# Patient Record
Sex: Female | Born: 1955
Health system: Southern US, Community
[De-identification: ages and names within clinical notes are randomized; demographics above are authoritative.]

## PROBLEM LIST (undated history)

## (undated) DIAGNOSIS — N952 Postmenopausal atrophic vaginitis: Secondary | ICD-10-CM

## (undated) DIAGNOSIS — J45909 Unspecified asthma, uncomplicated: Secondary | ICD-10-CM

## (undated) DIAGNOSIS — A048 Other specified bacterial intestinal infections: Secondary | ICD-10-CM

## (undated) DIAGNOSIS — R7303 Prediabetes: Secondary | ICD-10-CM

## (undated) DIAGNOSIS — L80 Vitiligo: Secondary | ICD-10-CM

## (undated) DIAGNOSIS — E559 Vitamin D deficiency, unspecified: Secondary | ICD-10-CM

## (undated) DIAGNOSIS — Z8632 Personal history of gestational diabetes: Secondary | ICD-10-CM

## (undated) DIAGNOSIS — B4489 Other forms of aspergillosis: Secondary | ICD-10-CM

## (undated) DIAGNOSIS — IMO0001 Reserved for inherently not codable concepts without codable children: Secondary | ICD-10-CM

## (undated) DIAGNOSIS — M8580 Other specified disorders of bone density and structure, unspecified site: Secondary | ICD-10-CM

## (undated) DIAGNOSIS — K219 Gastro-esophageal reflux disease without esophagitis: Secondary | ICD-10-CM

## (undated) HISTORY — DX: Other specified disorders of bone density and structure, unspecified site: M85.80

## (undated) HISTORY — DX: Vitiligo: L80

## (undated) HISTORY — DX: Postmenopausal atrophic vaginitis: N95.2

## (undated) HISTORY — DX: Reserved for inherently not codable concepts without codable children: IMO0001

## (undated) HISTORY — DX: Prediabetes: R73.03

## (undated) HISTORY — DX: Other specified bacterial intestinal infections: A04.8

## (undated) HISTORY — DX: Other forms of aspergillosis: B44.89

## (undated) HISTORY — DX: Vitamin D deficiency, unspecified: E55.9

## (undated) HISTORY — DX: Gastro-esophageal reflux disease without esophagitis: K21.9

## (undated) HISTORY — DX: Personal history of gestational diabetes: Z86.32

---

## 1988-06-27 HISTORY — PX: BREAST CYST EXCISION: SHX579

## 1993-06-27 HISTORY — PX: TUBAL LIGATION: SHX77

## 1998-02-05 ENCOUNTER — Other Ambulatory Visit: Admission: RE | Admit: 1998-02-05 | Discharge: 1998-02-05 | Payer: Self-pay | Admitting: Obstetrics and Gynecology

## 1998-02-05 ENCOUNTER — Other Ambulatory Visit: Admission: RE | Admit: 1998-02-05 | Discharge: 1998-02-05 | Payer: Self-pay | Admitting: Gynecology

## 1998-12-18 ENCOUNTER — Encounter: Payer: Self-pay | Admitting: Orthopedic Surgery

## 1998-12-18 ENCOUNTER — Ambulatory Visit (HOSPITAL_COMMUNITY): Admission: RE | Admit: 1998-12-18 | Discharge: 1998-12-18 | Payer: Self-pay | Admitting: Orthopedic Surgery

## 1999-02-16 ENCOUNTER — Other Ambulatory Visit: Admission: RE | Admit: 1999-02-16 | Discharge: 1999-02-16 | Payer: Self-pay | Admitting: Gynecology

## 2000-04-24 ENCOUNTER — Encounter: Payer: Self-pay | Admitting: Gynecology

## 2000-04-24 ENCOUNTER — Encounter: Admission: RE | Admit: 2000-04-24 | Discharge: 2000-04-24 | Payer: Self-pay | Admitting: Gynecology

## 2000-06-05 ENCOUNTER — Other Ambulatory Visit: Admission: RE | Admit: 2000-06-05 | Discharge: 2000-06-05 | Payer: Self-pay | Admitting: Gynecology

## 2001-05-09 ENCOUNTER — Encounter: Payer: Self-pay | Admitting: Gynecology

## 2001-05-09 ENCOUNTER — Encounter: Admission: RE | Admit: 2001-05-09 | Discharge: 2001-05-09 | Payer: Self-pay | Admitting: Gynecology

## 2001-06-07 ENCOUNTER — Other Ambulatory Visit: Admission: RE | Admit: 2001-06-07 | Discharge: 2001-06-07 | Payer: Self-pay | Admitting: Gynecology

## 2002-06-12 ENCOUNTER — Encounter: Payer: Self-pay | Admitting: Gynecology

## 2002-06-12 ENCOUNTER — Encounter: Admission: RE | Admit: 2002-06-12 | Discharge: 2002-06-12 | Payer: Self-pay | Admitting: Gynecology

## 2002-06-25 ENCOUNTER — Other Ambulatory Visit: Admission: RE | Admit: 2002-06-25 | Discharge: 2002-06-25 | Payer: Self-pay | Admitting: Gynecology

## 2003-07-23 ENCOUNTER — Other Ambulatory Visit: Admission: RE | Admit: 2003-07-23 | Discharge: 2003-07-23 | Payer: Self-pay | Admitting: Gynecology

## 2003-10-08 ENCOUNTER — Encounter: Admission: RE | Admit: 2003-10-08 | Discharge: 2003-10-08 | Payer: Self-pay | Admitting: Gynecology

## 2004-08-31 ENCOUNTER — Other Ambulatory Visit: Admission: RE | Admit: 2004-08-31 | Discharge: 2004-08-31 | Payer: Self-pay | Admitting: Gynecology

## 2004-11-05 ENCOUNTER — Encounter: Admission: RE | Admit: 2004-11-05 | Discharge: 2004-11-05 | Payer: Self-pay | Admitting: Gynecology

## 2005-03-09 ENCOUNTER — Encounter: Admission: RE | Admit: 2005-03-09 | Discharge: 2005-03-09 | Payer: Self-pay | Admitting: Family Medicine

## 2005-04-28 ENCOUNTER — Encounter: Admission: RE | Admit: 2005-04-28 | Discharge: 2005-04-28 | Payer: Self-pay | Admitting: Otolaryngology

## 2005-09-12 ENCOUNTER — Other Ambulatory Visit: Admission: RE | Admit: 2005-09-12 | Discharge: 2005-09-12 | Payer: Self-pay | Admitting: Gynecology

## 2006-01-23 ENCOUNTER — Encounter: Admission: RE | Admit: 2006-01-23 | Discharge: 2006-01-23 | Payer: Self-pay | Admitting: Gynecology

## 2006-09-14 ENCOUNTER — Other Ambulatory Visit: Admission: RE | Admit: 2006-09-14 | Discharge: 2006-09-14 | Payer: Self-pay | Admitting: Gynecology

## 2007-02-01 ENCOUNTER — Encounter: Admission: RE | Admit: 2007-02-01 | Discharge: 2007-02-01 | Payer: Self-pay | Admitting: Gynecology

## 2007-06-28 HISTORY — PX: COLPOSCOPY: SHX161

## 2007-09-19 ENCOUNTER — Other Ambulatory Visit: Admission: RE | Admit: 2007-09-19 | Discharge: 2007-09-19 | Payer: Self-pay | Admitting: Gynecology

## 2007-12-19 ENCOUNTER — Other Ambulatory Visit: Admission: RE | Admit: 2007-12-19 | Discharge: 2007-12-19 | Payer: Self-pay | Admitting: Surgical Oncology

## 2008-02-04 ENCOUNTER — Encounter: Admission: RE | Admit: 2008-02-04 | Discharge: 2008-02-04 | Payer: Self-pay | Admitting: Gynecology

## 2008-07-07 ENCOUNTER — Ambulatory Visit: Payer: Self-pay | Admitting: Gynecology

## 2008-07-23 ENCOUNTER — Ambulatory Visit: Payer: Self-pay | Admitting: Gynecology

## 2008-10-08 ENCOUNTER — Encounter: Admission: RE | Admit: 2008-10-08 | Discharge: 2008-10-08 | Payer: Self-pay | Admitting: Family Medicine

## 2008-10-13 ENCOUNTER — Encounter: Payer: Self-pay | Admitting: Gynecology

## 2008-10-13 ENCOUNTER — Other Ambulatory Visit: Admission: RE | Admit: 2008-10-13 | Discharge: 2008-10-13 | Payer: Self-pay | Admitting: Gynecology

## 2008-10-13 ENCOUNTER — Ambulatory Visit: Payer: Self-pay | Admitting: Gynecology

## 2009-02-06 ENCOUNTER — Encounter: Admission: RE | Admit: 2009-02-06 | Discharge: 2009-02-06 | Payer: Self-pay | Admitting: Family Medicine

## 2009-03-26 ENCOUNTER — Encounter: Admission: RE | Admit: 2009-03-26 | Discharge: 2009-03-26 | Payer: Self-pay | Admitting: Gynecology

## 2009-11-16 ENCOUNTER — Ambulatory Visit: Payer: Self-pay | Admitting: Gynecology

## 2009-11-16 ENCOUNTER — Other Ambulatory Visit: Admission: RE | Admit: 2009-11-16 | Discharge: 2009-11-16 | Payer: Self-pay | Admitting: Gynecology

## 2010-03-29 ENCOUNTER — Encounter: Admission: RE | Admit: 2010-03-29 | Discharge: 2010-03-29 | Payer: Self-pay | Admitting: Gynecology

## 2010-05-10 ENCOUNTER — Ambulatory Visit: Payer: Self-pay | Admitting: Gynecology

## 2010-05-17 ENCOUNTER — Ambulatory Visit: Payer: Self-pay | Admitting: Gynecology

## 2010-05-21 ENCOUNTER — Inpatient Hospital Stay (HOSPITAL_COMMUNITY)
Admission: EM | Admit: 2010-05-21 | Discharge: 2010-05-25 | Payer: Self-pay | Source: Home / Self Care | Admitting: Emergency Medicine

## 2010-06-07 ENCOUNTER — Encounter
Admission: RE | Admit: 2010-06-07 | Discharge: 2010-06-07 | Payer: Self-pay | Source: Home / Self Care | Attending: Family Medicine | Admitting: Family Medicine

## 2010-09-07 LAB — CBC
HCT: 34.9 % — ABNORMAL LOW (ref 36.0–46.0)
HCT: 35.4 % — ABNORMAL LOW (ref 36.0–46.0)
HCT: 35.7 % — ABNORMAL LOW (ref 36.0–46.0)
HCT: 40.7 % (ref 36.0–46.0)
Hemoglobin: 11.4 g/dL — ABNORMAL LOW (ref 12.0–15.0)
Hemoglobin: 11.7 g/dL — ABNORMAL LOW (ref 12.0–15.0)
Hemoglobin: 11.8 g/dL — ABNORMAL LOW (ref 12.0–15.0)
Hemoglobin: 13.9 g/dL (ref 12.0–15.0)
MCH: 29.6 pg (ref 26.0–34.0)
MCH: 29.8 pg (ref 26.0–34.0)
MCH: 29.9 pg (ref 26.0–34.0)
MCH: 30.4 pg (ref 26.0–34.0)
MCHC: 32.7 g/dL (ref 30.0–36.0)
MCHC: 33.1 g/dL (ref 30.0–36.0)
MCHC: 33.1 g/dL (ref 30.0–36.0)
MCHC: 34.2 g/dL (ref 30.0–36.0)
MCV: 89.1 fL (ref 78.0–100.0)
MCV: 89.7 fL (ref 78.0–100.0)
MCV: 90.5 fL (ref 78.0–100.0)
MCV: 91.4 fL (ref 78.0–100.0)
Platelets: 265 10*3/uL (ref 150–400)
Platelets: 289 10*3/uL (ref 150–400)
Platelets: 297 10*3/uL (ref 150–400)
Platelets: 328 10*3/uL (ref 150–400)
RBC: 3.82 MIL/uL — ABNORMAL LOW (ref 3.87–5.11)
RBC: 3.91 MIL/uL (ref 3.87–5.11)
RBC: 3.98 MIL/uL (ref 3.87–5.11)
RBC: 4.57 MIL/uL (ref 3.87–5.11)
RDW: 12.3 % (ref 11.5–15.5)
RDW: 12.4 % (ref 11.5–15.5)
RDW: 12.5 % (ref 11.5–15.5)
RDW: 12.7 % (ref 11.5–15.5)
WBC: 3 10*3/uL — ABNORMAL LOW (ref 4.0–10.5)
WBC: 4.9 10*3/uL (ref 4.0–10.5)
WBC: 5.2 10*3/uL (ref 4.0–10.5)
WBC: 7.1 10*3/uL (ref 4.0–10.5)

## 2010-09-07 LAB — DIFFERENTIAL
Basophils Absolute: 0 10*3/uL (ref 0.0–0.1)
Basophils Relative: 1 % (ref 0–1)
Eosinophils Absolute: 0 10*3/uL (ref 0.0–0.7)
Eosinophils Relative: 0 % (ref 0–5)
Lymphocytes Relative: 9 % — ABNORMAL LOW (ref 12–46)
Lymphs Abs: 0.7 10*3/uL (ref 0.7–4.0)
Monocytes Absolute: 0.6 10*3/uL (ref 0.1–1.0)
Monocytes Relative: 8 % (ref 3–12)
Neutro Abs: 5.8 10*3/uL (ref 1.7–7.7)
Neutrophils Relative %: 81 % — ABNORMAL HIGH (ref 43–77)

## 2010-09-07 LAB — BASIC METABOLIC PANEL
BUN: 1 mg/dL — ABNORMAL LOW (ref 6–23)
BUN: 3 mg/dL — ABNORMAL LOW (ref 6–23)
BUN: 4 mg/dL — ABNORMAL LOW (ref 6–23)
BUN: 6 mg/dL (ref 6–23)
BUN: 7 mg/dL (ref 6–23)
CO2: 28 mEq/L (ref 19–32)
CO2: 30 mEq/L (ref 19–32)
CO2: 31 mEq/L (ref 19–32)
CO2: 32 mEq/L (ref 19–32)
CO2: 32 mEq/L (ref 19–32)
Calcium: 8.3 mg/dL — ABNORMAL LOW (ref 8.4–10.5)
Calcium: 8.4 mg/dL (ref 8.4–10.5)
Calcium: 8.5 mg/dL (ref 8.4–10.5)
Calcium: 8.7 mg/dL (ref 8.4–10.5)
Calcium: 8.8 mg/dL (ref 8.4–10.5)
Chloride: 104 mEq/L (ref 96–112)
Chloride: 105 mEq/L (ref 96–112)
Chloride: 105 mEq/L (ref 96–112)
Chloride: 108 mEq/L (ref 96–112)
Chloride: 97 mEq/L (ref 96–112)
Creatinine, Ser: 0.5 mg/dL (ref 0.4–1.2)
Creatinine, Ser: 0.56 mg/dL (ref 0.4–1.2)
Creatinine, Ser: 0.59 mg/dL (ref 0.4–1.2)
Creatinine, Ser: 0.6 mg/dL (ref 0.4–1.2)
Creatinine, Ser: 0.63 mg/dL (ref 0.4–1.2)
GFR calc Af Amer: >60 mL/min (ref >60)
GFR calc Af Amer: >60 mL/min (ref >60)
GFR calc Af Amer: >60 mL/min (ref >60)
GFR calc Af Amer: >60 mL/min (ref >60)
GFR calc Af Amer: >60 mL/min (ref >60)
GFR calc non Af Amer: >60 mL/min (ref >60)
GFR calc non Af Amer: >60 mL/min (ref >60)
GFR calc non Af Amer: >60 mL/min (ref >60)
GFR calc non Af Amer: >60 mL/min (ref >60)
GFR calc non Af Amer: >60 mL/min (ref >60)
Glucose, Bld: 101 mg/dL — ABNORMAL HIGH (ref 70–99)
Glucose, Bld: 106 mg/dL — ABNORMAL HIGH (ref 70–99)
Glucose, Bld: 112 mg/dL — ABNORMAL HIGH (ref 70–99)
Glucose, Bld: 112 mg/dL — ABNORMAL HIGH (ref 70–99)
Glucose, Bld: 116 mg/dL — ABNORMAL HIGH (ref 70–99)
Potassium: 3.4 mEq/L — ABNORMAL LOW (ref 3.5–5.1)
Potassium: 4 mEq/L (ref 3.5–5.1)
Potassium: 4 mEq/L (ref 3.5–5.1)
Potassium: 4 mEq/L (ref 3.5–5.1)
Potassium: 4.2 mEq/L (ref 3.5–5.1)
Sodium: 137 mEq/L (ref 135–145)
Sodium: 139 mEq/L (ref 135–145)
Sodium: 140 mEq/L (ref 135–145)
Sodium: 140 mEq/L (ref 135–145)
Sodium: 142 mEq/L (ref 135–145)

## 2010-09-07 LAB — URINALYSIS, ROUTINE W REFLEX MICROSCOPIC
Bilirubin Urine: NEGATIVE
Glucose, UA: NEGATIVE mg/dL
Ketones, ur: NEGATIVE mg/dL
Leukocytes, UA: NEGATIVE
Nitrite: NEGATIVE
Protein, ur: 30 mg/dL — AB
Specific Gravity, Urine: 1.019 (ref 1.005–1.030)
Urobilinogen, UA: 1 mg/dL (ref 0.0–1.0)
pH: 6 (ref 5.0–8.0)

## 2010-09-07 LAB — MAGNESIUM
Magnesium: 2.2 mg/dL (ref 1.5–2.5)
Magnesium: 2.3 mg/dL (ref 1.5–2.5)
Magnesium: 2.4 mg/dL (ref 1.5–2.5)

## 2010-09-07 LAB — HEPATIC FUNCTION PANEL
ALT: 14 U/L (ref 0–35)
AST: 12 U/L (ref 0–37)
Albumin: 2.8 g/dL — ABNORMAL LOW (ref 3.5–5.2)
Alkaline Phosphatase: 50 U/L (ref 39–117)
Bilirubin, Direct: 0.1 mg/dL (ref 0.0–0.3)
Indirect Bilirubin: 0.2 mg/dL — ABNORMAL LOW (ref 0.3–0.9)
Total Bilirubin: 0.3 mg/dL (ref 0.3–1.2)
Total Protein: 6.1 g/dL (ref 6.0–8.3)

## 2010-09-07 LAB — URINE CULTURE
Colony Count: NO GROWTH
Culture  Setup Time: 201111251751
Culture: NO GROWTH

## 2010-09-07 LAB — URINE MICROSCOPIC-ADD ON

## 2010-09-07 LAB — MRSA PCR SCREENING: MRSA by PCR: NEGATIVE

## 2010-09-07 LAB — LIPASE, BLOOD: Lipase: 32 U/L (ref 11–59)

## 2010-11-24 ENCOUNTER — Other Ambulatory Visit (HOSPITAL_COMMUNITY)
Admission: RE | Admit: 2010-11-24 | Discharge: 2010-11-24 | Disposition: A | Discharge status: Home / Self Care | Payer: BC Managed Care – PPO | Source: Ambulatory Visit | Attending: Gynecology | Admitting: Gynecology

## 2010-11-24 ENCOUNTER — Encounter (INDEPENDENT_AMBULATORY_CARE_PROVIDER_SITE_OTHER): Payer: BC Managed Care – PPO | Admitting: Women's Health

## 2010-11-24 ENCOUNTER — Other Ambulatory Visit: Payer: Self-pay | Admitting: Women's Health

## 2010-11-24 DIAGNOSIS — E079 Disorder of thyroid, unspecified: Secondary | ICD-10-CM

## 2010-11-24 DIAGNOSIS — Z124 Encounter for screening for malignant neoplasm of cervix: Secondary | ICD-10-CM | POA: Insufficient documentation

## 2010-11-24 DIAGNOSIS — R823 Hemoglobinuria: Secondary | ICD-10-CM

## 2010-11-24 DIAGNOSIS — Z01419 Encounter for gynecological examination (general) (routine) without abnormal findings: Secondary | ICD-10-CM

## 2010-11-24 DIAGNOSIS — Z13220 Encounter for screening for lipoid disorders: Secondary | ICD-10-CM

## 2010-11-24 DIAGNOSIS — Z833 Family history of diabetes mellitus: Secondary | ICD-10-CM

## 2010-12-09 ENCOUNTER — Encounter (INDEPENDENT_AMBULATORY_CARE_PROVIDER_SITE_OTHER): Payer: BC Managed Care – PPO

## 2010-12-09 DIAGNOSIS — M899 Disorder of bone, unspecified: Secondary | ICD-10-CM

## 2010-12-10 ENCOUNTER — Other Ambulatory Visit (INDEPENDENT_AMBULATORY_CARE_PROVIDER_SITE_OTHER): Payer: BC Managed Care – PPO

## 2010-12-10 DIAGNOSIS — E559 Vitamin D deficiency, unspecified: Secondary | ICD-10-CM

## 2010-12-21 ENCOUNTER — Institutional Professional Consult (permissible substitution) (INDEPENDENT_AMBULATORY_CARE_PROVIDER_SITE_OTHER): Payer: BC Managed Care – PPO | Admitting: Gynecology

## 2010-12-21 DIAGNOSIS — M899 Disorder of bone, unspecified: Secondary | ICD-10-CM

## 2010-12-21 DIAGNOSIS — M949 Disorder of cartilage, unspecified: Secondary | ICD-10-CM

## 2011-01-07 ENCOUNTER — Other Ambulatory Visit: Payer: BC Managed Care – PPO

## 2011-04-18 ENCOUNTER — Other Ambulatory Visit: Payer: Self-pay | Admitting: Gynecology

## 2011-04-18 DIAGNOSIS — Z1231 Encounter for screening mammogram for malignant neoplasm of breast: Secondary | ICD-10-CM

## 2011-05-02 ENCOUNTER — Ambulatory Visit
Admission: RE | Admit: 2011-05-02 | Discharge: 2011-05-02 | Disposition: A | Discharge status: Home / Self Care | Payer: BC Managed Care – PPO | Source: Ambulatory Visit | Attending: Gynecology | Admitting: Gynecology

## 2011-05-02 DIAGNOSIS — Z1231 Encounter for screening mammogram for malignant neoplasm of breast: Secondary | ICD-10-CM

## 2011-08-07 IMAGING — CR DG CHEST 2V
2 series · 2 of 2 positions shown · non-contrast
Comparison: Chest x-ray of 05/24/2010

CLINICAL DATA: History pneumonia, follow-up

CHEST - 2 VIEW

[view not recorded (1 of 2)]
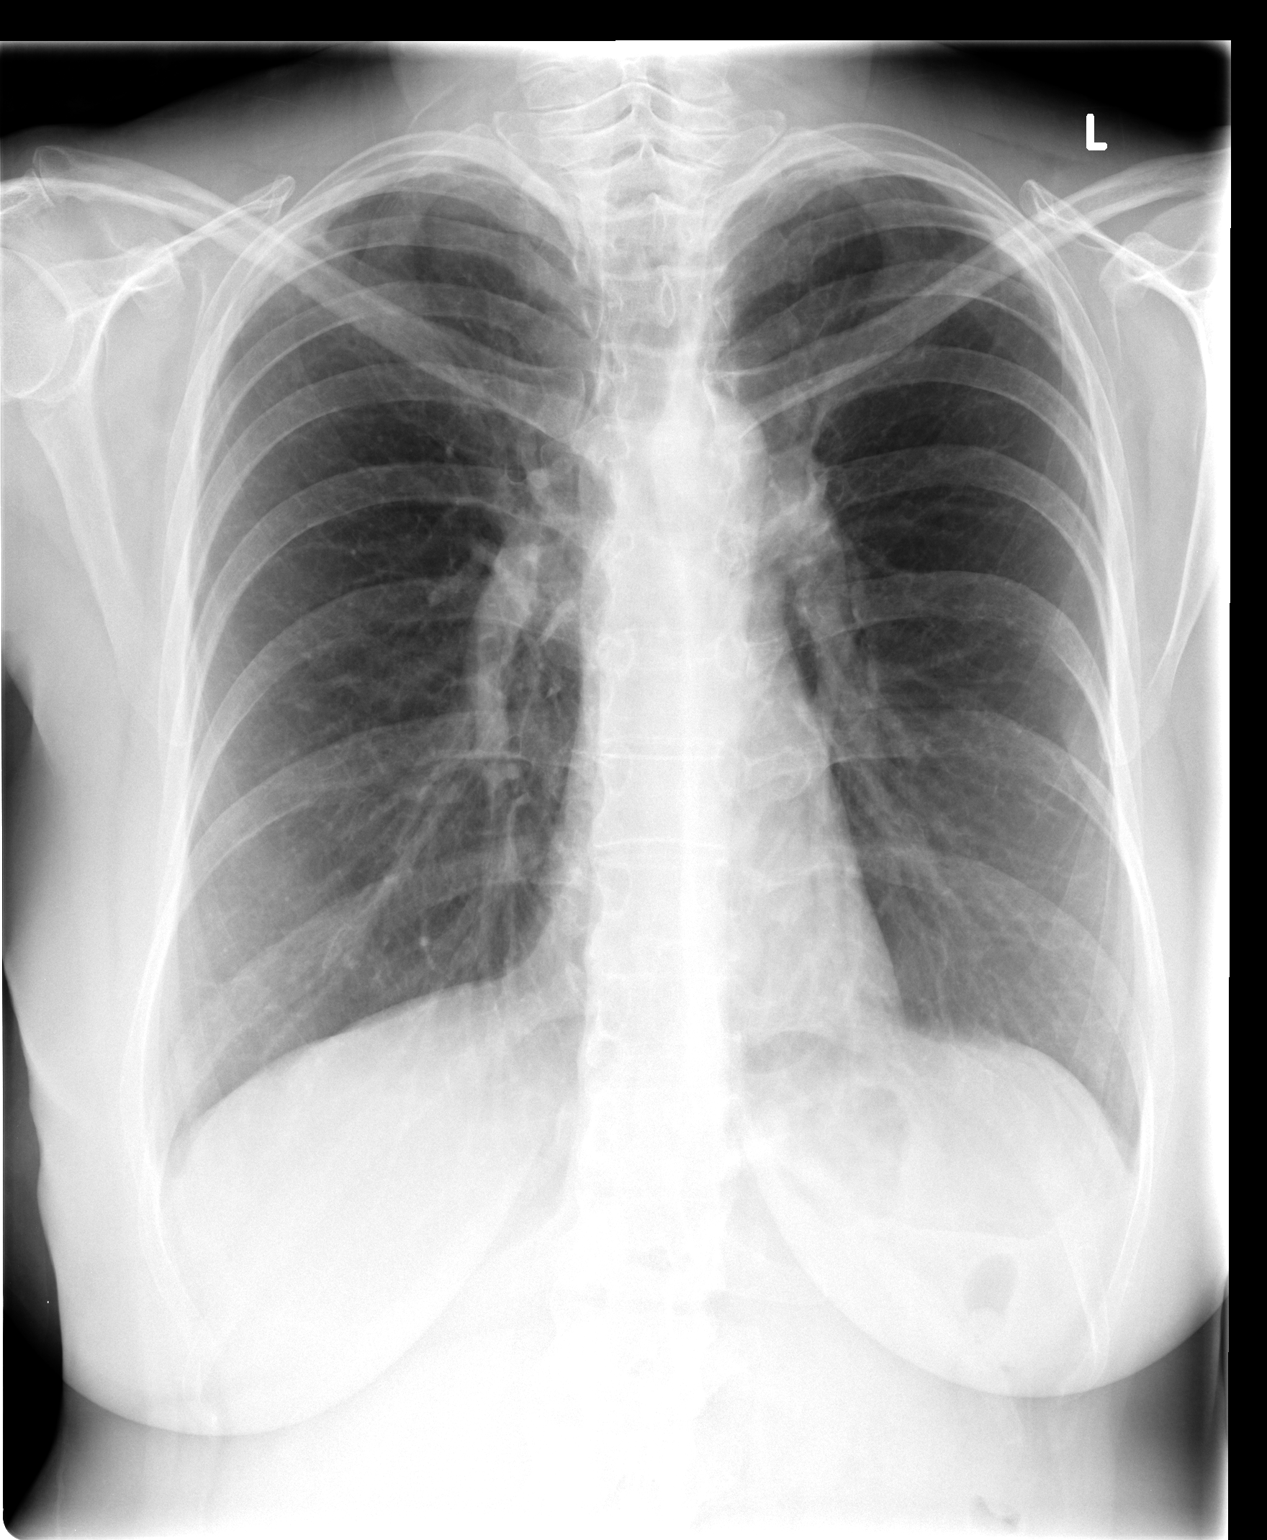

[view not recorded (2 of 2)]
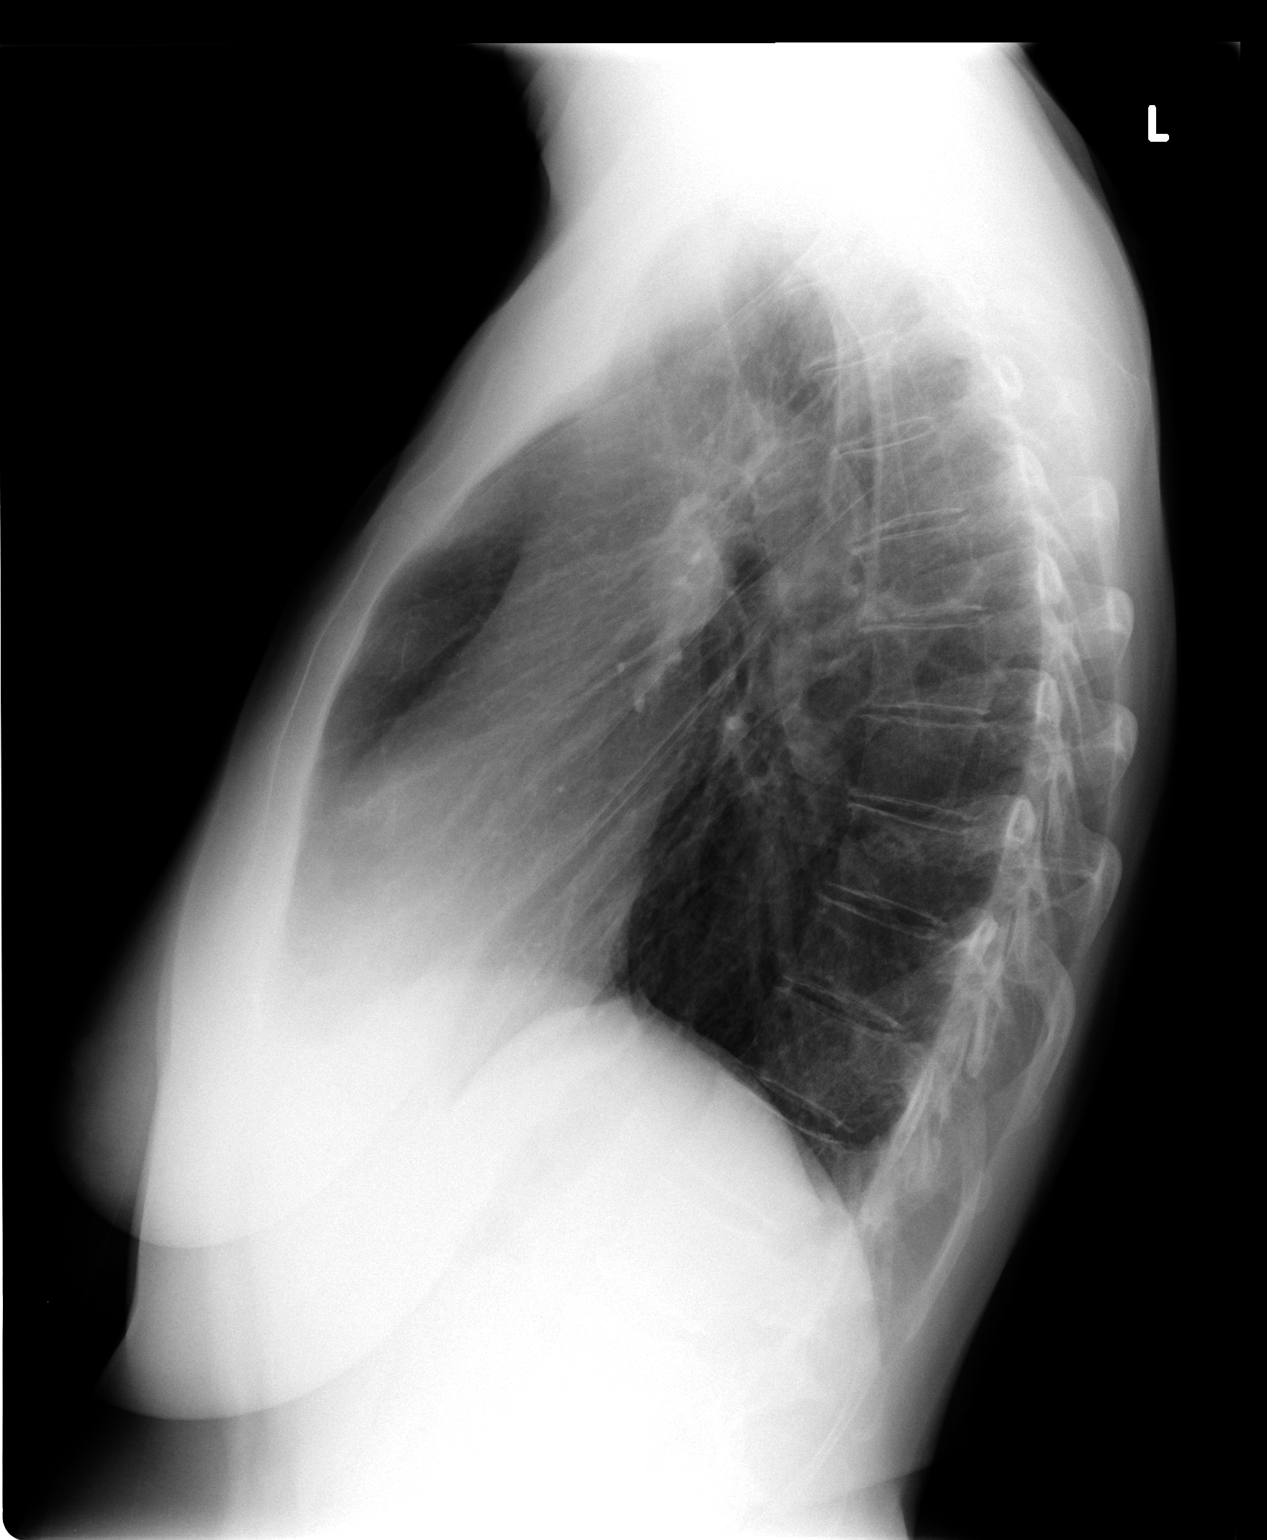

[2 of 2 positions shown; findings below may reference images not displayed]

FINDINGS: The lungs are now clear.  Vague opacity at the right lung
base has cleared as have the small pleural effusions.  Mediastinal
contours are stable.  Heart size is stable.  No bony abnormality is
seen.
IMPRESSION: Clearing of basilar opacity and small effusions.  No active lung
disease.

## 2012-02-03 ENCOUNTER — Encounter: Payer: BC Managed Care – PPO | Admitting: Women's Health

## 2012-02-08 ENCOUNTER — Ambulatory Visit (INDEPENDENT_AMBULATORY_CARE_PROVIDER_SITE_OTHER): Payer: BC Managed Care – PPO | Admitting: Women's Health

## 2012-02-08 ENCOUNTER — Encounter: Payer: Self-pay | Admitting: Gynecology

## 2012-02-08 ENCOUNTER — Encounter: Payer: Self-pay | Admitting: Women's Health

## 2012-02-08 VITALS — BP 106/70 | Ht 67.75 in | Wt 157.0 lb

## 2012-02-08 DIAGNOSIS — Z01419 Encounter for gynecological examination (general) (routine) without abnormal findings: Secondary | ICD-10-CM

## 2012-02-08 DIAGNOSIS — E079 Disorder of thyroid, unspecified: Secondary | ICD-10-CM

## 2012-02-08 DIAGNOSIS — Z13220 Encounter for screening for lipoid disorders: Secondary | ICD-10-CM

## 2012-02-08 DIAGNOSIS — Z833 Family history of diabetes mellitus: Secondary | ICD-10-CM

## 2012-02-08 LAB — CBC WITH DIFFERENTIAL/PLATELET
Basophils Absolute: 0.1 10*3/uL (ref 0.0–0.1)
Basophils Relative: 1 % (ref 0–1)
Eosinophils Absolute: 0.2 10*3/uL (ref 0.0–0.7)
Eosinophils Relative: 5 % (ref 0–5)
HCT: 40.5 % (ref 36.0–46.0)
Hemoglobin: 13.8 g/dL (ref 12.0–15.0)
Lymphocytes Relative: 31 % (ref 12–46)
Lymphs Abs: 1.2 10*3/uL (ref 0.7–4.0)
MCH: 30.1 pg (ref 26.0–34.0)
MCHC: 34.1 g/dL (ref 30.0–36.0)
MCV: 88.4 fL (ref 78.0–100.0)
Monocytes Absolute: 0.5 10*3/uL (ref 0.1–1.0)
Monocytes Relative: 12 % (ref 3–12)
Neutro Abs: 2 10*3/uL (ref 1.7–7.7)
Neutrophils Relative %: 51 % (ref 43–77)
Platelets: 351 10*3/uL (ref 150–400)
RBC: 4.58 MIL/uL (ref 3.87–5.11)
RDW: 13.2 % (ref 11.5–15.5)
WBC: 4 10*3/uL (ref 4.0–10.5)

## 2012-02-08 LAB — LIPID PANEL
Cholesterol: 218 mg/dL — ABNORMAL HIGH (ref 0–200)
HDL: 71 mg/dL (ref >39)
LDL Cholesterol: 107 mg/dL — ABNORMAL HIGH (ref 0–99)
Total CHOL/HDL Ratio: 3.1 Ratio
Triglycerides: 200 mg/dL — ABNORMAL HIGH (ref <150)
VLDL: 40 mg/dL (ref 0–40)

## 2012-02-08 LAB — GLUCOSE, RANDOM: Glucose, Bld: 69 mg/dL — ABNORMAL LOW (ref 70–99)

## 2012-02-08 NOTE — Progress Notes (Signed)
Kathryn Griffin May 12, 1956 [id number]    History:    The patient presents for annual exam.  Postmenopausal on no HRT with no bleeding. History of normal mammograms and Paps. Normal colonoscopy in 2010. DEXA 2012 osteopenia femoral neck neck T score -1.5. Problem of thinning eyebrows for several years, brother also has problem. No hair loss on head.  Had a normal TSH 2012.   Past medical history, past surgical history, family history and social history were all reviewed and documented in the EPIC chart. 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 Son Beaver graduated from [employer], daughter Social research officer, government at [employer].   ROS:  A  ROS was performed and pertinent positives and negatives are included in the history.  Exam:  Filed Vitals:   02/08/12 0928  BP: 106/70    General appearance:  Normal Head/Neck:  Normal, without cervical or supraclavicular adenopathy. Thyroid:  Symmetrical, normal in size, without palpable masses or nodularity. Respiratory  Effort:  Normal  Auscultation:  Clear without wheezing or rhonchi Cardiovascular  Auscultation:  Regular rate, without rubs, murmurs or gallops  Edema/varicosities:  Not grossly evident Abdominal  Soft,nontender, without masses, guarding or rebound.  Liver/spleen:  No organomegaly noted  Hernia:  None appreciated  Skin  Inspection:  Grossly normal  Palpation:  Grossly normal Neurologic/psychiatric  Orientation:  Normal with appropriate conversation.  Mood/affect:  Normal  Genitourinary    Breasts: Examined lying and sitting. Right breast larger than left always    Right: Without masses, retractions, discharge or axillary adenopathy.     Left: Without masses, retractions, discharge or axillary adenopathy.   Inguinal/mons:  Normal without inguinal adenopathy  External genitalia:  Normal  BUS/Urethra/Skene's glands:  Normal  Bladder:  Normal  Vagina:  Normal  Cervix:  Normal  Uterus:   normal in size, shape and  contour.  Midline and mobile  Adnexa/parametria:     Rt: Without masses or tenderness.   Lt: Without masses or tenderness.  Anus and perineum: Normal  Digital rectal exam: Normal sphincter tone without palpated masses or tenderness  Assessment/Plan:  56 y.o. M. WF G2 P2 for annual exam with no complaints.  Normal postmenopausal exam Osteopenia T score -1.5 femoral neck 2012  Plan: CBC, TSH, glucose, lipid panel, UA. Home Hemoccult card given. No Pap, history of normal Paps, new screening guidelines reviewed. SBE's, continue annual mammogram, calcium rich diet, vitamin D 2000 daily encouraged. Home safety and fall prevention reviewed, reviewed importance of regular exercise.    Harrington Challenger WHNP, 10:14 AM 02/08/2012

## 2012-02-08 NOTE — Patient Instructions (Signed)

## 2012-02-09 ENCOUNTER — Other Ambulatory Visit: Payer: Self-pay | Admitting: *Deleted

## 2012-02-09 DIAGNOSIS — E7800 Pure hypercholesterolemia, unspecified: Secondary | ICD-10-CM

## 2012-02-09 LAB — URINALYSIS W MICROSCOPIC + REFLEX CULTURE
Bacteria, UA: NONE SEEN
Bilirubin Urine: NEGATIVE
Casts: NONE SEEN
Crystals: NONE SEEN
Glucose, UA: NEGATIVE mg/dL
Hgb urine dipstick: NEGATIVE
Ketones, ur: NEGATIVE mg/dL
Leukocytes, UA: NEGATIVE
Nitrite: NEGATIVE
Protein, ur: NEGATIVE mg/dL
Specific Gravity, Urine: 1.014 (ref 1.005–1.030)
Squamous Epithelial / LPF: NONE SEEN
Urobilinogen, UA: 0.2 mg/dL (ref 0.0–1.0)
pH: 6 (ref 5.0–8.0)

## 2012-02-09 LAB — TSH: TSH: 3.314 u[IU]/mL (ref 0.350–4.500)

## 2012-02-14 ENCOUNTER — Encounter: Payer: Self-pay | Admitting: Gynecology

## 2012-02-14 ENCOUNTER — Other Ambulatory Visit: Payer: Self-pay | Admitting: *Deleted

## 2012-02-14 DIAGNOSIS — Z1211 Encounter for screening for malignant neoplasm of colon: Secondary | ICD-10-CM

## 2012-02-15 ENCOUNTER — Other Ambulatory Visit: Payer: Self-pay | Admitting: Gynecology

## 2012-02-15 DIAGNOSIS — Z1211 Encounter for screening for malignant neoplasm of colon: Secondary | ICD-10-CM

## 2012-05-07 ENCOUNTER — Encounter: Payer: Self-pay | Admitting: Women's Health

## 2012-06-06 ENCOUNTER — Other Ambulatory Visit: Payer: Self-pay | Admitting: Gynecology

## 2012-06-06 DIAGNOSIS — Z1231 Encounter for screening mammogram for malignant neoplasm of breast: Secondary | ICD-10-CM

## 2012-07-09 ENCOUNTER — Ambulatory Visit
Admission: RE | Admit: 2012-07-09 | Discharge: 2012-07-09 | Disposition: A | Discharge status: Home / Self Care | Payer: BC Managed Care – PPO | Source: Ambulatory Visit | Attending: Gynecology | Admitting: Gynecology

## 2012-07-09 DIAGNOSIS — Z1231 Encounter for screening mammogram for malignant neoplasm of breast: Secondary | ICD-10-CM

## 2012-08-21 ENCOUNTER — Other Ambulatory Visit: Payer: Self-pay | Admitting: Family Medicine

## 2012-08-21 ENCOUNTER — Ambulatory Visit
Admission: RE | Admit: 2012-08-21 | Discharge: 2012-08-21 | Disposition: A | Discharge status: Home / Self Care | Payer: BC Managed Care – PPO | Source: Ambulatory Visit | Attending: Family Medicine | Admitting: Family Medicine

## 2012-08-21 DIAGNOSIS — R05 Cough: Secondary | ICD-10-CM

## 2012-08-21 DIAGNOSIS — R059 Cough, unspecified: Secondary | ICD-10-CM

## 2012-08-21 DIAGNOSIS — R0602 Shortness of breath: Secondary | ICD-10-CM

## 2012-08-21 DIAGNOSIS — R062 Wheezing: Secondary | ICD-10-CM

## 2012-10-01 ENCOUNTER — Ambulatory Visit
Admission: RE | Admit: 2012-10-01 | Discharge: 2012-10-01 | Disposition: A | Discharge status: Home / Self Care | Payer: BC Managed Care – PPO | Source: Ambulatory Visit | Attending: Family Medicine | Admitting: Family Medicine

## 2012-10-01 ENCOUNTER — Other Ambulatory Visit: Payer: Self-pay | Admitting: Family Medicine

## 2012-10-01 DIAGNOSIS — R9389 Abnormal findings on diagnostic imaging of other specified body structures: Secondary | ICD-10-CM

## 2012-10-11 ENCOUNTER — Institutional Professional Consult (permissible substitution): Payer: BC Managed Care – PPO | Admitting: Internal Medicine

## 2012-10-15 ENCOUNTER — Encounter: Payer: Self-pay | Admitting: *Deleted

## 2012-10-15 ENCOUNTER — Ambulatory Visit (INDEPENDENT_AMBULATORY_CARE_PROVIDER_SITE_OTHER): Payer: BC Managed Care – PPO | Admitting: Internal Medicine

## 2012-10-15 ENCOUNTER — Encounter: Payer: Self-pay | Admitting: Internal Medicine

## 2012-10-15 ENCOUNTER — Other Ambulatory Visit (INDEPENDENT_AMBULATORY_CARE_PROVIDER_SITE_OTHER): Payer: BC Managed Care – PPO

## 2012-10-15 VITALS — BP 110/78 | HR 110 | Temp 98.0°F | Ht 67.0 in | Wt 161.0 lb

## 2012-10-15 DIAGNOSIS — R05 Cough: Secondary | ICD-10-CM

## 2012-10-15 DIAGNOSIS — R059 Cough, unspecified: Secondary | ICD-10-CM

## 2012-10-15 DIAGNOSIS — Z23 Encounter for immunization: Secondary | ICD-10-CM

## 2012-10-15 LAB — CBC WITH DIFFERENTIAL/PLATELET
Basophils Absolute: 0.1 10*3/uL (ref 0.0–0.1)
Basophils Relative: 1.2 % (ref 0.0–3.0)
Eosinophils Absolute: 0.6 10*3/uL (ref 0.0–0.7)
Eosinophils Relative: 10.8 % — ABNORMAL HIGH (ref 0.0–5.0)
HCT: 40.4 % (ref 36.0–46.0)
Hemoglobin: 13.8 g/dL (ref 12.0–15.0)
Lymphocytes Relative: 21.5 % (ref 12.0–46.0)
Lymphs Abs: 1.2 10*3/uL (ref 0.7–4.0)
MCHC: 34.1 g/dL (ref 30.0–36.0)
MCV: 89.5 fl (ref 78.0–100.0)
Monocytes Absolute: 0.5 10*3/uL (ref 0.1–1.0)
Monocytes Relative: 9.4 % (ref 3.0–12.0)
Neutro Abs: 3.1 10*3/uL (ref 1.4–7.7)
Neutrophils Relative %: 57.1 % (ref 43.0–77.0)
Platelets: 300 10*3/uL (ref 150.0–400.0)
RBC: 4.52 Mil/uL (ref 3.87–5.11)
RDW: 13.4 % (ref 11.5–14.6)
WBC: 5.4 10*3/uL (ref 4.5–10.5)

## 2012-10-15 LAB — BASIC METABOLIC PANEL
BUN: 16 mg/dL (ref 6–23)
CO2: 33 mEq/L — ABNORMAL HIGH (ref 19–32)
Calcium: 9.4 mg/dL (ref 8.4–10.5)
Chloride: 102 mEq/L (ref 96–112)
Creatinine, Ser: 0.7 mg/dL (ref 0.4–1.2)
GFR: 93.87 mL/min (ref >60.00)
Glucose, Bld: 94 mg/dL (ref 70–99)
Potassium: 4.4 mEq/L (ref 3.5–5.1)
Sodium: 140 mEq/L (ref 135–145)

## 2012-10-15 NOTE — Progress Notes (Signed)
  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 Better after rx with advair / prednsone  - coughs so hard chokes, even if not coughing still sob x inclines or steps better also p rx for asthma.  Also sense of pnds 24/7 not better p started rx including steroids.  No obvious daytime variabilty or assoc  cp or chest tightness, subjective wheeze overt sinus or hb symptoms (sees Magod though previously inconsistent with ppi, now on nexium daily) . No unusual exp hx or h/o childhood pna/ asthma or premature birth to her knowledge.    Also denies any obvious fluctuation of symptoms with weather or environmental changes or other aggravating or alleviating factors except as outlined above      Review of Systems  Constitutional: Negative for fever, chills and unexpected weight change.  HENT: Positive for congestion, trouble swallowing and postnasal drip. Negative for ear pain, nosebleeds, sore throat, rhinorrhea, sneezing, dental problem, voice change and sinus pressure.   Eyes: Negative for visual disturbance.  Respiratory: Positive for cough and shortness of breath. Negative for choking.   Cardiovascular: Negative for chest pain and leg swelling.  Gastrointestinal: Negative for vomiting, abdominal pain and diarrhea.  Genitourinary: Negative for difficulty urinating.  Musculoskeletal: Negative for arthralgias.  Skin: Negative for rash.  Neurological: Negative for  tremors, syncope and headaches.  Hematological: Does not bruise/bleed easily.       Objective:   Physical Exam Wt Readings from Last 3 Encounters:  10/15/12 161 lb (73.029 kg)  02/08/12 157 lb (71.215 kg)    HEENT: nl dentition, turbinates, and orophanx. Nl external ear canals without cough reflex   NECK :  without JVD/Nodes/TM/ nl carotid upstrokes bilaterally   LUNGS: no acc muscle use, clear to A and P bilaterally without cough on insp or exp maneuvers   CV:  RRR  no s3 or murmur or increase in P2, no edema   ABD:  soft and nontender with nl excursion in the supine position. No bruits or organomegaly, bowel sounds nl  MS:  warm without deformities, calf tenderness, cyanosis or clubbing  SKIN: warm and dry without lesions    NEURO:  alert, approp, no deficits    cxr 10/01/12   IMPRESSION:  1. No acute cardiopulmonary disease.  2. Lack of visualization of the previously questioned right  perihilar density, likely vascular summation shadows on that exam.  3. Mild chronic interstitial thickening which is nonspecific.  Correlate with chronic bronchitis symptoms      Assessment & Plan:

## 2012-10-15 NOTE — Assessment & Plan Note (Addendum)
The most common causes of chronic cough in immunocompetent adults include the following: upper airway cough syndrome (UACS), previously referred to as postnasal drip syndrome (PNDS), which is caused by variety of rhinosinus conditions; (2) asthma; (3) GERD; (4) chronic bronchitis from cigarette smoking or other inhaled environmental irritants; (5) nonasthmatic eosinophilic bronchitis; and (6) bronchiectasis.   These conditions, singly or in combination, have accounted for up to 94% of the causes of chronic cough in prospective studies.   Other conditions have constituted no >6% of the causes in prospective studies These have included bronchogenic carcinoma, chronic interstitial pneumonia, sarcoidosis, left ventricular failure, ACEI-induced cough, and aspiration from a condition associated with pharyngeal dysfunction.    Chronic cough is often simultaneously caused by more than one condition. A single cause has been found from 38 to 82% of the time, multiple causes from 18 to 62%. Multiply caused cough has been the result of three diseases up to 42% of the time.      Most likely this is  A form of Upper airway cough syndrome, so named because it's frequently impossible to sort out how much is  CR/sinusitis with freq throat clearing (which can be related to primary GERD)   vs  causing  secondary (" extra esophageal")  GERD from wide swings in gastric pressure that occur with throat clearing, often  promoting self use of mint and menthol lozenges that reduce the lower esophageal sphincter tone and exacerbate the problem further in a cyclical fashion.   These are the same pts (now being labeled as having "irritable larynx syndrome" by some cough centers) who not infrequently have a history of having failed to tolerate ace inhibitors   dry powder inhalers  ( which tend to make pt feel like they have excess pnds as may be the case here) or biphosphonates or report having atypical reflux symptoms (for which she  already sees Magod) that don't respond to standard doses of PPI , and are easily confused as having aecopd or asthma flares by even experienced allergists/ pulmonologists.   However, coughing up casts also suggestive of sinusitis/ bronchiectasis syndrome.  For now try off advair and add back if symptoms worsen.

## 2012-10-15 NOTE — Patient Instructions (Addendum)
Stop the advair in one week and if symptoms worse start it back up  You may have bronchiectasis from your previous pneumona  and what you coughed up is cast and we need to rule out ABPA  mucinex dm is best choice or delsym but not both  Continue nexium 40 mg Take 30-60 min before first meal of the day and add pepcid 20 mg one at bedtime  GERD (REFLUX)  is an extremely common cause of respiratory symptoms, many times with no significant heartburn at all.    It can be treated with medication, but also with lifestyle changes including avoidance of late meals, excessive alcohol, smoking cessation, and avoid fatty foods, chocolate, peppermint, colas, red wine, and acidic juices such as orange juice.  NO MINT OR MENTHOL PRODUCTS SO NO COUGH DROPS  USE SUGARLESS CANDY INSTEAD (jolley ranchers or Stover's)  NO OIL BASED VITAMINS - use powdered substitutes.    Please remember to go to the lab  department downstairs for your tests - we will call you with the results when they are available.  Follow up in 2 weeks either way.

## 2012-10-17 ENCOUNTER — Encounter: Payer: Self-pay | Admitting: Internal Medicine

## 2012-10-17 LAB — ALLERGY PROFILE REGION II-DC, DE, MD, ~~LOC~~, VA
Allergen, D pternoyssinus,d7: <0.1 kU/L
Alternaria Alternata: <0.1 kU/L
Aspergillus fumigatus, m3: 0.61 kU/L — ABNORMAL HIGH
Bermuda Grass: <0.1 kU/L
Box Elder IgE: <0.1 kU/L
Cat Dander: <0.1 kU/L
Cladosporium Herbarum: <0.1 kU/L
Cockroach: <0.1 kU/L
Common Ragweed: <0.1 kU/L
D. farinae: <0.1 kU/L
Dog Dander: <0.1 kU/L
Elm IgE: <0.1 kU/L
IgE (Immunoglobulin E), Serum: 44.2 IU/mL (ref 0.0–180.0)
Johnson Grass: <0.1 kU/L
Lamb's Quarters: <0.1 kU/L
Meadow Grass: <0.1 kU/L
Oak: <0.1 kU/L
Pecan/Hickory Tree IgE: <0.1 kU/L

## 2012-10-18 ENCOUNTER — Telehealth: Payer: Self-pay | Admitting: Internal Medicine

## 2012-10-18 ENCOUNTER — Telehealth: Payer: Self-pay | Admitting: *Deleted

## 2012-10-18 NOTE — Progress Notes (Signed)
Quick Note:  lmtcb on pt's home and cell #s ______ 

## 2012-10-18 NOTE — Telephone Encounter (Signed)
Pt would like to know calcium should she take? She has had problems with reflex and currently taking nexium and Pepcid. Please advise

## 2012-10-18 NOTE — Telephone Encounter (Signed)
According to result note pt was advised of results at 10:22. Nothing further needed.Carron Curie, CMA

## 2012-10-18 NOTE — Telephone Encounter (Signed)
Best calcium is in foods sources of dairy and dark green leafy vegetables foods. Some other research has shown calcium supplements are not as well absorbed as we once thought they would be. Can take an over-the-counter calcium supplement such as Os-Cal or Caltrate if so chooses opposite time of the Nexium and Pepcid. Also good to take an over-the-counter vitamin D 3 supplement 2000 mg daily. Vitamin D is difficult to get in foods sources other than wild caught salmon and sunlight. Vitamin D helps with absorption of calcium.

## 2012-10-22 ENCOUNTER — Telehealth: Payer: Self-pay | Admitting: Internal Medicine

## 2012-10-22 NOTE — Telephone Encounter (Signed)
Left message for pt to call.

## 2012-10-22 NOTE — Telephone Encounter (Signed)
LMOM for pt that there is nothing needed Call must have been made in error

## 2012-10-23 NOTE — Telephone Encounter (Signed)
Spoke with patient regarding the below and she is currently taking one 500 mgCaltrate daily along with one 600 mg multivitamin and calcium tablet, she asked if this is too much or just enough? She just would like to ensure enough for her bones. Please advise

## 2012-10-24 NOTE — Telephone Encounter (Signed)
Okay to take to calcium tablets, make sure she is taking 2000 total of vitamin D, most multivitamins do not have that much in them, and Os-Cal has 800mg  in each tablet

## 2012-10-25 NOTE — Telephone Encounter (Signed)
Left the below on pt voicemail. 

## 2012-10-29 ENCOUNTER — Encounter: Payer: Self-pay | Admitting: Internal Medicine

## 2012-10-29 ENCOUNTER — Telehealth: Payer: Self-pay | Admitting: Internal Medicine

## 2012-10-29 ENCOUNTER — Ambulatory Visit (INDEPENDENT_AMBULATORY_CARE_PROVIDER_SITE_OTHER): Payer: BC Managed Care – PPO | Admitting: Internal Medicine

## 2012-10-29 VITALS — BP 108/70 | HR 67 | Temp 97.8°F | Ht 67.5 in | Wt 159.0 lb

## 2012-10-29 DIAGNOSIS — R05 Cough: Secondary | ICD-10-CM

## 2012-10-29 DIAGNOSIS — R059 Cough, unspecified: Secondary | ICD-10-CM

## 2012-10-29 NOTE — Telephone Encounter (Signed)
I spoke with pt. She stated she had spiro done earlier. She stated she began to have problems with her "asthma". She stated she used her proair about 30 minutes ago and is beginning to feel better now. She stated earlier she coughed up a chunk of something but bc she had kids in the room it went right back down. Since that happened she has not had much wheezing and that is beginning to go away. I advised pt since she is feeling better to see how she is feeling the rest of the evening and if no better to give [place] a call tomorrow. She was fine with this. Nothing further was needed

## 2012-10-29 NOTE — Patient Instructions (Addendum)
Try zyrtec 10 mg at bedtime   to see if helps sensation of too much throat mucus and if not will need to stop it and see Dr Annalee Genta but stay on the reflux medication for now (if you want to try off wait six more weeks to stop acid suppression).

## 2012-10-29 NOTE — Progress Notes (Signed)
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 Better after rx with advair / prednsone  - coughs so hard chokes, even if not coughing still sob x inclines or steps better also p rx for asthma.  Also sense of pnds 24/7 not better p started rx including steroids. rec Stop the advair in one week and if symptoms worse start it back up You may have bronchiectasis from your previous pneumona  and what you coughed up is cast and we need to rule out ABPA> 10% eos but IgE only 44 mucinex dm is best choice or delsym but not both Continue nexium 40 mg Take 30-60 min before first meal of the day and add pepcid 20 mg one at bedtime GERD rx  10/29/2012 f/u ov/Kathryn Griffin re chronic cough maintaining off advair  Chief Complaint  Patient presents with  . Follow-up    Cough is much improved since last visit. Still has some occ prod cough with minimal clear sputum.    still sob trying to mow grass no change on advair vs off Has not tried saba yet.  Still has sense of too much mucus in throat while awake but not while sleeping   No obvious daytime variabilty or assoc  cp or chest tightness, subjective wheeze overt sinus or hb symptoms (sees Magod though previously inconsistent with ppi, now on nexium daily) . No unusual exp hx or h/o childhood pna/ asthma or premature birth to her knowledge.    Also denies any obvious fluctuation of symptoms with weather or environmental  changes or other aggravating or alleviating factors except as outlined above.  Current Medications, Allergies, Past Medical History, Past Surgical History, Family History, and Social History were reviewed in [employer] record.  ROS  The following are not active complaints unless bolded sore throat, dysphagia, dental problems, itching, sneezing,  nasal congestion or excess/ purulent secretions, ear ache,   fever, chills, sweats, unintended wt loss, pleuritic or exertional cp, hemoptysis,  orthopnea pnd or leg swelling, presyncope, palpitations, heartburn, abdominal pain, anorexia, nausea, vomiting, diarrhea  or change in bowel or urinary habits, change in stools or urine, dysuria,hematuria,  rash, arthralgias, visual complaints, headache, numbness weakness or ataxia or problems with walking or coordination,  change in mood/affect or memory.                Objective:   Physical Exam  10/29/2012        159 Wt Readings from Last 3 Encounters:  10/15/12 161 lb (73.029 kg)  02/08/12 157 lb (71.215 kg)    HEENT: nl dentition, turbinates,  Nl external ear canals without cough reflex - no evidence of excessive pnds but there is very mild cobblestoning of oropharynx    NECK :  without JVD/Nodes/TM/ nl carotid upstrokes bilaterally   LUNGS: no acc muscle use, clear to A and P bilaterally without cough on insp or exp  maneuvers   CV:  RRR  no s3 or murmur or increase in P2, no edema   ABD:  soft and nontender with nl excursion in the supine position. No bruits or organomegaly, bowel sounds nl  MS:  warm without deformities, calf tenderness, cyanosis or clubbing       cxr 10/01/12   IMPRESSION:  1. No acute cardiopulmonary disease.  2. Lack of visualization of the previously questioned right  perihilar density, likely vascular summation shadows on that exam.  3. Mild chronic interstitial thickening which is nonspecific.  Correlate with chronic bronchitis  symptoms      Assessment & Plan:

## 2012-10-30 NOTE — Assessment & Plan Note (Addendum)
-   Allergy profile 10/15/2012 > Eos 10% but IgE on 44 >  Pos aspergillis but minimal - Spirometry nl 10/29/12 off all inhalers/advair  Most likely this is  Classic Upper airway cough syndrome, so named because it's frequently impossible to sort out how much is  CR/sinusitis with freq throat clearing (which can be related to primary GERD)   vs  causing  secondary (" extra esophageal")  GERD from wide swings in gastric pressure that occur with throat clearing, often  promoting self use of mint and menthol lozenges that reduce the lower esophageal sphincter tone and exacerbate the problem further in a cyclical fashion.   These are the same pts (now being labeled as having "irritable larynx syndrome" by some cough centers) who not infrequently have a history of having failed to tolerate ace inhibitors,  dry powder inhalers like advair as is her case, or biphosphonates or report having atypical reflux symptoms that don't respond to standard doses of PPI , and are easily confused as having aecopd or asthma flares by even experienced allergists/ pulmonologists.   rec stay off advair, max gerd rx, try zyrtec for sensation of too much throat mucus but reviewed LPPR literature that describes her symptoms perfectly with f/u by shoemaker if sensation of too much throat mucus continues

## 2012-11-12 ENCOUNTER — Telehealth: Payer: Self-pay | Admitting: Internal Medicine

## 2012-11-12 DIAGNOSIS — K219 Gastro-esophageal reflux disease without esophagitis: Secondary | ICD-10-CM

## 2012-11-12 DIAGNOSIS — R059 Cough, unspecified: Secondary | ICD-10-CM

## 2012-11-12 DIAGNOSIS — R05 Cough: Secondary | ICD-10-CM

## 2012-11-12 NOTE — Telephone Encounter (Signed)
Pt is asking for a referral to Dr. Annalee Genta for reflux. Per last OV note pt instructions: Try zyrtec 10 mg at bedtime to see if helps sensation of too much throat mucus and if not will need to stop it and see Dr Annalee Genta but stay on the reflux medication for now (if you want to try off wait six more weeks to stop acid suppression).  Referral placed. Carron Curie, CMA

## 2012-11-12 NOTE — Telephone Encounter (Signed)
lmomtcb x1 

## 2012-11-12 NOTE — Telephone Encounter (Signed)
Pt returned call. Says she may be reached at school (have her paged) [phone]. Kathryn Griffin

## 2012-11-26 ENCOUNTER — Encounter: Payer: Self-pay | Admitting: Internal Medicine

## 2012-11-27 ENCOUNTER — Telehealth: Payer: Self-pay | Admitting: Internal Medicine

## 2012-11-27 NOTE — Telephone Encounter (Signed)
Ok to use albuterol every 3 hours if needed over next 24 h but must be seen in office 6/4 with all meds in hand and go to er in meantime if condition worsens

## 2012-11-27 NOTE — Telephone Encounter (Signed)
Left detailed msg per the pt's request I asked that she call back to let us know she got the msg and check to see how she is doing

## 2012-11-27 NOTE — Telephone Encounter (Signed)
Last OV 10-29-12. Pt states over the last 2 days she has been having some wheezing. She states it is minimal wheezing but is present. She states she has been using albuterol 4 hours over the last 2 days without relief of the wheezing. Pt states on Friday night she had a bad episode of reflux and had a bad cough but this subsided the next day, but that is when the wheezing started. Pt wanted to know what else she can do besides the albuterol. She is taking reflux med as directed. Also the pt saw Dr. Annalee Genta on 11-14-12. She denies any increase in cough, mucus, SOB at this time. Carron Curie, CMA No Known Allergies

## 2012-11-28 ENCOUNTER — Ambulatory Visit (INDEPENDENT_AMBULATORY_CARE_PROVIDER_SITE_OTHER): Payer: BC Managed Care – PPO | Admitting: Internal Medicine

## 2012-11-28 ENCOUNTER — Telehealth: Payer: Self-pay | Admitting: Internal Medicine

## 2012-11-28 ENCOUNTER — Encounter: Payer: Self-pay | Admitting: Internal Medicine

## 2012-11-28 VITALS — BP 100/60 | HR 66 | Temp 97.2°F | Ht 66.5 in | Wt 157.0 lb

## 2012-11-28 DIAGNOSIS — R059 Cough, unspecified: Secondary | ICD-10-CM

## 2012-11-28 DIAGNOSIS — R05 Cough: Secondary | ICD-10-CM

## 2012-11-28 MED ORDER — TRAMADOL HCL 50 MG PO TABS: ORAL_TABLET | ORAL | Status: DC

## 2012-11-28 MED ORDER — PREDNISONE (PAK) 10 MG PO TABS: ORAL_TABLET | ORAL | Status: DC

## 2012-11-28 NOTE — Progress Notes (Signed)
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 Better after rx with advair / prednsone  - coughs so hard chokes, even if not coughing still sob x inclines or steps better also p rx for asthma.  Also sense of pnds 24/7 not better p started rx including steroids. rec Stop the advair in one week and if symptoms worse start it back up You may have bronchiectasis from your previous pneumona  and what you coughed up is cast and we need to rule out ABPA> 10% eos but IgE only 44 mucinex dm is best choice or delsym but not both Continue nexium 40 mg Take 30-60 min before first meal of the day and add pepcid 20 mg one at bedtime GERD rx  10/29/2012 f/u ov/Loomis Anacker re chronic cough maintaining off advair  Chief Complaint  Patient presents with  . Follow-up    Cough is much improved since last visit. Still has some occ prod cough with minimal clear sputum.    still sob trying to mow grass no change on advair vs off Has not tried saba yet.  Still has sense of too much mucus in throat while awake but not while sleeping. Try zyrtec 10 mg at bedtime   to see if helps sensation of too much throat mucus and if not will need to stop it and see Dr Annalee Genta but stay on the reflux medication for now (if you want to try off wait six more weeks to stop acid suppression).  11/28/2012 f/u ov/Roniesha Hollingshead > no better sense of pnds from zyrtec p 5 days so stopped it Chief Complaint   Patient presents with  . Acute Visit    Pt c/o chest tightness, increased SOB, and non prod cough x 5 days.   urge to clear throat never better then worse x 5 days p ginger on sushi and then short of breath and wheezy and no better p albuterol .  Cough worse with talking   No obvious daytime variabilty or assoc  cp or chest tightness, subjective wheeze overt sinus or hb symptoms (sees Magod though previously inconsistent with ppi, now on nexium daily) . No unusual exp hx or h/o childhood pna/ asthma or premature birth to her knowledge.    Also denies any obvious fluctuation of symptoms with weather or environmental changes or other aggravating or alleviating factors except as outlined above.  Current Medications, Allergies, Past Medical History, Past Surgical History, Family History, and Social History were reviewed in [employer] record.  ROS  The following are not active complaints unless bolded sore throat, dysphagia, dental problems, itching, sneezing,  nasal congestion or excess/ purulent secretions, ear ache,   fever, chills, sweats, unintended wt loss, pleuritic or exertional cp, hemoptysis,  orthopnea pnd or leg swelling, presyncope, palpitations, heartburn, abdominal pain, anorexia, nausea, vomiting, diarrhea  or change in bowel or urinary habits, change in stools or urine,  dysuria,hematuria,  rash, arthralgias, visual complaints, headache, numbness weakness or ataxia or problems with walking or coordination,  change in mood/affect or memory.                Objective:   Physical Exam  10/29/2012        159 > 11/28/2012  157  Wt Readings from Last 3 Encounters:  10/15/12 161 lb (73.029 kg)  02/08/12 157 lb (71.215 kg)    HEENT: nl dentition, turbinates,  Nl external ear canals without cough reflex - no evidence of excessive pnds and no cobblestoning at all  NECK :  without JVD/Nodes/TM/ nl carotid upstrokes bilaterally   LUNGS: no acc muscle use, clear  to A and P bilaterally without cough on insp or exp maneuvers   CV:  RRR  no s3 or murmur or increase in P2, no edema   ABD:  soft and nontender with nl excursion in the supine position. No bruits or organomegaly, bowel sounds nl  MS:  warm without deformities, calf tenderness, cyanosis or clubbing       cxr 10/01/12   IMPRESSION:  1. No acute cardiopulmonary disease.  2. Lack of visualization of the previously questioned right  perihilar density, likely vascular summation shadows on that exam.  3. Mild chronic interstitial thickening which is nonspecific.  Correlate with chronic bronchitis symptoms      Assessment & Plan:

## 2012-11-28 NOTE — Assessment & Plan Note (Addendum)
-   Allergy profile 10/15/2012 > Eos 10% but IgE on 44 >  Pos aspergillis but minimal - Spirometry nl 10/29/12 off all inhalers/advair - ENT eval 11/20/12 c/w GERD/ [place]  Even with flare there is no evidence at all of wheezing and no better on albuterol so again strongly suspect  Classic Upper airway cough syndrome, so named because it's frequently impossible to sort out how much is  CR/sinusitis with freq throat clearing (which can be related to primary GERD)   vs  causing  secondary (" extra esophageal")  GERD from wide swings in gastric pressure that occur with throat clearing, often  promoting self use of mint and menthol lozenges that reduce the lower esophageal sphincter tone and exacerbate the problem further in a cyclical fashion.   These are the same pts (now being labeled as having "irritable larynx syndrome" by some cough centers) who not infrequently have a history of having failed to tolerate ace inhibitors,  dry powder inhalers or biphosphonates or report having atypical reflux symptoms that don't respond to standard doses of PPI , and are easily confused as having aecopd or asthma flares by even experienced allergists/ pulmonologists.   For now continue max gerd rx, add h1 per guidelines, then consider adding neurontin next.

## 2012-11-28 NOTE — Telephone Encounter (Signed)
Left detailed msg on both #s provided above stating to keep today's pending OV with MW at 3 pm, to seek emergency care if needed in the meantime, and to call back if anything further is needed.  Will sign off on msg as pt has pending OV for today.

## 2012-11-28 NOTE — Patient Instructions (Addendum)
For sensation of throat drainage use chlortrimeton 4 mg every 6 hours (but may cause drowsiness)  Take delsym two tsp every 12 hours and supplement if needed with  tramadol 50 mg up to 2 every 4 hours to suppress the urge to cough. Swallowing water or using ice chips/non mint and menthol containing candies (such as lifesavers or sugarless jolly ranchers) are also effective.  You should rest your voice and avoid activities that you know make you cough.  Once you have eliminated the cough for 3 straight days try reducing the tramadol first,  then the delsym as tolerated.   Prednisone 10 mg take  4 each am x 2 days,   2 each am x 2 days,  1 each am x2days and stop   Stop mucinex and mucinex dm.   If not 100% better with no urge to clear throat next step is to add neurontin (call for prescription)

## 2012-11-28 NOTE — Telephone Encounter (Signed)
Per pt she did get message from yesterday not any better making an appt to be seen.Kathryn Griffin

## 2012-11-28 NOTE — Telephone Encounter (Signed)
Error.Kathryn Griffin ° °

## 2013-01-16 ENCOUNTER — Other Ambulatory Visit: Payer: Self-pay | Admitting: Gastroenterology

## 2013-02-08 ENCOUNTER — Telehealth: Payer: Self-pay | Admitting: Internal Medicine

## 2013-02-08 ENCOUNTER — Encounter: Payer: BC Managed Care – PPO | Admitting: Women's Health

## 2013-02-08 DIAGNOSIS — R059 Cough, unspecified: Secondary | ICD-10-CM

## 2013-02-08 DIAGNOSIS — R05 Cough: Secondary | ICD-10-CM

## 2013-02-08 NOTE — Telephone Encounter (Signed)
Pt last OV 11/28/12 w/ MW for cough. Per pt when she coughed up this brown phlem and it was a lot of phlem. She stated she could feel this in her chest. She stated it was amount of tube of chap stick. It was brown, texture of thick "oatmeal". The amount she coughed up yesterday was more in amount than prior. She stated this has been going on since November and MW is aware of this. Pt is still taking nexium and pepcid for reflux. Pt started mucinex 600 mg 2 po bid x 4 days now. Pt stated usually it is very small amount of mucus she coughs up. Pt states her breathing is only slight better and does not feel as much pressure in her chest since she coughed up that much phlem yesterday. Pt of course still has SOB w/ activity. Pt did not feel like she needed OV for this since this has been going and saw MW in June. 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 Pt is requesting recs. Please advise MW thanks

## 2013-02-08 NOTE — Telephone Encounter (Signed)
Best next step is methacholine challenge while still on max gerd rx/ diet then ov asap p the test to go over next step and review it with her

## 2013-02-08 NOTE — Telephone Encounter (Signed)
I spoke with pt and is aware. Order placed. Will call Monday morn to get this scheduled and then called pt for appt.

## 2013-02-11 NOTE — Telephone Encounter (Signed)
The amt of mucus does indicate the cause and can be seen in asthma as well as many other of the condtions we're trying to work our way through starting with the most common in a step wise approach so no need to the take the next step until we do this one first

## 2013-02-11 NOTE — Telephone Encounter (Signed)
Pt advised and order placed. Jennifer Castillo, CMA  

## 2013-02-11 NOTE — Telephone Encounter (Signed)
Spoke with-- Patient wouls like to know why Dr Sherene Sires thought a methacholine challeng was next best step Wondering why the focus is on the shortness of breath and not the large amounts of mucus she is producing Dr. Sherene Sires please advise

## 2013-02-12 ENCOUNTER — Encounter: Payer: Self-pay | Admitting: Gynecology

## 2013-02-12 ENCOUNTER — Ambulatory Visit (INDEPENDENT_AMBULATORY_CARE_PROVIDER_SITE_OTHER): Payer: BC Managed Care – PPO | Admitting: Gynecology

## 2013-02-12 VITALS — BP 124/78 | Ht 66.5 in | Wt 154.0 lb

## 2013-02-12 DIAGNOSIS — M899 Disorder of bone, unspecified: Secondary | ICD-10-CM

## 2013-02-12 DIAGNOSIS — Z833 Family history of diabetes mellitus: Secondary | ICD-10-CM

## 2013-02-12 DIAGNOSIS — M8580 Other specified disorders of bone density and structure, unspecified site: Secondary | ICD-10-CM | POA: Insufficient documentation

## 2013-02-12 DIAGNOSIS — N951 Menopausal and female climacteric states: Secondary | ICD-10-CM

## 2013-02-12 DIAGNOSIS — Z780 Asymptomatic menopausal state: Secondary | ICD-10-CM

## 2013-02-12 DIAGNOSIS — Z01419 Encounter for gynecological examination (general) (routine) without abnormal findings: Secondary | ICD-10-CM

## 2013-02-12 LAB — HEMOGLOBIN A1C
Hgb A1c MFr Bld: 5.7 % — ABNORMAL HIGH (ref <5.7)
Mean Plasma Glucose: 117 mg/dL — ABNORMAL HIGH (ref <117)

## 2013-02-12 MED ORDER — NONFORMULARY OR COMPOUNDED ITEM: Status: DC

## 2013-02-12 NOTE — Progress Notes (Signed)
Kathryn Griffin 1956/04/11 [id number]   History:    57 y.o.  for annual gyn exam who is currently being followed by pulmonologist Dr. Sandrea Hughs for chronic cough see separate entry in Epic for additional details. The patient had a colonoscopy in 2014 were by benign polyps were removed. Patient with prior tubal sterilization procedure. She has complained of dyspareunia vaginal dryness and irritation. She has never been on any hormone replacement therapy. Her mammograms are up-to-date last was January this year which was normal. Patient frequently does her breast exam. Patient had a bone density study in February of 2014 with an Allis T score of -1.5 at the right femoral neck. (Normal FRAX) analysis. Patient with very strong family history of diabetes where by she had gestational diabetes and her grandmother brother and father also had diabetes.  Past medical history,surgical history, family history and social history were all reviewed and documented in the EPIC chart.  Gynecologic History No LMP recorded. Patient is postmenopausal. Contraception: tubal ligation Last Pap: 2012. Results were: normal Last mammogram: 2014. Results were: normal  Obstetric History OB History  Gravida Para Term Preterm AB SAB TAB Ectopic Multiple Living  4 2   2 2    2     # Outcome Date GA Lbr Len/2nd Weight Sex Delivery Anes PTL Lv  4 SAB           3 SAB           2 PAR           1 PAR                ROS: A ROS was performed and pertinent positives and negatives are included in the history.  GENERAL: No fevers or chills. HEENT: No change in vision, no earache, sore throat or sinus congestion. NECK: No pain or stiffness. CARDIOVASCULAR: No chest pain or pressure. No palpitations. PULMONARY: No shortness of breath, cough or wheeze. GASTROINTESTINAL: No abdominal pain, nausea, vomiting or diarrhea, melena or bright red blood per rectum. GENITOURINARY: No urinary frequency, urgency, hesitancy or dysuria.  MUSCULOSKELETAL: No joint or muscle pain, no back pain, no recent trauma. DERMATOLOGIC: No rash, no itching, no lesions. ENDOCRINE: No polyuria, polydipsia, no heat or cold intolerance. No recent change in weight. HEMATOLOGICAL: No anemia or easy bruising or bleeding. NEUROLOGIC: No headache, seizures, numbness, tingling or weakness. PSYCHIATRIC: No depression, no loss of interest in normal activity or change in sleep pattern.     Exam: chaperone present  BP 124/78  Ht 5' 6.5" (1.689 m)  Wt 154 lb (69.854 kg)  BMI 24.49 kg/m2  Body mass index is 24.49 kg/(m^2).  General appearance : Well developed well nourished female. No acute distress HEENT: Neck supple, trachea midline, no carotid bruits, no thyroidmegaly Lungs: Clear to auscultation, no rhonchi or wheezes, or rib retractions  Heart: Regular rate and rhythm, no murmurs or gallops Breast:Examined in sitting and supine position were symmetrical in appearance, no palpable masses or tenderness,  no skin retraction, no nipple inversion, no nipple discharge, no skin discoloration, no axillary or supraclavicular lymphadenopathy Abdomen: no palpable masses or tenderness, no rebound or guarding Extremities: no edema or skin discoloration or tenderness  Pelvic:  Bartholin, Urethra, Skene Glands: Within normal limits             Vagina: No gross lesions or discharge, atrophic changes  Cervix: No gross lesions or discharge  Uterus  anteverted, normal size, shape and consistency, non-tender and mobile  Adnexa  Without masses or tenderness  Anus and perineum  normal   Rectovaginal  normal sphincter tone without palpated masses or tenderness             Hemoccult colonoscopy this year     Assessment/Plan:  57 y.o. female for annual exam with severe vaginal atrophy. We discussed the risks benefits and pros and cons of hormone replacement therapy. She'll be started on estradiol vaginal cream 0.02% to apply twice a week. She recently had some  blood work drawn but we will check today her TSH as well as a screen cholesterol hemoglobin A1c.  New CDC guidelines is recommending patients be tested once in her lifetime for hepatitis C antibody who were born between [birth year] through 1963. This was discussed with the patient today and has agreed to be tested today.  No Pap smear done today the new guidelines were adhered. She was reminded her monthly breast exams. We discussed importance of calcium vitamin D for osteoporosis prevention. Patient was given her bone density study next upper weeks.  Ok Edwards MD, 3:03 PM 02/12/2013

## 2013-02-12 NOTE — Patient Instructions (Addendum)
Tetanus, Diphtheria, Pertussis (Tdap) Vaccine What You Need to Know WHY GET VACCINATED? Tetanus, diphtheria and pertussis can be very serious diseases, even for adolescents and adults. Tdap vaccine can protect us from these diseases. TETANUS (Lockjaw) causes painful muscle tightening and stiffness, usually all over the body.  It can lead to tightening of muscles in the head and neck so you can't open your mouth, swallow, or sometimes even breathe. Tetanus kills about 1 out of 5 people who are infected. DIPHTHERIA can cause a thick coating to form in the back of the throat.  It can lead to breathing problems, paralysis, heart failure, and death. PERTUSSIS (Whooping Cough) causes severe coughing spells, which can cause difficulty breathing, vomiting and disturbed sleep.  It can also lead to weight loss, incontinence, and rib fractures. Up to 2 in 100 adolescents and 5 in 100 adults with pertussis are hospitalized or have complications, which could include pneumonia and death. These diseases are caused by bacteria. Diphtheria and pertussis are spread from person to person through coughing or sneezing. Tetanus enters the body through cuts, scratches, or wounds. Before vaccines, the [place] saw as many as 200,000 cases a year of diphtheria and pertussis, and hundreds of cases of tetanus. Since vaccination began, tetanus and diphtheria have dropped by about 99% and pertussis by about 80%. TDAP VACCINE Tdap vaccine can protect adolescents and adults from tetanus, diphtheria, and pertussis. One dose of Tdap is routinely given at age 11 or 12. People who did not get Tdap at that age should get it as soon as possible. Tdap is especially important for health care professionals and anyone having close contact with a baby younger than 12 months. Pregnant women should get a dose of Tdap during every pregnancy, to protect the newborn from pertussis. Infants are most at risk for severe, life-threatening  complications from pertussis. A similar vaccine, called Td, protects from tetanus and diphtheria, but not pertussis. A Td booster should be given every 10 years. Tdap may be given as one of these boosters if you have not already gotten a dose. Tdap may also be given after a severe cut or burn to prevent tetanus infection. Your doctor can give you more information. Tdap may safely be given at the same time as other vaccines. SOME PEOPLE SHOULD NOT GET THIS VACCINE  If you ever had a life-threatening allergic reaction after a dose of any tetanus, diphtheria, or pertussis containing vaccine, OR if you have a severe allergy to any part of this vaccine, you should not get Tdap. Tell your doctor if you have any severe allergies.  If you had a coma, or long or multiple seizures within 7 days after a childhood dose of DTP or DTaP, you should not get Tdap, unless a cause other than the vaccine was found. You can still get Td.  Talk to your doctor if you:  have epilepsy or another nervous system problem,  had severe pain or swelling after any vaccine containing diphtheria, tetanus or pertussis,  ever had Guillain-Barr Syndrome (GBS),  aren't feeling well on the day the shot is scheduled. RISKS OF A VACCINE REACTION With any medicine, including vaccines, there is a chance of side effects. These are usually mild and go away on their own, but serious reactions are also possible. Brief fainting spells can follow a vaccination, leading to injuries from falling. Sitting or lying down for about 15 minutes can help prevent these. Tell your doctor if you feel dizzy or light-headed, or   have vision changes or ringing in the ears. Mild problems following Tdap (Did not interfere with activities)  Pain where the shot was given (about 3 in 4 adolescents or 2 in 3 adults)  Redness or swelling where the shot was given (about 1 person in 5)  Mild fever of at least 100.11F (up to about 1 in 25 adolescents or 1 in  100 adults)  Headache (about 3 or 4 people in 10)  Tiredness (about 1 person in 3 or 4)  Nausea, vomiting, diarrhea, stomach ache (up to 1 in 4 adolescents or 1 in 10 adults)  Chills, body aches, sore joints, rash, swollen glands (uncommon) Moderate problems following Tdap (Interfered with activities, but did not require medical attention)  Pain where the shot was given (about 1 in 5 adolescents or 1 in 100 adults)  Redness or swelling where the shot was given (up to about 1 in 16 adolescents or 1 in 25 adults)  Fever over 102F (about 1 in 100 adolescents or 1 in 250 adults)  Headache (about 3 in 20 adolescents or 1 in 10 adults)  Nausea, vomiting, diarrhea, stomach ache (up to 1 or 3 people in 100)  Swelling of the entire arm where the shot was given (up to about 3 in 100). Severe problems following Tdap (Unable to perform usual activities, required medical attention)  Swelling, severe pain, bleeding and redness in the arm where the shot was given (rare). A severe allergic reaction could occur after any vaccine (estimated less than 1 in a million doses). WHAT IF THERE IS A SERIOUS REACTION? What should I look for?  Look for anything that concerns you, such as signs of a severe allergic reaction, very high fever, or behavior changes. Signs of a severe allergic reaction can include hives, swelling of the face and throat, difficulty breathing, a fast heartbeat, dizziness, and weakness. These would start a few minutes to a few hours after the vaccination. What should I do?  If you think it is a severe allergic reaction or other emergency that can't wait, call 9-1-1 or get the person to the nearest hospital. Otherwise, call your doctor.  Afterward, the reaction should be reported to the "Vaccine Adverse Event Reporting System" (VAERS). Your doctor might file this report, or you can do it yourself through the VAERS web site at www.vaers.LAgents.no, or by calling 1-(920)353-7345. VAERS is  only for reporting reactions. They do not give medical advice.  THE NATIONAL VACCINE INJURY COMPENSATION PROGRAM The National Vaccine Injury Compensation Program (VICP) is a federal program that was created to compensate people who may have been injured by certain vaccines. Persons who believe they may have been injured by a vaccine can learn about the program and about filing a claim by calling 1-626-394-9025 or visiting the VICP website at SpiritualWord.at. HOW CAN I LEARN MORE?  Ask your doctor.  Call your local or state health department.  Contact the Centers for Disease Control and Prevention (CDC):  Call 281-246-5877 or visit CDC's website at PicCapture.uy. CDC Tdap Vaccine VIS (11/03/11) Document Released: 12/13/2011 Document Revised: 03/07/2012 Document Reviewed: 12/13/2011 [employer] Patient Information 2014 [place], [place]. Hormone Therapy At menopause, your body begins making less estrogen and progesterone hormones. This causes the body to stop having menstrual periods. This is because estrogen and progesterone hormones control your periods and menstrual cycle. A lack of estrogen may cause symptoms such as:  Hot flushes (or hot flashes).  Vaginal dryness.  Dry skin.  Loss of sex drive.  Risk of bone loss (osteoporosis). When this happens, you may choose to take hormone therapy to get back the estrogen lost during menopause. When the hormone estrogen is given alone, it is usually referred to as ET (Estrogen Therapy). When the hormone progestin is combined with estrogen, it is generally called HT (Hormone Therapy). This was formerly known as hormone replacement therapy (HRT). Your caregiver can help you make a decision on what will be best for you. The decision to use HT seems to change often as new studies are done. Many studies do not agree on the benefits of hormone replacement therapy. LIKELY BENEFITS OF HT INCLUDE PROTECTION FROM:  Hot Flushes (also  called hot flashes) - A hot flush is a sudden feeling of heat that spreads over the face and body. The skin may redden like a blush. It is connected with sweats and sleep disturbance. Women going through menopause may have hot flushes a few times a month or several times per day depending on the woman.  Osteoporosis (bone loss)- Estrogen helps guard against bone loss. After menopause, a woman's bones slowly lose calcium and become weak and brittle. As a result, bones are more likely to break. The hip, wrist, and spine are affected most often. Hormone therapy can help slow bone loss after menopause. Weight bearing exercise and taking calcium with vitamin D also can help prevent bone loss. There are also medications that your caregiver can prescribe that can help prevent osteoporosis.  Vaginal Dryness - Loss of estrogen causes changes in the vagina. Its lining may become thin and dry. These changes can cause pain and bleeding during sexual intercourse. Dryness can also lead to infections. This can cause burning and itching. (Vaginal estrogen treatment can help relieve pain, itching, and dryness.)  Urinary Tract Infections are more common after menopause because of lack of estrogen. Some women also develop urinary incontinence because of low estrogen levels in the vagina and bladder.  Possible other benefits of estrogen include a positive effect on mood and short-term memory in women. RISKS AND COMPLICATIONS  Using estrogen alone without progesterone causes the lining of the uterus to grow. This increases the risk of lining of the uterus (endometrial) cancer. Your caregiver should give another hormone called progestin if you have a uterus.  Women who take combined (estrogen and progestin) HT appear to have an increased risk of breast cancer. The risk appears to be small, but increases throughout the time that HT is taken.  Combined therapy also makes the breast tissue slightly denser which makes it  harder to read mammograms (breast X-rays).  Combined, estrogen and progesterone therapy can be taken together every day, in which case there may be spotting of blood. HT therapy can be taken cyclically in which case you will have menstrual periods. Cyclically means HT is taken for a set amount of days, then not taken, then this process is repeated.  HT may increase the risk of stroke, heart attack, breast cancer and forming blood clots in your leg.  Transdermal estrogen (estrogen that is absorbed through the skin with a patch or a cream) may have more positive results with:  Cholesterol.  Blood pressure.  Blood clots. Having the following conditions may indicate you should not have HT:  Endometrial cancer.  Liver disease.  Breast cancer.  Heart disease.  History of blood clots.  Stroke. TREATMENT   If you choose to take HT and have a uterus, usually estrogen and progestin are prescribed.  Your caregiver will  help you decide the best way to take the medications.  Possible ways to take estrogen include:  Pills.  Patches.  Gels.  Sprays.  Vaginal estrogen cream, rings and tablets.  It is best to take the lowest dose possible that will help your symptoms and take them for the shortest period of time that you can.  Hormone therapy can help relieve some of the problems (symptoms) that affect women at menopause. Before making a decision about HT, talk to your caregiver about what is best for you. Be well informed and comfortable with your decisions. HOME CARE INSTRUCTIONS   Follow your caregivers advice when taking the medications.  A Pap test is done to screen for cervical cancer.  The first Pap test should be done at age 8.  Between ages 21 and 50, Pap tests are repeated every 2 years.  Beginning at age 71, you are advised to have a Pap test every 3 years as long as your past 3 Pap tests have been normal.  Some women have medical problems that increase the  chance of getting cervical cancer. Talk to your caregiver about these problems. It is especially important to talk to your caregiver if a new problem develops soon after your last Pap test. In these cases, your caregiver may recommend more frequent screening and Pap tests.  The above recommendations are the same for women who have or have not gotten the vaccine for HPV (Human Papillomavirus).  If you had a hysterectomy for a problem that was not a cancer or a condition that could lead to cancer, then you no longer need Pap tests. However, even if you no longer need a Pap test, a regular exam is a good idea to make sure no other problems are starting.   If you are between ages 38 and 53, and you have had normal Pap tests going back 10 years, you no longer need Pap tests. However, even if you no longer need a Pap test, a regular exam is a good idea to make sure no other problems are starting.   If you have had past treatment for cervical cancer or a condition that could lead to cancer, you need Pap tests and screening for cancer for at least 20 years after your treatment.  If Pap tests have been discontinued, risk factors (such as a new sexual partner) need to be re-assessed to determine if screening should be resumed.  Some women may need screenings more often if they are at high risk for cervical cancer.  Get mammograms done as per the advice of your caregiver. SEEK IMMEDIATE MEDICAL CARE IF:  You develop abnormal vaginal bleeding.  You have pain or swelling in your legs, shortness of breath, or chest pain.  You develop dizziness or headaches.  You have lumps or changes in your breasts or armpits.  You have slurred speech.  You develop weakness or numbness of your arms or legs.  You have pain, burning, or bleeding when urinating.  You develop abdominal pain. Document Released: 03/12/2003 Document Revised: 09/05/2011 Document Reviewed: 06/30/2010 [facility] Patient Information  2014 [place], [place].

## 2013-02-13 LAB — URINALYSIS W MICROSCOPIC + REFLEX CULTURE
Bilirubin Urine: NEGATIVE
Casts: NONE SEEN
Crystals: NONE SEEN
Glucose, UA: NEGATIVE mg/dL
Hgb urine dipstick: NEGATIVE
Leukocytes, UA: NEGATIVE
Nitrite: NEGATIVE
Protein, ur: NEGATIVE mg/dL
Specific Gravity, Urine: 1.021 (ref 1.005–1.030)
Squamous Epithelial / LPF: NONE SEEN
Urobilinogen, UA: 0.2 mg/dL (ref 0.0–1.0)
pH: 6 (ref 5.0–8.0)

## 2013-02-13 LAB — TSH: TSH: 1.982 u[IU]/mL (ref 0.350–4.500)

## 2013-02-13 LAB — CHOLESTEROL, TOTAL: Cholesterol: 170 mg/dL (ref 0–200)

## 2013-02-14 ENCOUNTER — Ambulatory Visit (HOSPITAL_COMMUNITY)
Admission: RE | Admit: 2013-02-14 | Discharge: 2013-02-14 | Disposition: A | Discharge status: Home / Self Care | Payer: BC Managed Care – PPO | Source: Ambulatory Visit | Attending: Internal Medicine | Admitting: Internal Medicine

## 2013-02-14 DIAGNOSIS — R059 Cough, unspecified: Secondary | ICD-10-CM | POA: Insufficient documentation

## 2013-02-14 DIAGNOSIS — R05 Cough: Secondary | ICD-10-CM | POA: Insufficient documentation

## 2013-02-14 LAB — PULMONARY FUNCTION TEST

## 2013-02-14 MED ORDER — METHACHOLINE 16 MG/ML NEB SOLN
2.0000 mL | Freq: Once | RESPIRATORY_TRACT | Status: AC
  Administered 2013-02-14: 32 mg via RESPIRATORY_TRACT

## 2013-02-14 MED ORDER — METHACHOLINE 0.25 MG/ML NEB SOLN
2.0000 mL | Freq: Once | RESPIRATORY_TRACT | Status: AC
  Administered 2013-02-14: 0.5 mg via RESPIRATORY_TRACT

## 2013-02-14 MED ORDER — METHACHOLINE 1 MG/ML NEB SOLN
2.0000 mL | Freq: Once | RESPIRATORY_TRACT | Status: AC
  Administered 2013-02-14: 2 mg via RESPIRATORY_TRACT

## 2013-02-14 MED ORDER — METHACHOLINE 0.0625 MG/ML NEB SOLN
2.0000 mL | Freq: Once | RESPIRATORY_TRACT | Status: AC
  Administered 2013-02-14: 0.125 mg via RESPIRATORY_TRACT

## 2013-02-14 MED ORDER — SODIUM CHLORIDE 0.9 % IN NEBU
3.0000 mL | INHALATION_SOLUTION | Freq: Once | RESPIRATORY_TRACT | Status: AC
  Administered 2013-02-14: 3 mL via RESPIRATORY_TRACT

## 2013-02-14 MED ORDER — METHACHOLINE 4 MG/ML NEB SOLN
2.0000 mL | Freq: Once | RESPIRATORY_TRACT | Status: AC
  Administered 2013-02-14: 8 mg via RESPIRATORY_TRACT

## 2013-02-14 MED ORDER — ALBUTEROL SULFATE (5 MG/ML) 0.5% IN NEBU
2.5000 mg | INHALATION_SOLUTION | Freq: Once | RESPIRATORY_TRACT | Status: AC
  Administered 2013-02-14: 2.5 mg via RESPIRATORY_TRACT

## 2013-02-20 ENCOUNTER — Encounter (HOSPITAL_COMMUNITY): Payer: BC Managed Care – PPO

## 2013-02-21 ENCOUNTER — Telehealth: Payer: Self-pay

## 2013-02-21 ENCOUNTER — Telehealth: Payer: Self-pay | Admitting: Internal Medicine

## 2013-02-21 ENCOUNTER — Encounter: Payer: Self-pay | Admitting: Gynecology

## 2013-02-21 NOTE — Telephone Encounter (Signed)
[facility] test done 02/14/13. Please advise MW if this has been received thanks

## 2013-02-21 NOTE — Telephone Encounter (Signed)
i am waiting on the final report

## 2013-02-21 NOTE — Telephone Encounter (Signed)
Calling for lab results from last Tuesday.   Hgb A1c and glucose very borderline.  What did you want to tell patient regarding labs?

## 2013-02-21 NOTE — Telephone Encounter (Signed)
lmomtcb x1 for pt 

## 2013-02-21 NOTE — Telephone Encounter (Signed)
Random blood sugar less than 120 is normal. Hemoglobin A1c 5.7 her last is normal and she was 5.7. Her screen cholesterol was normal. Good healthy 82 and regular exercise and we will recheck next year.

## 2013-02-21 NOTE — Telephone Encounter (Signed)
Patient informed. 

## 2013-02-21 NOTE — Telephone Encounter (Signed)
Left detailed message on pt VM. Advised her once MW receives final report we will call her

## 2013-02-21 NOTE — Telephone Encounter (Signed)
Pt returned triage's call & can be reached [phone].  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  Kathryn Griffin

## 2013-02-22 ENCOUNTER — Encounter: Payer: Self-pay | Admitting: Internal Medicine

## 2013-02-27 ENCOUNTER — Telehealth: Payer: Self-pay | Admitting: *Deleted

## 2013-02-27 NOTE — Telephone Encounter (Signed)
Pt called and said you gave her a name of a pulmonologist Dr.Hung at [facility]? Pt said that she has appointment on 03/11/13 but they need a referral. Okay make referral? Please advise

## 2013-03-01 ENCOUNTER — Telehealth: Payer: Self-pay

## 2013-03-01 NOTE — Telephone Encounter (Signed)
Patient called about referral to Dr. Elnoria Howard in [place].  She said she had called earlier in the week and has not heard back from [place]. I explained Dr. Glenetta Hew off this week but that Victorino Dike will be in touch with her first of next week and the referral will not be a problem.

## 2013-03-01 NOTE — Telephone Encounter (Signed)
Please set up referral for Chronic Bronchitis. Send them copy of Dr. Casimiro Needle Wert's note from local pulmonologist or they can review on line in Epic from [facility].

## 2013-03-04 NOTE — Telephone Encounter (Signed)
Notes faxed to Dr.Hung office at [phone], pt informed.

## 2013-03-07 ENCOUNTER — Telehealth: Payer: Self-pay | Admitting: Internal Medicine

## 2013-03-07 NOTE — Telephone Encounter (Signed)
Please advise MW if you have pt's [facility] results thanks

## 2013-03-08 NOTE — Telephone Encounter (Signed)
I have not seen them > see if we can get them sent over and I'll call her as soon as I have a change to review them - most likely Monday th 15th

## 2013-03-08 NOTE — Telephone Encounter (Signed)
I called and spoke with michelle. She will fax this to triage. Will await fax

## 2013-03-11 ENCOUNTER — Telehealth: Payer: Self-pay | Admitting: Internal Medicine

## 2013-03-11 NOTE — Telephone Encounter (Signed)
Error.Kathryn Griffin ° °

## 2013-03-11 NOTE — Telephone Encounter (Signed)
Pt calling again in ref to previous msg can be reached at [phone].Kathryn Griffin

## 2013-03-11 NOTE — Telephone Encounter (Signed)
Kathryn Au do you know if this was received? Please advise thanks

## 2013-03-13 NOTE — Telephone Encounter (Signed)
I never received results Spoke with Marcelino Duster and she will fax again

## 2013-03-13 NOTE — Telephone Encounter (Signed)
Pt returned call.  Spoke with patient and advised of [facility] results/recs as stated by MW below.  Pt verbalized her understanding and stated that she is still having symptoms but stated that she is unable to schedule an appt at this time as she is driving.  Reminded pt that she does have an active mychart account and can use this to schedule an appt if that would be more convenient for her.  Pt verbalized her understanding and denied any questions at this time.

## 2013-03-13 NOTE — Telephone Encounter (Signed)
Neg for asthma - needs ov with all meds in hand to regroup if still having symptoms

## 2013-03-13 NOTE — Telephone Encounter (Signed)
lmomtcb x1 

## 2013-03-13 NOTE — Telephone Encounter (Signed)
Results in MW's lookat  Please advise, thanks!

## 2013-03-25 ENCOUNTER — Other Ambulatory Visit: Payer: Self-pay | Admitting: Sports Medicine

## 2013-03-25 DIAGNOSIS — M545 Low back pain, unspecified: Secondary | ICD-10-CM

## 2013-03-28 ENCOUNTER — Ambulatory Visit (INDEPENDENT_AMBULATORY_CARE_PROVIDER_SITE_OTHER): Payer: BC Managed Care – PPO

## 2013-03-28 DIAGNOSIS — M8580 Other specified disorders of bone density and structure, unspecified site: Secondary | ICD-10-CM

## 2013-03-28 DIAGNOSIS — M899 Disorder of bone, unspecified: Secondary | ICD-10-CM

## 2013-03-30 ENCOUNTER — Other Ambulatory Visit: Payer: BC Managed Care – PPO

## 2013-04-03 ENCOUNTER — Ambulatory Visit
Admission: RE | Admit: 2013-04-03 | Discharge: 2013-04-03 | Disposition: A | Discharge status: Home / Self Care | Payer: BC Managed Care – PPO | Source: Ambulatory Visit | Attending: Sports Medicine | Admitting: Sports Medicine

## 2013-04-03 DIAGNOSIS — M545 Low back pain, unspecified: Secondary | ICD-10-CM

## 2013-04-27 DIAGNOSIS — B4489 Other forms of aspergillosis: Secondary | ICD-10-CM

## 2013-04-27 HISTORY — DX: Other forms of aspergillosis: B44.89

## 2013-05-02 ENCOUNTER — Other Ambulatory Visit: Payer: Self-pay

## 2013-05-22 ENCOUNTER — Encounter (HOSPITAL_COMMUNITY): Payer: Self-pay | Admitting: Emergency Medicine

## 2013-05-22 ENCOUNTER — Emergency Department (HOSPITAL_COMMUNITY): Payer: BC Managed Care – PPO

## 2013-05-22 ENCOUNTER — Inpatient Hospital Stay (HOSPITAL_COMMUNITY): Payer: BC Managed Care – PPO

## 2013-05-22 ENCOUNTER — Inpatient Hospital Stay (HOSPITAL_COMMUNITY)
Admission: EM | Admit: 2013-05-22 | Discharge: 2013-05-25 | DRG: 206 | Disposition: A | Discharge status: Home / Self Care | Payer: BC Managed Care – PPO | Attending: Pulmonary Disease | Admitting: Pulmonary Disease

## 2013-05-22 DIAGNOSIS — Z833 Family history of diabetes mellitus: Secondary | ICD-10-CM

## 2013-05-22 DIAGNOSIS — T380X5A Adverse effect of glucocorticoids and synthetic analogues, initial encounter: Secondary | ICD-10-CM | POA: Diagnosis present

## 2013-05-22 DIAGNOSIS — M8580 Other specified disorders of bone density and structure, unspecified site: Secondary | ICD-10-CM

## 2013-05-22 DIAGNOSIS — F411 Generalized anxiety disorder: Secondary | ICD-10-CM | POA: Diagnosis present

## 2013-05-22 DIAGNOSIS — T17508A Unspecified foreign body in bronchus causing other injury, initial encounter: Principal | ICD-10-CM | POA: Diagnosis present

## 2013-05-22 DIAGNOSIS — R05 Cough: Secondary | ICD-10-CM

## 2013-05-22 DIAGNOSIS — Z8632 Personal history of gestational diabetes: Secondary | ICD-10-CM

## 2013-05-22 DIAGNOSIS — J9600 Acute respiratory failure, unspecified whether with hypoxia or hypercapnia: Secondary | ICD-10-CM

## 2013-05-22 DIAGNOSIS — I498 Other specified cardiac arrhythmias: Secondary | ICD-10-CM | POA: Diagnosis present

## 2013-05-22 DIAGNOSIS — R0602 Shortness of breath: Secondary | ICD-10-CM

## 2013-05-22 DIAGNOSIS — K219 Gastro-esophageal reflux disease without esophagitis: Secondary | ICD-10-CM | POA: Diagnosis present

## 2013-05-22 DIAGNOSIS — IMO0002 Reserved for concepts with insufficient information to code with codable children: Secondary | ICD-10-CM | POA: Diagnosis present

## 2013-05-22 DIAGNOSIS — D72829 Elevated white blood cell count, unspecified: Secondary | ICD-10-CM | POA: Diagnosis present

## 2013-05-22 DIAGNOSIS — Z780 Asymptomatic menopausal state: Secondary | ICD-10-CM

## 2013-05-22 DIAGNOSIS — R061 Stridor: Secondary | ICD-10-CM | POA: Diagnosis present

## 2013-05-22 DIAGNOSIS — R059 Cough, unspecified: Secondary | ICD-10-CM

## 2013-05-22 DIAGNOSIS — J479 Bronchiectasis, uncomplicated: Secondary | ICD-10-CM | POA: Diagnosis present

## 2013-05-22 LAB — COMPREHENSIVE METABOLIC PANEL
ALT: 24 U/L (ref 0–35)
AST: 20 U/L (ref 0–37)
Albumin: 3.8 g/dL (ref 3.5–5.2)
Alkaline Phosphatase: 70 U/L (ref 39–117)
BUN: 12 mg/dL (ref 6–23)
CO2: 27 mEq/L (ref 19–32)
Calcium: 9 mg/dL (ref 8.4–10.5)
Chloride: 104 mEq/L (ref 96–112)
Creatinine, Ser: 0.61 mg/dL (ref 0.50–1.10)
GFR calc Af Amer: >90 mL/min (ref >90)
GFR calc non Af Amer: >90 mL/min (ref >90)
Glucose, Bld: 249 mg/dL — ABNORMAL HIGH (ref 70–99)
Potassium: 2.8 mEq/L — ABNORMAL LOW (ref 3.5–5.1)
Sodium: 143 mEq/L (ref 135–145)
Total Bilirubin: 0.3 mg/dL (ref 0.3–1.2)
Total Protein: 6.9 g/dL (ref 6.0–8.3)

## 2013-05-22 LAB — PHOSPHORUS: Phosphorus: 1.9 mg/dL — ABNORMAL LOW (ref 2.3–4.6)

## 2013-05-22 LAB — POCT I-STAT, CHEM 8
BUN: 12 mg/dL (ref 6–23)
Calcium, Ion: 1.26 mmol/L — ABNORMAL HIGH (ref 1.12–1.23)
Chloride: 103 mEq/L (ref 96–112)
Creatinine, Ser: 1 mg/dL (ref 0.50–1.10)
Glucose, Bld: 116 mg/dL — ABNORMAL HIGH (ref 70–99)
HCT: 45 % (ref 36.0–46.0)
Hemoglobin: 15.3 g/dL — ABNORMAL HIGH (ref 12.0–15.0)
Potassium: 3.6 mEq/L (ref 3.5–5.1)
Sodium: 142 mEq/L (ref 135–145)
TCO2: 27 mmol/L (ref 0–100)

## 2013-05-22 LAB — MRSA PCR SCREENING: MRSA by PCR: NEGATIVE

## 2013-05-22 LAB — CBC WITH DIFFERENTIAL/PLATELET
Basophils Absolute: 0.2 10*3/uL — ABNORMAL HIGH (ref 0.0–0.1)
Basophils Relative: 3 % — ABNORMAL HIGH (ref 0–1)
Eosinophils Absolute: 1.6 10*3/uL — ABNORMAL HIGH (ref 0.0–0.7)
Eosinophils Relative: 30 % — ABNORMAL HIGH (ref 0–5)
HCT: 43.6 % (ref 36.0–46.0)
Hemoglobin: 14.9 g/dL (ref 12.0–15.0)
Lymphocytes Relative: 32 % (ref 12–46)
Lymphs Abs: 1.7 10*3/uL (ref 0.7–4.0)
MCH: 30.8 pg (ref 26.0–34.0)
MCHC: 34.2 g/dL (ref 30.0–36.0)
MCV: 90.3 fL (ref 78.0–100.0)
Monocytes Absolute: 0.5 10*3/uL (ref 0.1–1.0)
Monocytes Relative: 9 % (ref 3–12)
Neutro Abs: 1.4 10*3/uL — ABNORMAL LOW (ref 1.7–7.7)
Neutrophils Relative %: 26 % — ABNORMAL LOW (ref 43–77)
Platelets: 307 10*3/uL (ref 150–400)
RBC: 4.83 MIL/uL (ref 3.87–5.11)
RDW: 13.4 % (ref 11.5–15.5)
Smear Review: ADEQUATE
WBC: 5.4 10*3/uL (ref 4.0–10.5)

## 2013-05-22 LAB — GLUCOSE, CAPILLARY
Glucose-Capillary: 224 mg/dL — ABNORMAL HIGH (ref 70–99)
Glucose-Capillary: 150 mg/dL — ABNORMAL HIGH (ref 70–99)
Glucose-Capillary: 149 mg/dL — ABNORMAL HIGH (ref 70–99)

## 2013-05-22 LAB — PROTIME-INR
INR: 0.87 (ref 0.00–1.49)
Prothrombin Time: 11.7 seconds (ref 11.6–15.2)

## 2013-05-22 LAB — MAGNESIUM: Magnesium: 2 mg/dL (ref 1.5–2.5)

## 2013-05-22 LAB — PROCALCITONIN: Procalcitonin: <0.1 ng/mL

## 2013-05-22 LAB — APTT: aPTT: 23 seconds — ABNORMAL LOW (ref 24–37)

## 2013-05-22 MED ORDER — POTASSIUM PHOSPHATE DIBASIC 3 MMOLE/ML IV SOLN
20.0000 meq | Freq: Once | INTRAVENOUS | Status: AC
  Administered 2013-05-22: 20 meq via INTRAVENOUS
  Filled 2013-05-22: qty 4.55

## 2013-05-22 MED ORDER — PANTOPRAZOLE SODIUM 40 MG IV SOLR
40.0000 mg | Freq: Two times a day (BID) | INTRAVENOUS | Status: DC
  Filled 2013-05-22 (×3): qty 40

## 2013-05-22 MED ORDER — LIDOCAINE HCL 2 % IJ SOLN
7.5000 mL | Freq: Once | INTRAMUSCULAR | Status: AC
  Administered 2013-05-22: 150 mg

## 2013-05-22 MED ORDER — SODIUM CHLORIDE 0.9 % IV SOLN
INTRAVENOUS | Status: DC
  Administered 2013-05-22 – 2013-05-24 (×3): via INTRAVENOUS

## 2013-05-22 MED ORDER — DM-GUAIFENESIN ER 30-600 MG PO TB12
1.0000 | ORAL_TABLET | Freq: Two times a day (BID) | ORAL | Status: DC
  Administered 2013-05-22 – 2013-05-25 (×6): 1 via ORAL
  Filled 2013-05-22 (×8): qty 1

## 2013-05-22 MED ORDER — METHYLPREDNISOLONE SODIUM SUCC 40 MG IJ SOLR
40.0000 mg | Freq: Three times a day (TID) | INTRAMUSCULAR | Status: DC
  Administered 2013-05-22 – 2013-05-24 (×7): 40 mg via INTRAVENOUS
  Filled 2013-05-22 (×9): qty 1

## 2013-05-22 MED ORDER — LIDOCAINE HCL (PF) 1 % IJ SOLN
INTRAMUSCULAR | Status: AC
  Filled 2013-05-22: qty 20

## 2013-05-22 MED ORDER — LIDOCAINE HCL (PF) 1 % IJ SOLN: 150.0000 mg | Freq: Once | INTRAMUSCULAR | Status: DC

## 2013-05-22 MED ORDER — RACEPINEPHRINE HCL 2.25 % IN NEBU
0.5000 mL | INHALATION_SOLUTION | Freq: Once | RESPIRATORY_TRACT | Status: AC
  Administered 2013-05-22: 0.5 mL via RESPIRATORY_TRACT
  Filled 2013-05-22: qty 0.5

## 2013-05-22 MED ORDER — ALBUTEROL (5 MG/ML) CONTINUOUS INHALATION SOLN
10.0000 mg/h | INHALATION_SOLUTION | Freq: Once | RESPIRATORY_TRACT | Status: AC
  Administered 2013-05-22: 10 mg/h via RESPIRATORY_TRACT

## 2013-05-22 MED ORDER — ALBUTEROL (5 MG/ML) CONTINUOUS INHALATION SOLN
INHALATION_SOLUTION | RESPIRATORY_TRACT | Status: AC
  Filled 2013-05-22: qty 20

## 2013-05-22 MED ORDER — FAMOTIDINE IN NACL 20-0.9 MG/50ML-% IV SOLN
20.0000 mg | INTRAVENOUS | Status: DC
  Administered 2013-05-22 – 2013-05-24 (×3): 20 mg via INTRAVENOUS
  Filled 2013-05-22 (×3): qty 50

## 2013-05-22 MED ORDER — METHYLPREDNISOLONE SODIUM SUCC 125 MG IJ SOLR
125.0000 mg | Freq: Once | INTRAMUSCULAR | Status: AC
  Administered 2013-05-22: 125 mg via INTRAVENOUS
  Filled 2013-05-22: qty 2

## 2013-05-22 NOTE — ED Provider Notes (Signed)
CSN: [id number]     Arrival date & time 05/22/13  4098 History   First MD Initiated Contact with Patient 05/22/13 [phone]     Chief Complaint  Patient presents with  . Shortness of Breath   (Consider location/radiation/quality/duration/timing/severity/associated sxs/prior Treatment) HPI Comments: Patient is 57 year old female with history of GERD which her husband reports irritates her bronchi and causes upper airway spasms presents to the ED with acute onset of shortness of breath and the sensation that she has mucous caught in her throat.  She denies any recent illness including cough, congestion, nasal congestion.  States that upon waking this morning she developed the sensation of not being able to breath.  She speaks in a whisper and is tachypneic.    The history is provided by the patient and the spouse. The history is limited by the condition of the patient. No language interpreter was used.    Past Medical History  Diagnosis Date  . Gestational diabetes   . Reflux    Past Surgical History  Procedure Laterality Date  . Tubal ligation  1995  . Breast cyst excision  1990  . Colposcopy  2009    benign   Family History  Problem Relation Age of Onset  . Cancer Mother     bladder  . Diabetes Brother   . Hypertension Maternal Grandmother   . Ovarian cancer Maternal Grandmother   . Heart disease Maternal Grandmother   . Diabetes Paternal Grandmother   . COPD Father     heavy smoker  . Prostate cancer Father    History  Substance Use Topics  . Smoking status: Never Smoker   . Smokeless tobacco: Never Used  . Alcohol Use: Yes     Comment: social   OB History   Grav Para Term Preterm Abortions TAB SAB Ect Mult Living   4 2   2  2   2      Review of Systems  Unable to perform ROS: Acuity of condition    Allergies  Review of patient's allergies indicates no known allergies.  Home Medications   Current Outpatient Rx  Name  Route  Sig  Dispense  Refill  . albuterol  (PROAIR HFA) 108 (90 BASE) MCG/ACT inhaler   Inhalation   Inhale 2 puffs into the lungs every 6 (six) hours as needed.         . Calcium Citrate (CITRACAL PO)   Oral   Take 1 tablet by mouth 2 (two) times daily.         Marland Kitchen dextromethorphan-guaiFENesin (MUCINEX DM) 30-600 MG per 12 hr tablet   Oral   Take 1 tablet by mouth daily.         Marland Kitchen esomeprazole (NEXIUM) 40 MG capsule   Oral   Take 40 mg by mouth daily.         . famotidine (PEPCID) 20 MG tablet   Oral   Take 20 mg by mouth at bedtime.         Marland Kitchen MAGNESIUM ASPARTATE PO   Oral   Take 250 mg by mouth daily.         . Multiple Vitamin (MULTIVITAMIN) capsule   Oral   Take 1 capsule by mouth daily.           . NONFORMULARY OR COMPOUNDED ITEM      Estradiol .02% 1 ML Prefilled Applicator Sig: apply vaginally twice a week #90 Day Supply with 4 refills   1 each  4   . predniSONE (STERAPRED UNI-PAK) 10 MG tablet      Prednisone 10 mg take  4 each am x 2 days,   2 each am x 2 days,  1 each am x2days and stop   14 tablet   0   . traMADol (ULTRAM) 50 MG tablet      1-2 every 4 hours as needed for cough or pain   40 tablet   0    BP 114/53  Pulse 101  Resp 19  SpO2 98% Physical Exam  Nursing note and vitals reviewed. Constitutional: She is oriented to person, place, and time. She appears well-developed and well-nourished. She appears distressed.  HENT:  Head: Normocephalic and atraumatic.  Right Ear: External ear normal.  Left Ear: External ear normal.  Nose: Nose normal.  Mouth/Throat: Oropharynx is clear and moist. No oropharyngeal exudate.  Eyes: Conjunctivae are normal. Pupils are equal, round, and reactive to light. No scleral icterus.  Neck: Normal range of motion. Neck supple. No tracheal deviation present.  Cardiovascular: Regular rhythm and normal heart sounds.  Exam reveals no gallop and no friction rub.   No murmur heard. tachycardia  Pulmonary/Chest: Stridor present. She is in  respiratory distress. She has wheezes. She has rales. She exhibits no tenderness.  tachypneic  Abdominal: Soft. Bowel sounds are normal. She exhibits no distension. There is no tenderness.  Musculoskeletal: Normal range of motion. She exhibits no edema and no tenderness.  Neurological: She is alert and oriented to person, place, and time. She exhibits normal muscle tone. Coordination normal.  Skin: Skin is warm and dry. No rash noted. No erythema. No pallor.  Psychiatric: Her behavior is normal. Judgment and thought content normal.  anxious    ED Course  Procedures (including critical care time) Labs Review Labs Reviewed  POCT I-STAT, CHEM 8 - Abnormal; Notable for the following:    Glucose, Bld 116 (*)    Calcium, Ion 1.26 (*)    Hemoglobin 15.3 (*)    All other components within normal limits  CBC WITH DIFFERENTIAL   Imaging Review Dg Chest Portable 1 View  05/22/2013   CLINICAL DATA:  Short of breath.  EXAM: PORTABLE CHEST - 1 VIEW  COMPARISON:  10/01/2012  FINDINGS: Telemetry wires partially obscure the right lung base. The cardiomediastinal silhouette is within normal limits. The lungs are well inflated without evidence of focal airspace consolidation, edema, pleural effusion, or pneumothorax. No acute osseous abnormality is identified.  IMPRESSION: No evidence of acute airspace disease.   Electronically Signed   By: Sebastian Ache   On: 05/22/2013 07:21    EKG Interpretation   None     8:07 AM Patient continues to deny any change in symptoms despite albuterol neb and racemic epinephrine.  Dr. Patria Mane in to see the patient, will give lidocaine nebulized and he will be attempting to visualize her upper airway, which on chest x-ray looks quite edematous.  8:43 AM PCCM here to see patient, fiberoptic scope reveals no edema above the cords but some dysfunctional movement.  8:53 AM Spoke with Dr. Jearld Fenton nurse, she will pass along the consult request from PCCM.  Patient is able to  speak in full sentences now, voice now at normal tone and above a whisper.  CRITICAL CARE Performed by: Patrecia Pour. Total critical care time: 60 Critical care time was exclusive of separately billable procedures and treating other patients. Critical care was necessary to treat or prevent imminent or life-threatening deterioration. Critical  care was time spent personally by me on the following activities: development of treatment plan with patient and/or surrogate as well as nursing, discussions with consultants, evaluation of patient's response to treatment, examination of patient, obtaining history from patient or surrogate, ordering and performing treatments and interventions, ordering and review of laboratory studies, ordering and review of radiographic studies, pulse oximetry and re-evaluation of patient's condition.   MDM  Shortness of breath Upper airway edema and stridor   Patient with stridor and likely upper airway edema presents with acute onset of sensation of throat closing and respiratory distress.  She was initially tachycardic, tachypneic and hypoxic with oxygen sats at 89% on room air.  After albuaterol 10mg  nebulizer and racemic epinephrine and IV solumedrol the patient continues to have the sensation of airway closure.  Fiberoptic scope shows no edema above the level of the cords, PCCM will admit and ENT to consult.   Izola Price Marisue [place], [place] 05/22/13 [phone]

## 2013-05-22 NOTE — ED Notes (Signed)
Pt has hx acid reflux which irritates bronchi and pt then builds up mucus, unable to breath well.  Woke this morning with shortness of breath.

## 2013-05-22 NOTE — ED Notes (Signed)
MD at bedside. 

## 2013-05-22 NOTE — ED Notes (Signed)
Respiratory at bedside.

## 2013-05-22 NOTE — ED Provider Notes (Signed)
Fiberoptic laryngoscopy Date/Time: 05/22/2013 9:08 AM Performed by: Lyanne Co Authorized by: Lyanne Co Consent: Verbal consent obtained. written consent obtained. Risks and benefits: risks, benefits and alternatives were discussed Consent given by: patient Required items: required blood products, implants, devices, and special equipment available Patient identity confirmed: verbally with patient Local anesthesia used: Lidocaine nebulized, 150mg . Patient sedated: no Patient tolerance: Patient tolerated the procedure well with no immediate complications. Comments: Left neck was entered without any difficulty.  Easy visualization of the oropharynx which demonstrates no evidence of edema, irritability, mucous.  Epiglottis appears normal in shape and size.  The epiglottis is pink in color and does not appear friable or enlarged or covered with mucus.  Focal cords appear symmetric without swelling.  No evidence of paradoxical vocal cords.  Grossly apparent normal function of the vocal cords bilaterally.  Unable to visualize past the vocal cords    Lyanne Co, MD 05/22/13 [phone]

## 2013-05-22 NOTE — ED Notes (Signed)
Pt called out states her breathing feels worse. MD at bedside

## 2013-05-22 NOTE — Progress Notes (Signed)
Utilization Review Completed.Sean Malinowski T11/26/2014  

## 2013-05-22 NOTE — ED Provider Notes (Signed)
Medical screening examination/treatment/procedure(s) were conducted as a shared visit with non-physician practitioner(s) and myself.  I personally evaluated the patient during the encounter.  ECG interpretation   Date: 05/22/2013  Rate: 123  Rhythm: Sinus tachycardia   QRS Axis: normal  Intervals: normal  ST/T Wave abnormalities: normal  Conduction Disutrbances: none  Narrative Interpretation:   Old EKG Reviewed: No prior EKG available   Concerning for upper airway obstruction.  Narrowing noted on the chest x-ray of her upper airway.  Her symptoms seem to be related to her upper or weight self.  Expiratory stridor/harsh breath sounds.  Tachycardic and hypoxic on arrival.  She did not seem to improve with albuterol racemic epinephrine.  Nasopharyngoscopy performed at the bedside which demonstrates normal appearing epiglottis and patent upper airway.  Pulmonary critical care was involved and I appreciate their assistance.  Patient be admitted.  Solu-Medrol given.  Fiberoptic laryngoscopy Date/Time: 05/22/2013 9:08 AM Performed by: Lyanne Co Authorized by: Lyanne Co Consent: Verbal consent obtained. written consent obtained. Risks and benefits: risks, benefits and alternatives were discussed Consent given by: patient Required items: required blood products, implants, devices, and special equipment available Patient identity confirmed: verbally with patient Local anesthesia used: Lidocaine nebulized, 150mg . Patient sedated: no Patient tolerance: Patient tolerated the procedure well with no immediate complications. Comments: Left neck was entered without any difficulty.  Easy visualization of the oropharynx which demonstrates no evidence of edema, irritability, mucous.  Epiglottis appears normal in shape and size.  The epiglottis is pink in color and does not appear friable or enlarged or covered with mucus.  Focal cords appear symmetric without swelling.  No evidence of  paradoxical vocal cords.  Grossly apparent normal function of the vocal cords bilaterally.  Unable to visualize past the vocal cords         Lyanne Co, MD 05/22/13 [phone]

## 2013-05-22 NOTE — ED Notes (Signed)
Dr. Patria Mane and Critical care at bedside for nasopharygoscopy. Consent obtained. Suction, oxygen and airway cart at bedside. Respiratory aware and available if needed.

## 2013-05-22 NOTE — Consult Note (Signed)
Reason for Consult: Stridor and shortness of breath Referring Physician: Pulmonary medicine  Kathryn Griffin is an 57 y.o. female.  HPI: 57 year old who's had a problem with reflux for a long time and has seen GI. She also is having pulmonary issues that has been causing her some breathing difficulties and has seen Dr. Sherene Sires previously. It was determined that there was no specific findings and she had a second opinion at [facility] which they felt like the reflux is causing pulmonary issues as well as nasal issues. She was started on [place] which seemed to improve her overall symptoms. Overall she does not have a persistent and consistent complaint about nasal obstruction or congestion. Occasionally she has some postnasal drip. No purulent drainage. She is on reflux medication. She was seen today in the emergency room with a exacerbation of her pulmonary issue with shortness of breath and a wheezing sound. This started this morning when she awakened. She has not had this problem in over a month. She was evaluated in the emergency room and not found to have any swelling of the upper airway. A consult was made for me to evaluate her while she is symptomatic to be sure there is no upper airway problem.  Past Medical History  Diagnosis Date  . Gestational diabetes   . Reflux     Past Surgical History  Procedure Laterality Date  . Tubal ligation  1995  . Breast cyst excision  1990  . Colposcopy  2009    benign    Family History  Problem Relation Age of Onset  . Cancer Mother     bladder  . Diabetes Brother   . Hypertension Maternal Grandmother   . Ovarian cancer Maternal Grandmother   . Heart disease Maternal Grandmother   . Diabetes Paternal Grandmother   . COPD Father     heavy smoker  . Prostate cancer Father     Social History:  reports that she has never smoked. She has never used smokeless tobacco. She reports that she drinks alcohol. She reports that she does not use illicit  drugs.  Allergies: No Known Allergies  Medications: I have reviewed the patient's current medications.  Results for orders placed during the hospital encounter of 05/22/13 (from the past 48 hour(s))  CBC WITH DIFFERENTIAL     Status: Abnormal   Collection Time    05/22/13  6:54 AM      Result Value Range   WBC 5.4  4.0 - 10.5 K/uL   RBC 4.83  3.87 - 5.11 MIL/uL   Hemoglobin 14.9  12.0 - 15.0 g/dL   HCT 16.1  09.6 - 04.5 %   MCV 90.3  78.0 - 100.0 fL   MCH 30.8  26.0 - 34.0 pg   MCHC 34.2  30.0 - 36.0 g/dL   RDW 40.9  81.1 - 91.4 %   Platelets 307  150 - 400 K/uL   Neutrophils Relative % 26 (*) 43 - 77 %   Lymphocytes Relative 32  12 - 46 %   Monocytes Relative 9  3 - 12 %   Eosinophils Relative 30 (*) 0 - 5 %   Basophils Relative 3 (*) 0 - 1 %   Neutro Abs 1.4 (*) 1.7 - 7.7 K/uL   Lymphs Abs 1.7  0.7 - 4.0 K/uL   Monocytes Absolute 0.5  0.1 - 1.0 K/uL   Eosinophils Absolute 1.6 (*) 0.0 - 0.7 K/uL   Basophils Absolute 0.2 (*) 0.0 -  0.1 K/uL   Smear Review PLATELETS APPEAR ADEQUATE    POCT I-STAT, CHEM 8     Status: Abnormal   Collection Time    05/22/13  7:19 AM      Result Value Range   Sodium 142  135 - 145 mEq/L   Potassium 3.6  3.5 - 5.1 mEq/L   Chloride 103  96 - 112 mEq/L   BUN 12  6 - 23 mg/dL   Creatinine, Ser 1.61  0.50 - 1.10 mg/dL   Glucose, Bld 096 (*) 70 - 99 mg/dL   Calcium, Ion 0.45 (*) 1.12 - 1.23 mmol/L   TCO2 27  0 - 100 mmol/L   Hemoglobin 15.3 (*) 12.0 - 15.0 g/dL   HCT 40.9  81.1 - 91.4 %  COMPREHENSIVE METABOLIC PANEL     Status: Abnormal   Collection Time    05/22/13  9:22 AM      Result Value Range   Sodium 143  135 - 145 mEq/L   Potassium 2.8 (*) 3.5 - 5.1 mEq/L   Comment: DELTA CHECK NOTED   Chloride 104  96 - 112 mEq/L   CO2 27  19 - 32 mEq/L   Glucose, Bld 249 (*) 70 - 99 mg/dL   BUN 12  6 - 23 mg/dL   Creatinine, Ser 7.82  0.50 - 1.10 mg/dL   Comment: DELTA CHECK NOTED   Calcium 9.0  8.4 - 10.5 mg/dL   Total Protein 6.9  6.0 - 8.3  g/dL   Albumin 3.8  3.5 - 5.2 g/dL   AST 20  0 - 37 U/L   ALT 24  0 - 35 U/L   Alkaline Phosphatase 70  39 - 117 U/L   Total Bilirubin 0.3  0.3 - 1.2 mg/dL   GFR calc non Af Amer >90  >90 mL/min   GFR calc Af Amer >90  >90 mL/min   Comment: (NOTE)     The eGFR has been calculated using the CKD EPI equation.     This calculation has not been validated in all clinical situations.     eGFR's persistently <90 mL/min signify possible Chronic Kidney     Disease.  MAGNESIUM     Status: None   Collection Time    05/22/13  9:22 AM      Result Value Range   Magnesium 2.0  1.5 - 2.5 mg/dL  PHOSPHORUS     Status: Abnormal   Collection Time    05/22/13  9:22 AM      Result Value Range   Phosphorus 1.9 (*) 2.3 - 4.6 mg/dL  PROCALCITONIN     Status: None   Collection Time    05/22/13  9:22 AM      Result Value Range   Procalcitonin <0.10     Comment:            Interpretation:     PCT (Procalcitonin) <= 0.5 ng/mL:     Systemic infection (sepsis) is not likely.     Local bacterial infection is possible.     (NOTE)             ICU PCT Algorithm               Non ICU PCT Algorithm        ----------------------------     ------------------------------             PCT < 0.25 ng/mL  PCT < 0.1 ng/mL         Stopping of antibiotics            Stopping of antibiotics           strongly encouraged.               strongly encouraged.        ----------------------------     ------------------------------           PCT level decrease by               PCT < 0.25 ng/mL           >= 80% from peak PCT           OR PCT 0.25 - 0.5 ng/mL          Stopping of antibiotics                                                 encouraged.         Stopping of antibiotics               encouraged.        ----------------------------     ------------------------------           PCT level decrease by              PCT >= 0.25 ng/mL           < 80% from peak PCT            AND PCT >= 0.5 ng/mL             Continuing antibiotics                                                  encouraged.           Continuing antibiotics                encouraged.        ----------------------------     ------------------------------         PCT level increase compared          PCT > 0.5 ng/mL             with peak PCT AND              PCT >= 0.5 ng/mL             Escalation of antibiotics                                              strongly encouraged.          Escalation of antibiotics            strongly encouraged.  PROTIME-INR     Status: None   Collection Time    05/22/13  9:22 AM      Result Value Range   Prothrombin Time 11.7  11.6 - 15.2 seconds   INR 0.87  0.00 - 1.49  APTT     Status: Abnormal  Collection Time    05/22/13  9:22 AM      Result Value Range   aPTT 23 (*) 24 - 37 seconds  GLUCOSE, CAPILLARY     Status: Abnormal   Collection Time    05/22/13 10:19 AM      Result Value Range   Glucose-Capillary 224 (*) 70 - 99 mg/dL    Dg Chest Portable 1 View  05/22/2013   CLINICAL DATA:  Short of breath.  EXAM: PORTABLE CHEST - 1 VIEW  COMPARISON:  10/01/2012  FINDINGS: Telemetry wires partially obscure the right lung base. The cardiomediastinal silhouette is within normal limits. The lungs are well inflated without evidence of focal airspace consolidation, edema, pleural effusion, or pneumothorax. No acute osseous abnormality is identified.  IMPRESSION: No evidence of acute airspace disease.   Electronically Signed   By: Sebastian Ache   On: 05/22/2013 07:21    ROS Blood pressure 102/42, pulse 117, resp. rate 19, height 5\' 7"  (1.702 m), weight 69.7 kg (153 lb 10.6 oz), SpO2 96.00%. Physical Exam  Constitutional: She appears well-developed and well-nourished.  HENT:  Right Ear: External ear normal.  Left Ear: External ear normal.  Nose: Nose normal.  Mouth/Throat: Oropharynx is clear and moist.  She is having a inspiratory and expiratory stridor sound intermittently as she is talking  and breathing. She does have a very productive cough. Fiberoptic exam-nasopharynx, pharynx, and hypopharynx are all without evidence of any swelling or lesions. Both vocal cords move normally with no paradoxically movement. There is some slight interarytenoid edema and a slight pseudo-sulcus of the vocal cords consistent with reflux. There is mucus seen down into the trachea below the subglottis that is obviously moderate when she coughs. The sounds of stridor is definitely coming from the trachea or more inferior as there is no evidence of any swelling or obstruction in the upper airway.  Neck: Normal range of motion. Neck supple.    Assessment/Plan: Stridor-it would appear that her issue is trachea or bronchial tree as she has a lot of mucus that is in the trachea and this clearly is a component of what is causing her stridor. There is no swelling or evidence of any specific etiology in the area above her vocal cords including the vocal cords that would be producing this problem. She was making the stridorous sound while her vocal cords were wide open and I could see some of the mucus in the upper trachea during the exam. This clearly seems like there is a reactive airway tracheitis type problem and she will thus followup with me as needed. I do agree with a CT scan to evaluate the trachea just to make sure there is no structural issues.  Suzanna Obey 05/22/2013, 12:12 PM

## 2013-05-22 NOTE — H&P (Signed)
PULMONARY  / CRITICAL CARE MEDICINE  Name: Kathryn Griffin MRN: [id number] DOB: April 02, 1956    ADMISSION DATE:  05/22/2013   REFERRING MD : EDP PRIMARY SERVICE: PCCM  CHIEF COMPLAINT:  Stirdor  BRIEF PATIENT DESCRIPTION:  57 yo never smoker presents to [facility] ED 2-3 days increasing feeling of fullness throat, greenish brown sputum and UAO. Followed by Dr. Sherene Sires, negative methacholine challenge, Normal spirometry, ENT evaluation per Dr. Annalee Genta consistent with GERD treated with PPI/H2 blockers. Saw another pulm in [place] who concurred.  SIGNIFICANT EVENTS / STUDIES:  Laryngoscopic exam 11-26->no paradaxical cord movement  11-26 ct neck>> LINES / TUBES: none  CULTURES: none  ANTIBIOTICS: none  HISTORY OF PRESENT ILLNESS:  57 yo never smoker presents to [facility] ED 2-3 days increasing feeling of fullness throat, greenish brown sputum and UAO. Followed by Dr. Sherene Sires, negative methacholine challenge, Normal spirometry, ENT evaluation per Dr. Annalee Genta consistent with GERD treated with PPI/H2 blockers. Saw another pulm in [place] who concurred  Symptoms ongoing x 2ds -was taking mucinex - worse on waking up this am, Exp stridor on arrival - received racemic epi, Iv solumedrol & lidocaine neb in ED No overt heartburn on nexium  PAST MEDICAL HISTORY :  Past Medical History  Diagnosis Date  . Gestational diabetes   . Reflux    Past Surgical History  Procedure Laterality Date  . Tubal ligation  1995  . Breast cyst excision  1990  . Colposcopy  2009    benign   Prior to Admission medications   Medication Sig Start Date End Date Taking? Authorizing Provider  albuterol (PROAIR HFA) 108 (90 BASE) MCG/ACT inhaler Inhale 2 puffs into the lungs every 6 (six) hours as needed.    Historical Provider, MD  Calcium Citrate (CITRACAL PO) Take 1 tablet by mouth 2 (two) times daily.    Historical Provider, MD  dextromethorphan-guaiFENesin (MUCINEX DM) 30-600 MG per 12 hr tablet Take 1 tablet by  mouth daily.    Historical Provider, MD  esomeprazole (NEXIUM) 40 MG capsule Take 40 mg by mouth daily.    Historical Provider, MD  famotidine (PEPCID) 20 MG tablet Take 20 mg by mouth at bedtime.    Historical Provider, MD  MAGNESIUM ASPARTATE PO Take 250 mg by mouth daily.    Historical Provider, MD  Multiple Vitamin (MULTIVITAMIN) capsule Take 1 capsule by mouth daily.      Historical Provider, MD  NONFORMULARY OR COMPOUNDED ITEM Estradiol .02% 1 ML Prefilled Applicator Sig: apply vaginally twice a week #90 Day Supply with 4 refills 02/12/13   Ok Edwards, MD  predniSONE (STERAPRED UNI-PAK) 10 MG tablet Prednisone 10 mg take  4 each am x 2 days,   2 each am x 2 days,  1 each am x2days and stop 11/28/12   Nyoka Cowden, MD  traMADol Janean Sark) 50 MG tablet 1-2 every 4 hours as needed for cough or pain 11/28/12   Nyoka Cowden, MD   No Known Allergies  FAMILY HISTORY:  Family History  Problem Relation Age of Onset  . Cancer Mother     bladder  . Diabetes Brother   . Hypertension Maternal Grandmother   . Ovarian cancer Maternal Grandmother   . Heart disease Maternal Grandmother   . Diabetes Paternal Grandmother   . COPD Father     heavy smoker  . Prostate cancer Father    SOCIAL HISTORY:  reports that she has never smoked. She has never used smokeless tobacco. She reports  that she drinks alcohol. She reports that she does not use illicit drugs.  REVIEW OF SYSTEMS:   Constitutional: negative for anorexia, fevers and sweats  Eyes: negative for irritation, redness and visual disturbance  Ears, nose, mouth, throat, and face: negative for earaches, epistaxis, nasal congestion Respiratory: negative for cough, dyspnea on exertion, sputum and wheezing  Cardiovascular: negative for chest pain,  lower extremity edema, orthopnea, palpitations and syncope  Gastrointestinal: negative for abdominal pain, constipation, diarrhea, melena, nausea and vomiting  Genitourinary:negative for dysuria,  frequency and hematuria  Hematologic/lymphatic: negative for bleeding, easy bruising and lymphadenopathy  Musculoskeletal:negative for arthralgias, muscle weakness and stiff joints  Neurological: negative for coordination problems, gait problems, headaches and weakness  Endocrine: negative for diabetic symptoms including polydipsia, polyuria and weight loss    SUBJECTIVE:   VITAL SIGNS: Pulse Rate:  [101-142] 142 (11/26 0730) Resp:  [16-19] 16 (11/26 0730) BP: (114)/(53-55) 114/55 mmHg (11/26 0730) SpO2:  [89 %-100 %] 98 % (11/26 0748) HEMODYNAMICS:   VENTILATOR SETTINGS:   INTAKE / OUTPUT: Intake/Output   None     PHYSICAL EXAMINATION: General:  WDWDWF NAD at rest Neuro:  Intact HEENT: No JVD, NO LAN, laryngeal exam unremarkable except for paradoxical cord movement, mild stridor Cardiovascular: HSR RRR ST Lungs: CTA Abdomen: +bs Musculoskeletal:  intact Skin:  Warm  LABS:  CBC  Recent Labs Lab 05/22/13 0654 05/22/13 0719  WBC 5.4  --   HGB 14.9 15.3*  HCT 43.6 45.0  PLT 307  --    Coag's No results found for this basename: APTT, INR,  in the last 168 hours BMET  Recent Labs Lab 05/22/13 0719  NA 142  K 3.6  CL 103  BUN 12  CREATININE 1.00  GLUCOSE 116*   Electrolytes No results found for this basename: CALCIUM, MG, PHOS,  in the last 168 hours Sepsis Markers No results found for this basename: LATICACIDVEN, PROCALCITON, O2SATVEN,  in the last 168 hours ABG No results found for this basename: PHART, PCO2ART, PO2ART,  in the last 168 hours Liver Enzymes No results found for this basename: AST, ALT, ALKPHOS, BILITOT, ALBUMIN,  in the last 168 hours Cardiac Enzymes No results found for this basename: TROPONINI, PROBNP,  in the last 168 hours Glucose No results found for this basename: GLUCAP,  in the last 168 hours  Imaging Dg Chest Portable 1 View  05/22/2013   CLINICAL DATA:  Short of breath.  EXAM: PORTABLE CHEST - 1 VIEW  COMPARISON:   10/01/2012  FINDINGS: Telemetry wires partially obscure the right lung base. The cardiomediastinal silhouette is within normal limits. The lungs are well inflated without evidence of focal airspace consolidation, edema, pleural effusion, or pneumothorax. No acute osseous abnormality is identified.  IMPRESSION: No evidence of acute airspace disease.   Electronically Signed   By: Sebastian Ache   On: 05/22/2013 07:21       ASSESSMENT / PLAN:  PULMONARY A:UAO presumed component of VCD -although no paradoxical vocal cord motion on fiber optic exam  P:   -Admit to ICU x 24 hours -steroids IV -PPI - h2 blocker -mucolytic  -CT neck rule out extrathoracic tracheal stenosis  -ENT eval -d/w Dr Jearld Fenton -I was present during fiber optic exam by ER attending  CARDIOVASCULAR A: Sinus tachycardia P:  -Monitor most likely   RENAL A: No acute issue P:     GASTROINTESTINAL A:  GERD P:   PPI bid +h2 blocker  HEMATOLOGIC A:  No acute issue P:  INFECTIOUS A:  NO overt infectious process P:     ENDOCRINE A:  No acute issue(will be on steroids)  P:   -monitor glucose  NEUROLOGIC A:  Anxious but intact P:   -Monitor  TODAY'S SUMMARY:  57 yo never smoker presents to [facility] ED 2-3 days increasing feeling of fullness throat, greenish brown sputum and UAO. Followed by Dr. Sherene Sires, negative [facility], Normal spirometry, ENT evaluation per Dr. Annalee Genta consistent with GERD treated with PPI/H2 blockers.   Brett Canales Minor ACNP [facility] PCCM Pager [phone] till 3 pm If no answer page [phone]  Independently examined pt, evaluated data & formulated above care plan with NP who scribed this note & edited by me.  ALVA,RAKESH V.  05/22/2013, 8:54 AM

## 2013-05-22 NOTE — ED Notes (Signed)
RT and PA into room to examine pt.  Will begin hr long HHN.  Pt feels she has the sensation of choking.

## 2013-05-23 ENCOUNTER — Inpatient Hospital Stay (HOSPITAL_COMMUNITY): Payer: BC Managed Care – PPO

## 2013-05-23 DIAGNOSIS — R05 Cough: Secondary | ICD-10-CM

## 2013-05-23 DIAGNOSIS — R059 Cough, unspecified: Secondary | ICD-10-CM

## 2013-05-23 LAB — BASIC METABOLIC PANEL
BUN: 11 mg/dL (ref 6–23)
CO2: 25 mEq/L (ref 19–32)
Calcium: 9.5 mg/dL (ref 8.4–10.5)
Chloride: 108 mEq/L (ref 96–112)
Creatinine, Ser: 0.52 mg/dL (ref 0.50–1.10)
GFR calc Af Amer: >90 mL/min (ref >90)
GFR calc non Af Amer: >90 mL/min (ref >90)
Glucose, Bld: 135 mg/dL — ABNORMAL HIGH (ref 70–99)
Potassium: 4.7 mEq/L (ref 3.5–5.1)
Sodium: 142 mEq/L (ref 135–145)

## 2013-05-23 LAB — GLUCOSE, CAPILLARY
Glucose-Capillary: 102 mg/dL — ABNORMAL HIGH (ref 70–99)
Glucose-Capillary: 104 mg/dL — ABNORMAL HIGH (ref 70–99)
Glucose-Capillary: 124 mg/dL — ABNORMAL HIGH (ref 70–99)
Glucose-Capillary: 129 mg/dL — ABNORMAL HIGH (ref 70–99)
Glucose-Capillary: 131 mg/dL — ABNORMAL HIGH (ref 70–99)
Glucose-Capillary: 85 mg/dL (ref 70–99)

## 2013-05-23 LAB — CBC
HCT: 41.2 % (ref 36.0–46.0)
Hemoglobin: 13.9 g/dL (ref 12.0–15.0)
MCH: 31.1 pg (ref 26.0–34.0)
MCHC: 33.7 g/dL (ref 30.0–36.0)
MCV: 92.2 fL (ref 78.0–100.0)
Platelets: 309 10*3/uL (ref 150–400)
RBC: 4.47 MIL/uL (ref 3.87–5.11)
RDW: 13.7 % (ref 11.5–15.5)
WBC: 14.1 10*3/uL — ABNORMAL HIGH (ref 4.0–10.5)

## 2013-05-23 MED ORDER — ACETYLCYSTEINE 20 % IN SOLN
3.0000 mL | RESPIRATORY_TRACT | Status: DC
  Administered 2013-05-23 – 2013-05-24 (×4): 3 mL via RESPIRATORY_TRACT
  Filled 2013-05-23 (×13): qty 4

## 2013-05-23 MED ORDER — ALBUTEROL SULFATE (5 MG/ML) 0.5% IN NEBU
2.5000 mg | INHALATION_SOLUTION | RESPIRATORY_TRACT | Status: DC
  Administered 2013-05-23 – 2013-05-25 (×9): 2.5 mg via RESPIRATORY_TRACT
  Filled 2013-05-23 (×9): qty 0.5

## 2013-05-23 NOTE — Progress Notes (Signed)
PULMONARY  / CRITICAL CARE MEDICINE  Name: Kathryn Griffin MRN: [id number] DOB: 10/16/1955    ADMISSION DATE:  05/22/2013   REFERRING MD : EDP PRIMARY SERVICE: PCCM  CHIEF COMPLAINT:  Stirdor  BRIEF PATIENT DESCRIPTION:  57 yo never smoker presents to [facility] ED 2-3 days increasing feeling of fullness throat, greenish brown sputum and UAO. Followed by Dr. Sherene Sires, negative methacholine challenge, Normal spirometry, ENT evaluation per Dr. Annalee Genta consistent with GERD treated with PPI/H2 blockers. Saw another pulm in [place] who concurred.  SIGNIFICANT EVENTS / STUDIES:  Laryngoscopic exam 11-26->no paradaxical cord movement  11-26 ct neck>> No acute findings within the neck, recommend CT chest  11/26 ENT consulted, feel this is below the vocal cords  11/27 CT chest/trachea -   LINES / TUBES: none  CULTURES: none  ANTIBIOTICS: None  SUBJECTIVE:   VITAL SIGNS: Temp:  [97.7 F (36.5 C)-98.8 F (37.1 C)] 98.7 F (37.1 C) (11/27 0339) Pulse Rate:  [81-144] 88 (11/27 0700) Resp:  [16-26] 17 (11/27 0700) BP: (88-110)/(34-57) 99/45 mmHg (11/27 0700) SpO2:  [90 %-100 %] 99 % (11/27 0700) Weight:  [153 lb 10.6 oz (69.7 kg)-154 lb 5.2 oz (70 kg)] 154 lb 5.2 oz (70 kg) (11/27 0405) HEMODYNAMICS:   VENTILATOR SETTINGS:   INTAKE / OUTPUT: Intake/Output     11/26 0701 - 11/27 0700 11/27 0701 - 11/28 0700   I.V. (mL/kg) 854.2 (12.2)    IV Piggyback 304.6    Total Intake(mL/kg) 1158.7 (16.6)    Urine (mL/kg/hr) 1150 (0.7)    Total Output 1150     Net +8.7          Urine Occurrence 3 x      PHYSICAL EXAMINATION: General:   NAD at rest Neuro:  Intact HEENT: No JVD, NO LAN, mild stridor Cardiovascular:  RRR Lungs: CTA Abdomen: +bs Musculoskeletal:  intact Skin:  Warm  LABS:  CBC  Recent Labs Lab 05/22/13 0654 05/22/13 0719 05/23/13 0416  WBC 5.4  --  14.1*  HGB 14.9 15.3* 13.9  HCT 43.6 45.0 41.2  PLT 307  --  309   Coag's  Recent Labs Lab 05/22/13 0922   APTT 23*  INR 0.87   BMET  Recent Labs Lab 05/22/13 0719 05/22/13 0922 05/23/13 0416  NA 142 143 142  K 3.6 2.8* 4.7  CL 103 104 108  CO2  --  27 25  BUN 12 12 11   CREATININE 1.00 0.61 0.52  GLUCOSE 116* 249* 135*   Electrolytes  Recent Labs Lab 05/22/13 0922 05/23/13 0416  CALCIUM 9.0 9.5  MG 2.0  --   PHOS 1.9*  --    Sepsis Markers  Recent Labs Lab 05/22/13 0922  PROCALCITON <0.10   ABG No results found for this basename: PHART, PCO2ART, PO2ART,  in the last 168 hours Liver Enzymes  Recent Labs Lab 05/22/13 0922  AST 20  ALT 24  ALKPHOS 70  BILITOT 0.3  ALBUMIN 3.8   Cardiac Enzymes No results found for this basename: TROPONINI, PROBNP,  in the last 168 hours Glucose  Recent Labs Lab 05/22/13 1019 05/22/13 1543 05/22/13 1915 05/23/13 0020 05/23/13 0337  GLUCAP 224* 150* 149* 131* 124*    Imaging Ct Soft Tissue Neck Wo Contrast  05/22/2013   CLINICAL DATA:  Stridor.  EXAM: CT NECK WITHOUT CONTRAST  TECHNIQUE: Multidetector CT imaging of the neck was performed following the standard protocol without intravenous contrast.  COMPARISON:  05/21/2010  FINDINGS: Airways patent. Epiglottis and  aryepiglottic folds are normal. No cervical adenopathy. Thyroid and salivary glands are unremarkable.  Paranasal sinuses are clear.  Areas of scarring in the lung apices. Ground-glass opacities partially imaged within the visualized left upper lobe. There appears to be volume loss in the left upper lobe as the superior segment of the left lower lobe extends to the apex. Consider further evaluation with chest CT to exclude a central obstructing process or a left upper lobe pneumonia.  IMPRESSION: No acute findings within the neck.  Partially imaged in the apices is ground-glass opacity within the left upper lobe with apparent volume loss. Consider further evaluation with chest CT to exclude a central obstructing process or a left upper lobe pneumonia.   Electronically  Signed   By: Charlett Nose M.D.   On: 05/22/2013 13:34   Dg Chest Portable 1 View  05/22/2013   CLINICAL DATA:  Short of breath.  EXAM: PORTABLE CHEST - 1 VIEW  COMPARISON:  10/01/2012  FINDINGS: Telemetry wires partially obscure the right lung base. The cardiomediastinal silhouette is within normal limits. The lungs are well inflated without evidence of focal airspace consolidation, edema, pleural effusion, or pneumothorax. No acute osseous abnormality is identified.  IMPRESSION: No evidence of acute airspace disease.   Electronically Signed   By: Sebastian Ache   On: 05/22/2013 07:21   ASSESSMENT / PLAN:  PULMONARY A:UAO - ENT believes below level of VC.  CT neck reassuring, recommend CT chest/trachea  P:   - Steroids IV. - PPI, H2 blocker. - Mucolytic PO. - CT neck r/o neck involvement, recommend CT chest/trachea for UAO process. - ENT eval -d/w Dr Jearld Fenton, appreciate recs. - Nebulize mucomyst and albuterol. - Bronch in AM.  CARDIOVASCULAR A: Sinus tachycardia - Resolved  P:  - Monitor.  RENAL A: No acute issue P:   - Monitor.  GASTROINTESTINAL A:  GERD P:   - PPI bid +h2 blocker.  HEMATOLOGIC A:  No acute issue P:  - Monitor.  INFECTIOUS A:  Leukocytosis - Secondary to steroid demarginalization  P:   - Monitor.  ENDOCRINE A:  No acute issue(will be on steroids)  P:   - Monitor glucose.  NEUROLOGIC A:  Anxious but intact P:   -Monitor  TODAY'S SUMMARY:  CT chest/trachea to evaluate for UAO.  Twana First Hess, DO of [facility] 05/23/2013, 8:27 AM  Patient's airway appears stable, ENT saw and likely to be VCD, upper laryngoscopy, chest CT with an obstruction of airway, will nebulize mucomyst and albuterol, will bronch in AM.  Patient seen and examined, agree with above note.  I dictated the care and orders written for this patient under my direction.  Alyson Reedy, MD [phone]

## 2013-05-24 ENCOUNTER — Inpatient Hospital Stay (HOSPITAL_COMMUNITY): Payer: BC Managed Care – PPO

## 2013-05-24 ENCOUNTER — Other Ambulatory Visit: Payer: Self-pay | Admitting: Pulmonary Disease

## 2013-05-24 DIAGNOSIS — J9600 Acute respiratory failure, unspecified whether with hypoxia or hypercapnia: Secondary | ICD-10-CM

## 2013-05-24 LAB — GLUCOSE, CAPILLARY
Glucose-Capillary: 111 mg/dL — ABNORMAL HIGH (ref 70–99)
Glucose-Capillary: 121 mg/dL — ABNORMAL HIGH (ref 70–99)
Glucose-Capillary: 136 mg/dL — ABNORMAL HIGH (ref 70–99)
Glucose-Capillary: 144 mg/dL — ABNORMAL HIGH (ref 70–99)
Glucose-Capillary: 98 mg/dL (ref 70–99)

## 2013-05-24 LAB — MAGNESIUM: Magnesium: 2.5 mg/dL (ref 1.5–2.5)

## 2013-05-24 LAB — CBC WITH DIFFERENTIAL/PLATELET
Basophils Absolute: 0 10*3/uL (ref 0.0–0.1)
Basophils Relative: 0 % (ref 0–1)
Eosinophils Absolute: 0 10*3/uL (ref 0.0–0.7)
Eosinophils Relative: 0 % (ref 0–5)
HCT: 42.3 % (ref 36.0–46.0)
Hemoglobin: 14.2 g/dL (ref 12.0–15.0)
Lymphocytes Relative: 3 % — ABNORMAL LOW (ref 12–46)
Lymphs Abs: 0.4 10*3/uL — ABNORMAL LOW (ref 0.7–4.0)
MCH: 30.9 pg (ref 26.0–34.0)
MCHC: 33.6 g/dL (ref 30.0–36.0)
MCV: 92.2 fL (ref 78.0–100.0)
Monocytes Absolute: 1.2 10*3/uL — ABNORMAL HIGH (ref 0.1–1.0)
Monocytes Relative: 7 % (ref 3–12)
Neutro Abs: 14.9 10*3/uL — ABNORMAL HIGH (ref 1.7–7.7)
Neutrophils Relative %: 90 % — ABNORMAL HIGH (ref 43–77)
Platelets: 332 10*3/uL (ref 150–400)
RBC: 4.59 MIL/uL (ref 3.87–5.11)
RDW: 13.9 % (ref 11.5–15.5)
WBC: 16.5 10*3/uL — ABNORMAL HIGH (ref 4.0–10.5)

## 2013-05-24 LAB — BASIC METABOLIC PANEL
BUN: 18 mg/dL (ref 6–23)
CO2: 25 mEq/L (ref 19–32)
Calcium: 8.9 mg/dL (ref 8.4–10.5)
Chloride: 106 mEq/L (ref 96–112)
Creatinine, Ser: 0.51 mg/dL (ref 0.50–1.10)
GFR calc Af Amer: >90 mL/min (ref >90)
GFR calc non Af Amer: >90 mL/min (ref >90)
Glucose, Bld: 139 mg/dL — ABNORMAL HIGH (ref 70–99)
Potassium: 4 mEq/L (ref 3.5–5.1)
Sodium: 141 mEq/L (ref 135–145)

## 2013-05-24 LAB — CBC
HCT: 38 % (ref 36.0–46.0)
Hemoglobin: 12.7 g/dL (ref 12.0–15.0)
MCH: 30.6 pg (ref 26.0–34.0)
MCHC: 33.4 g/dL (ref 30.0–36.0)
MCV: 91.6 fL (ref 78.0–100.0)
Platelets: 308 10*3/uL (ref 150–400)
RBC: 4.15 MIL/uL (ref 3.87–5.11)
RDW: 13.8 % (ref 11.5–15.5)
WBC: 13.6 10*3/uL — ABNORMAL HIGH (ref 4.0–10.5)

## 2013-05-24 LAB — PHOSPHORUS: Phosphorus: 4 mg/dL (ref 2.3–4.6)

## 2013-05-24 LAB — IGE: IgE (Immunoglobulin E), Serum: 158.2 IU/mL (ref 0.0–180.0)

## 2013-05-24 MED ORDER — MIDAZOLAM HCL 2 MG/2ML IJ SOLN
2.0000 mg | Freq: Once | INTRAMUSCULAR | Status: AC
  Administered 2013-05-24: 2 mg via INTRAVENOUS

## 2013-05-24 MED ORDER — MIDAZOLAM HCL 2 MG/2ML IJ SOLN
4.0000 mg | Freq: Once | INTRAMUSCULAR | Status: AC
  Administered 2013-05-24: 4 mg via INTRAVENOUS

## 2013-05-24 MED ORDER — ROCURONIUM BROMIDE 50 MG/5ML IV SOLN
50.0000 mg | Freq: Once | INTRAVENOUS | Status: AC
  Administered 2013-05-24: 50 mg via INTRAVENOUS

## 2013-05-24 MED ORDER — FENTANYL CITRATE 0.05 MG/ML IJ SOLN
INTRAMUSCULAR | Status: AC
  Filled 2013-05-24: qty 4

## 2013-05-24 MED ORDER — ALBUTEROL SULFATE (5 MG/ML) 0.5% IN NEBU: 2.5000 mg | INHALATION_SOLUTION | RESPIRATORY_TRACT | Status: DC | PRN

## 2013-05-24 MED ORDER — MIDAZOLAM HCL 2 MG/2ML IJ SOLN
INTRAMUSCULAR | Status: AC
  Filled 2013-05-24: qty 4

## 2013-05-24 MED ORDER — FENTANYL CITRATE 0.05 MG/ML IJ SOLN
100.0000 ug | Freq: Once | INTRAMUSCULAR | Status: AC
  Administered 2013-05-24: 100 ug via INTRAVENOUS

## 2013-05-24 MED ORDER — ETOMIDATE 2 MG/ML IV SOLN
20.0000 mg | Freq: Once | INTRAVENOUS | Status: AC
  Administered 2013-05-24: 20 mg via INTRAVENOUS

## 2013-05-24 MED ORDER — FENTANYL CITRATE 0.05 MG/ML IJ SOLN
INTRAMUSCULAR | Status: AC
  Administered 2013-05-24: 100 ug
  Filled 2013-05-24: qty 4

## 2013-05-24 MED ORDER — MIDAZOLAM HCL 2 MG/2ML IJ SOLN: 4.0000 mg | Freq: Once | INTRAMUSCULAR | Status: DC

## 2013-05-24 MED ORDER — ETOMIDATE 2 MG/ML IV SOLN
INTRAVENOUS | Status: AC
  Filled 2013-05-24: qty 10

## 2013-05-24 NOTE — Progress Notes (Signed)
Video bronchoscopy procedure performed. Bronchial washing intervention performed. 

## 2013-05-24 NOTE — Procedures (Signed)
Bronchoscopy Procedure Note Kathryn Griffin [id number] 1956-05-23  Procedure: Bronchoscopy Indications: Diagnostic evaluation of the airways, Obtain specimens for culture and/or other diagnostic studies and Remove secretions  Procedure Details Consent: Risks of procedure as well as the alternatives and risks of each were explained to the (patient/caregiver).  Consent for procedure obtained. Time Out: Verified patient identification, verified procedure, site/side was marked, verified correct patient position, special equipment/implants available, medications/allergies/relevent history reviewed, required imaging and test results available.  Performed  In preparation for procedure, Patient was intubated for procedure.. Sedation: Benzodiazepines, Muscle relaxants and Etomidate  Airway entered and the following bronchi were examined: RUL, RML, RLL, LUL, LLL and Bronchi.   Procedures performed: LUL, LLL and RML were all obstructed with very thick secretions originally thought were masses.  Biopsies of those were taken x3, BAL from all three spots as well and brushed.  All secretions were removed and were patent post removal of secretions. Bronchoscope removed.    Evaluation Hemodynamic Status: BP stable throughout; O2 sats: stable throughout Patient's Current Condition: stable Specimens:  Sent purulent fluid Complications: No apparent complications Patient did tolerate procedure well.   Koren Bound 05/24/2013

## 2013-05-24 NOTE — Procedures (Signed)
Bedside Bronchoscopy Procedure Note Kathryn Griffin [id number] 1955/09/21  Procedure: Bronchoscopy Indications: Diagnostic evaluation of the airways, Obtain specimens for culture and/or other diagnostic studies and Remove secretions  Procedure Details: ET Tube Size:8.0 Bite block in place: No, paralytic used In preparation for procedure, Patient hyper-oxygenated with 100 % FiO2 Airway entered and the following bronchi were examined: RUL, RML, RLL, LUL, LLL and Bronchi.   Bronchoscope removed.  , Patient placed back on 100% FiO2 at conclusion of procedure.    Evaluation BP 103/56  Pulse 101  Temp(Src) 98.6 F (37 C) (Oral)  Resp 18  Ht 5\' 7"  (1.702 m)  Wt 153 lb 3.5 oz (69.5 kg)  BMI 23.99 kg/m2  SpO2 93% Breath Sounds:Diminished O2 sats: stable throughout Patient's Current Condition: stable Complications: Complications of unable to remove secretions without intubation. pt intuibated with 8.0 ETT and bronch procedure resumed via ETT Patient did tolerate procedure well.   Leonard Downing 05/24/2013, 10:29 AM

## 2013-05-24 NOTE — Progress Notes (Addendum)
PULMONARY  / CRITICAL CARE MEDICINE  Name: Kathryn Griffin MRN: [id number] DOB: [birth year]    ADMISSION DATE:  05/22/2013   REFERRING MD : EDP PRIMARY SERVICE: PCCM  CHIEF COMPLAINT:  [place]  BRIEF PATIENT DESCRIPTION:  57 yo never smoker presents to [facility] ED 2-3 days increasing feeling of fullness throat, greenish brown sputum and UAO. Followed by Dr. Sherene Sires, negative methacholine challenge, Normal spirometry, ENT evaluation per Dr. Annalee Genta consistent with GERD treated with PPI/H2 blockers. Saw another pulm in [place] who concurred.  SIGNIFICANT EVENTS / STUDIES:  Laryngoscopic exam 11-26->no paradaxical cord movement  11-26 ct neck>> No acute findings within the neck, recommend CT chest  11/26 ENT consulted, feel this is below the vocal cords  11/27 CT chest/trachea -   LINES / TUBES: PIV  CULTURES: BAL 11/28>>>  ANTIBIOTICS: None  SUBJECTIVE:   VITAL SIGNS: Temp:  [97.5 F (36.4 C)-98.6 F (37 C)] 98.6 F (37 C) (11/28 0350) Pulse Rate:  [72-118] 72 (11/28 1200) Resp:  [12-29] 17 (11/28 1200) BP: (91-139)/(41-89) 101/53 mmHg (11/28 1200) SpO2:  [81 %-100 %] 94 % (11/28 1115) Weight:  [69.5 kg (153 lb 3.5 oz)] 69.5 kg (153 lb 3.5 oz) (11/28 0600) HEMODYNAMICS:   VENTILATOR SETTINGS:   INTAKE / OUTPUT: Intake/Output     11/27 0701 - 11/28 0700 11/28 0701 - 11/29 0700   P.O. 720    I.V. (mL/kg) 1200 (17.3) 200 (2.9)   IV Piggyback     Total Intake(mL/kg) 1920 (27.6) 200 (2.9)   Urine (mL/kg/hr) 600 (0.4) 600 (1.7)   Total Output 600 600   Net +1320 -400        Urine Occurrence 5 x      PHYSICAL EXAMINATION: General:   NAD at rest, on vent. Neuro:  Intact HEENT: No JVD, NO LAN, mild stridor Cardiovascular:  RRR Lungs: CTA Abdomen: +bs Musculoskeletal:  intact Skin:  Warm  LABS:  CBC  Recent Labs Lab 05/22/13 0654 05/22/13 0719 05/23/13 0416 05/24/13 0351  WBC 5.4  --  14.1* 13.6*  HGB 14.9 15.3* 13.9 12.7  HCT 43.6 45.0 41.2 38.0  PLT  307  --  309 308   Coag's  Recent Labs Lab 05/22/13 0922  APTT 23*  INR 0.87   BMET  Recent Labs Lab 05/22/13 0922 05/23/13 0416 05/24/13 0351  NA 143 142 141  K 2.8* 4.7 4.0  CL 104 108 106  CO2 27 25 25   BUN 12 11 18   CREATININE 0.61 0.52 0.51  GLUCOSE 249* 135* 139*   Electrolytes  Recent Labs Lab 05/22/13 0922 05/23/13 0416 05/24/13 0351  CALCIUM 9.0 9.5 8.9  MG 2.0  --  2.5  PHOS 1.9*  --  4.0   Sepsis Markers  Recent Labs Lab 05/22/13 0922  PROCALCITON <0.10   ABG No results found for this basename: PHART, PCO2ART, PO2ART,  in the last 168 hours Liver Enzymes  Recent Labs Lab 05/22/13 0922  AST 20  ALT 24  ALKPHOS 70  BILITOT 0.3  ALBUMIN 3.8   Cardiac Enzymes No results found for this basename: TROPONINI, PROBNP,  in the last 168 hours Glucose  Recent Labs Lab 05/23/13 1608 05/23/13 2027 05/24/13 0024 05/24/13 0347 05/24/13 0823 05/24/13 1138  GLUCAP 102* 129* 136* 144* 111* 121*    Imaging Ct Soft Tissue Neck Wo Contrast  05/22/2013   CLINICAL DATA:  Stridor.  EXAM: CT NECK WITHOUT CONTRAST  TECHNIQUE: Multidetector CT imaging of the neck was performed following  the standard protocol without intravenous contrast.  COMPARISON:  05/21/2010  FINDINGS: Airways patent. Epiglottis and aryepiglottic folds are normal. No cervical adenopathy. Thyroid and salivary glands are unremarkable.  Paranasal sinuses are clear.  Areas of scarring in the lung apices. Ground-glass opacities partially imaged within the visualized left upper lobe. There appears to be volume loss in the left upper lobe as the superior segment of the left lower lobe extends to the apex. Consider further evaluation with chest CT to exclude a central obstructing process or a left upper lobe pneumonia.  IMPRESSION: No acute findings within the neck.  Partially imaged in the apices is ground-glass opacity within the left upper lobe with apparent volume loss. Consider further  evaluation with chest CT to exclude a central obstructing process or a left upper lobe pneumonia.   Electronically Signed   By: Charlett Nose M.D.   On: 05/22/2013 13:34   Ct Chest Wo Contrast  05/23/2013   CLINICAL DATA:  Stridor.  EXAM: CT CHEST WITHOUT CONTRAST  TECHNIQUE: Multidetector CT imaging of the chest was performed following the standard protocol without IV contrast.  COMPARISON:  05/22/2013 neck CT.  FINDINGS: Left upper lobe consolidation/ atelectasis has progressed since the recent neck CT. Complete consolidation left upper lobe with over extension of the left lower lobe. Left upper lobe bronchi partially visualized although regions of left upper lobe bronchus collapse/narrowing also noted. Within the left mainstem bronchus, material is noted which may represent mucous. Tumor not entirely excluded. Bronchoscopy may prove helpful for further delineation.  Right upper lobe/ apical pleural thickening mild nodularity. Minimal nodularity anterior inferior aspect of the over expanded left lower lobe. Minimal parenchymal changes right lower lobe. Findings may represent result of scarring and atelectasis however, to confirm stability, follow-up chest CT in 3-6 months recommended.  Top-normal size mediastinal lymph nodes.  Heart size within normal limits.  Ascending thoracic aorta within normal limits measuring up to 3.3 cm. Calcification undersurface of the aortic arch.  Dense breast parenchyma with coarse calcifications on the right. Correlation with mammography recommended.  Limited imaging of upper abdominal structures reveals minimally nodular of the liver without definitive findings to suggest cirrhosis.  No bony destructive lesion.  IMPRESSION: Left upper lobe consolidation/ atelectasis has progressed since the recent neck CT. Complete consolidation left upper lobe with over extension of the left lower lobe. Left upper lobe bronchi partially visualized although regions of left upper lobe bronchus  collapse/narrowing also noted. Within the left mainstem bronchus, material is noted which may represent mucous. Tumor not entirely excluded. Bronchoscopy may prove helpful for further delineation.  Right upper lobe/ apical pleural thickening mild nodularity. Minimal nodularity anterior inferior aspect of the over expanded left lower lobe. Minimal parenchymal changes right lower lobe. Findings may represent result of scarring and atelectasis however, to confirm stability, follow-up chest CT in 3-6 months recommended.  These results were called by telephone at the time of interpretation on 05/23/2013 at 9:34 AM to [facility] patient's nurse , who verbally acknowledged these results.   Electronically Signed   By: Bridgett Larsson M.D.   On: 05/23/2013 09:49   Dg Chest Port 1 View  05/24/2013   CLINICAL DATA:  Left upper lobe collapse, shortness of breath  EXAM: PORTABLE CHEST - 1 VIEW  COMPARISON:  05/22/2013, 05/23/2013  FINDINGS: Wedge-shaped consolidation noted along the medial left hemi thorax obscuring the cardiac silhouette and left hemidiaphragm compatible with complete left upper lobe collapse as well as a portion of the left  lower lobe. Right lung remains clear. Volume loss in the left chest with slight mediastinal shift to the left. No pneumothorax.  IMPRESSION: Left upper lobe collapse and partial left lower lobe collapse, slightly worse compared to 05/23/2013   Electronically Signed   By: Ruel Favors M.D.   On: 05/24/2013 07:53   ASSESSMENT / PLAN:  PULMONARY A:UAO - ENT believes below level of VC.  CT neck reassuring, recommend CT chest/trachea.  On bronch, copious and very thick secretions that were very difficult to remove.  ?history of aspergillosis and asthma. P:   - Steroids IV, will d/c today. - PPI, D/C H2 blocker. - Mucolytic PO. - CT neck r/o neck involvement, recommend CT chest/trachea for UAO process. - ENT eval -d/w Dr Jearld Fenton, appreciate recs. - Bronch performed, intubated for bronch  given thickness and how copious secretions were, will extubate. - All secretions removed and BAL sent. - History of aspergillosis, will send BAL for culture, will check IgE level.  CARDIOVASCULAR A: Sinus tachycardia - Resolved  P:  - Monitor.  RENAL A: No acute issue P:   - Monitor.  GASTROINTESTINAL A:  GERD P:   - PPI bid +h2 blocker.  HEMATOLOGIC A:  No acute issue P:  - Monitor.  INFECTIOUS A:  Leukocytosis - Secondary to steroid demarginalization  P:   - Monitor.  ENDOCRINE A:  No acute issue(will be on steroids)  P:   - Monitor glucose.  NEUROLOGIC A:  Anxious but intact P:   -Monitor  TODAY'S SUMMARY:  Intubated after noticing how thick the secretions were.  All secretions removed.  Will extubate.  Will need aggressive mucolytic therapy.  No history of CF.  ?history of ABPA.  Will check IgE and aspergillus ab.  If positive will need treatment.  Will likely d/c in AM.  CC time 50 min.  Alyson Reedy, M.D. [facility] Pulmonary/Critical Care Medicine. Pager: [phone]. After hours pager: [phone].

## 2013-05-24 NOTE — Progress Notes (Signed)
Pt intubated by MD prior to bronchoscopy. Pt intubated with #8ETT, placed on ACVC- VT 500, RR 20, Fio2 100%, peep 5 per MD.

## 2013-05-24 NOTE — Procedures (Signed)
Extubation Procedure Note  Patient Details:   Name: Kathryn Griffin DOB: [birth year] MRN: [id number]   Airway Documentation:     Evaluation  O2 sats: stable throughout Complications: No apparent complications Patient did tolerate procedure well. Bilateral Breath Sounds: Clear;Diminished;Expiratory wheezes   Yes DR. Yacoub extubated patient to RA patient stable at this time.  Morley Kos 05/24/2013, 11:12 AM

## 2013-05-24 NOTE — Progress Notes (Signed)
eLink Physician-Brief Progress Note Patient Name: Kathryn Griffin DOB: [birth year] MRN: [id number]  Date of Service  05/24/2013   HPI/Events of Note   Patient without respiratory distress. Request to stop inhaled mucomyst.   eICU Interventions  Mucomyst held overnight.       Advika Mclelland R. 05/24/2013, 8:45 PM

## 2013-05-24 NOTE — Procedures (Signed)
Intubation Procedure Note Kathryn Griffin [id number] April 23, 1956  Procedure: Intubation Indications: Prior to bronchoscopy  Procedure Details Consent: Unable to obtain consent because of altered level of consciousness. Time Out: Verified patient identification, verified procedure, site/side was marked, verified correct patient position, special equipment/implants available, medications/allergies/relevent history reviewed, required imaging and test results available.  Performed  Maximum sterile technique was used including antiseptics, gloves, hand hygiene and mask.  MAC    Evaluation Hemodynamic Status: BP stable throughout; O2 sats: stable throughout Patient's Current Condition: stable Complications: No apparent complications Patient did tolerate procedure well. Verify placement.  tube position acceptable bronchoscopically.   Koren Bound 05/24/2013

## 2013-05-25 ENCOUNTER — Inpatient Hospital Stay (HOSPITAL_COMMUNITY): Payer: BC Managed Care – PPO

## 2013-05-25 DIAGNOSIS — J9600 Acute respiratory failure, unspecified whether with hypoxia or hypercapnia: Secondary | ICD-10-CM

## 2013-05-25 LAB — CBC
HCT: 40.9 % (ref 36.0–46.0)
Hemoglobin: 13.8 g/dL (ref 12.0–15.0)
MCH: 31.1 pg (ref 26.0–34.0)
MCHC: 33.7 g/dL (ref 30.0–36.0)
MCV: 92.1 fL (ref 78.0–100.0)
Platelets: 289 10*3/uL (ref 150–400)
RBC: 4.44 MIL/uL (ref 3.87–5.11)
RDW: 13.8 % (ref 11.5–15.5)
WBC: 13.6 10*3/uL — ABNORMAL HIGH (ref 4.0–10.5)

## 2013-05-25 LAB — BASIC METABOLIC PANEL
BUN: 16 mg/dL (ref 6–23)
CO2: 27 mEq/L (ref 19–32)
Calcium: 8.8 mg/dL (ref 8.4–10.5)
Chloride: 106 mEq/L (ref 96–112)
Creatinine, Ser: 0.62 mg/dL (ref 0.50–1.10)
GFR calc Af Amer: >90 mL/min (ref >90)
GFR calc non Af Amer: >90 mL/min (ref >90)
Glucose, Bld: 77 mg/dL (ref 70–99)
Potassium: 4.1 mEq/L (ref 3.5–5.1)
Sodium: 140 mEq/L (ref 135–145)

## 2013-05-25 LAB — MAGNESIUM: Magnesium: 2.4 mg/dL (ref 1.5–2.5)

## 2013-05-25 LAB — PHOSPHORUS: Phosphorus: 3.1 mg/dL (ref 2.3–4.6)

## 2013-05-25 MED ORDER — DM-GUAIFENESIN ER 30-600 MG PO TB12: 1.0000 | ORAL_TABLET | Freq: Two times a day (BID) | ORAL | Status: DC

## 2013-05-25 NOTE — Progress Notes (Signed)
Pt received discharge instructions from Canary Brim NP. Pt IV removed. Pt husband here to pick pt up and bring home. Pt escorted down to main entrance. Barbaraann Faster RN

## 2013-05-25 NOTE — Discharge Summary (Signed)
Patient seen and examined, agree with above note.  I dictated the care and orders written for this patient under my direction.  Symeon Puleo G Oliana Gowens, MD [phone] 

## 2013-05-25 NOTE — Discharge Summary (Signed)
Physician Discharge Summary  Patient ID: [name] MRN: [id number] DOB/AGE: Dec 23, 1955 57 y.o.  Admit date: 05/22/2013 Discharge date: 05/25/2013    Discharge Diagnoses:  Upper Airway Obstruction Bronchiectasis Sinus Tachycardia GERD Leukocytosis Anxiety                                                                      DISCHARGE PLAN BY DIAGNOSIS     Upper Airway Obstruction Bronchiectasis Upper Airway Drainage  Discharge Plan:  -continue Mucinex ER -encouraged adequate daily hydration  -follow up in pulmonary office in one week for hospital follow up and sputum culture review -continue zetonna -continue ProAir as needed -monitor off abx, prelim culture positive for GNR.  Sampling was very thick / cast like.  Likely colonization.  F/U in office for abx consideration.  -consider CBC to review WBC at time of follow up   GERD  Discharge Plan:  -maximum PPI & H2 Blocker -follow up with Dr. Ewing Schlein PRN  Resolved Issues Prior to Discharge Sinus Tachycardia Leukocytosis Anxiety                     DISCHARGE SUMMARY   Kathryn Griffin is a 57 y.o. y/o female, never smoker, with a PMH of GERD on PPI & H2 Blocker who presented on 11/26 with 2-3 day history of increasing fullness in her throat & green / brown sputum production.  Patient has been followed by Dr. Sherene Sires for pulmonary evaluation.  Has had a negative methacholine challenge and normal spirometry.  She was also evaluated by ENT (Dr. Annalee Genta) and felt her symptoms were related to GERD.  She sought a second pulmonary opinion in [place] who felt pulmonary symptoms were not related to asthma but rather GERD with chronic aspiration.   On admission, patient noted to have upper airway stridor.  ENT consulted for laryngoscopic exam with no paradoxical cord movement noted.  CT of the neck with no acute findings.  CT of Chest demonstrated near complete LUL consolidation and material in left mainstem bronchus as well.   Patient was electively intubated and fiberoptic bronchoscopy performed for removal of airway occlusion with thick cast like mucus.  Material sent for cultures and sensitivity.  At time of discharge, patient is afebrile and hemodynamically stable.  Pending follow up culture review in pulmonary office.             SIGNIFICANT DIAGNOSTIC STUDIES 11/26 - Laryngoscopic exam>>no paradaxical cord movement  11/26 - CT Neck>> No acute findings within the neck, recommend CT chest  11/26 - ENT consulted, feel this is below the vocal cords  11/27 - CT chest/trachea>>LUL consolidation / atx, material within L mainstem bronchus   MICRO DATA  BAL 11/28 >>>greater 100k GNR>>> Fungal / Yeast Culture 11/28>>>no yeast or fungal elements on prelim>>> AFB 11/28>>> MRSA PCR 11/26>>>negative   Discharge Exam: General: NAD Neuro: Intact, MAE HEENT: No JVD, NO LAN, mild stridor   Cardiovascular: RRR  Lungs: CTA  Abdomen: +bs  Musculoskeletal: intact  Skin: Warm/dry, no edema   Filed Vitals:   05/25/13 0600 05/25/13 0700 05/25/13 0750 05/25/13 0802  BP: 102/59  109/62   Pulse: 90 82 93   Temp:    98.4 F (36.9 C)  TempSrc:    Oral  Resp: 17 16 23    Height:      Weight:      SpO2: 95% 99% 98%    Discharge Labs  BMET  Recent Labs Lab 05/22/13 0719 05/22/13 0922 05/23/13 0416 05/24/13 0351 05/25/13 0610  NA 142 143 142 141 140  K 3.6 2.8* 4.7 4.0 4.1  CL 103 104 108 106 106  CO2  --  27 25 25 27   GLUCOSE 116* 249* 135* 139* 77  BUN 12 12 11 18 16   CREATININE 1.00 0.61 0.52 0.51 0.62  CALCIUM  --  9.0 9.5 8.9 8.8  MG  --  2.0  --  2.5 2.4  PHOS  --  1.9*  --  4.0 3.1   CBC  Recent Labs Lab 05/24/13 0351 05/24/13 1405 05/25/13 0610  HGB 12.7 14.2 13.8  HCT 38.0 42.3 40.9  WBC 13.6* 16.5* 13.6*  PLT 308 332 289   Anti-Coagulation  Recent Labs Lab 05/22/13 0922  INR 0.87    Discharge Orders   Future Orders Complete By Expires   Call MD for:  difficulty breathing,  headache or visual disturbances  As directed    Call MD for:  extreme fatigue  As directed    Call MD for:  persistant dizziness or light-headedness  As directed    Call MD for:  temperature >100.4  As directed    Diet general  As directed    Discharge instructions  As directed    Comments:     1. Call Monday to schedule hospital follow up with Dr. Sherene Sires - to be seen in 1 week. 2.  Drink at least three 8 oz glasses of water per day   Increase activity slowly  As directed          Follow-up Information   Follow up with Sandrea Hughs, MD. (Call on Monday to make an appt to be seen within one week for post hospital )    Specialty:  Pulmonary Disease   Contact information:   [address] [place] [address] [phone]        Medication List         CITRACAL PO  Take 1 tablet by mouth 2 (two) times daily.     dextromethorphan-guaiFENesin 30-600 MG per 12 hr tablet  Commonly known as:  MUCINEX DM  Take 1 tablet by mouth 2 (two) times daily.     esomeprazole 40 MG capsule  Commonly known as:  NEXIUM  Take 40 mg by mouth daily.     famotidine 20 MG tablet  Commonly known as:  PEPCID  Take 20 mg by mouth at bedtime.     multivitamin capsule  Take 1 capsule by mouth daily.     PROAIR HFA 108 (90 BASE) MCG/ACT inhaler  Generic drug:  albuterol  Inhale 2 puffs into the lungs every 6 (six) hours as needed.     ZETONNA 37 MCG/ACT Aers  Generic drug:  Ciclesonide  Place 1 spray into the nose every morning.       Disposition:  Discharge to Home.  No home health needs identified prior to discharge.   Discharged Condition: Kathryn Griffin has met maximum benefit of inpatient care and is medically stable and cleared for discharge.  Patient is pending follow up as above.      Time spent on disposition:  Greater than 35 minutes.   Signed: Canary Brim, NP-C [facility] Pgr: [phone] Office: [phone]

## 2013-05-25 NOTE — Progress Notes (Signed)
Patient seen and examined, agree with above note.  I dictated the care and orders written for this patient under my direction.  Jisel Fleet G Nychelle Cassata, MD [phone] 

## 2013-05-26 ENCOUNTER — Telehealth: Payer: Self-pay | Admitting: Pulmonary Disease

## 2013-05-26 LAB — CULTURE, BAL-QUANTITATIVE W GRAM STAIN: Colony Count: >=100000

## 2013-05-26 LAB — CULTURE, BAL-QUANTITATIVE

## 2013-05-26 MED ORDER — MOXIFLOXACIN HCL 400 MG PO TABS: 400.0000 mg | ORAL_TABLET | Freq: Every day | ORAL | Status: AC

## 2013-05-26 NOTE — Telephone Encounter (Signed)
Sputum Culture Results Reviewed:  Patient with pan sensitive E-Coli > 100k colonies.  Discussed results with patient and husband.     Plan: -Avelox 400 mg QD x 7 days called to [facility] on [address]  -encouraged to drink plenty of water (with meds and daily) -instructed to eat yogurt daily while on abx -patient indicates verbal understanding of instructions, to call if any new issues.     Canary Brim, NP-C [place] [facility] Pgr: [phone] or [phone]

## 2013-05-27 ENCOUNTER — Telehealth: Payer: Self-pay | Admitting: Internal Medicine

## 2013-05-27 LAB — ASPERGILLUS ANTIBODY (COMPLEMENT FIX): Aspergillus Antibody by Complement Fix: <1:8 {titer}

## 2013-05-27 NOTE — Telephone Encounter (Signed)
I called patient. She reports she needs a HFU in [place] with BQ this week per D/C instructions. She is scheduled to come in 05/31/13. Is this okay dr. Kendrick Fries? thanks

## 2013-05-27 NOTE — Telephone Encounter (Signed)
Pt is requesting to switch from Dr. Sherene Sires to Dr. Kendrick Fries, please advise. Carron Curie, CMA

## 2013-05-27 NOTE — Telephone Encounter (Signed)
Patient is calling back again.  She states she is needing to get in to see BQ for HFU.  Is it okay to switch?  [phone]

## 2013-05-27 NOTE — Telephone Encounter (Signed)
PLease advise Dr. Kendrick Fries thanks

## 2013-05-27 NOTE — Telephone Encounter (Signed)
Yes

## 2013-05-28 ENCOUNTER — Telehealth: Payer: Self-pay | Admitting: Pulmonary Disease

## 2013-05-28 NOTE — Telephone Encounter (Signed)
I spoke with the pt and advised her how to clean the flutter valve she received at the hospital. Pt also wanted to know if she is contagious. I advised her on proper hand washing technique and cough technique. Nothing further needed. Carron Curie, CMA

## 2013-05-28 NOTE — Telephone Encounter (Signed)
sure

## 2013-05-28 NOTE — Telephone Encounter (Signed)
Pt advised. Myndi Wamble, CMA  

## 2013-05-31 ENCOUNTER — Telehealth: Payer: Self-pay | Admitting: Pulmonary Disease

## 2013-05-31 ENCOUNTER — Ambulatory Visit (INDEPENDENT_AMBULATORY_CARE_PROVIDER_SITE_OTHER): Payer: BC Managed Care – PPO | Admitting: Pulmonary Disease

## 2013-05-31 ENCOUNTER — Encounter: Payer: Self-pay | Admitting: Pulmonary Disease

## 2013-05-31 VITALS — BP 108/72 | HR 70 | Temp 97.6°F | Ht 67.0 in | Wt 155.0 lb

## 2013-05-31 DIAGNOSIS — R05 Cough: Secondary | ICD-10-CM

## 2013-05-31 DIAGNOSIS — J9819 Other pulmonary collapse: Secondary | ICD-10-CM | POA: Insufficient documentation

## 2013-05-31 DIAGNOSIS — R059 Cough, unspecified: Secondary | ICD-10-CM

## 2013-05-31 MED ORDER — SODIUM CHLORIDE 3 % IN NEBU: INHALATION_SOLUTION | RESPIRATORY_TRACT | Status: DC | PRN

## 2013-05-31 MED ORDER — ALBUTEROL SULFATE (2.5 MG/3ML) 0.083% IN NEBU: 2.5000 mg | INHALATION_SOLUTION | Freq: Four times a day (QID) | RESPIRATORY_TRACT | Status: DC | PRN

## 2013-05-31 NOTE — Assessment & Plan Note (Signed)
She has had thick mucus plugging of the LUL seen on bronch last week which is consistent with her history of coughing up casts lately.    It is really not clear what his happening here.  I doubt ABPA given the low IGE, negative metacholine challenge, and no definitive bronchiectasis on chest imaging.  I wonder whether or not she has MAI given her intermittent response to antibiotics in the last year.  Plan: -treat e.coli with levaquin as written -f/u AFB cultures from bronch -add albuterol and hypertonic saline -continue flutter valve -repeat CT chest to ensure collapse improved, no residual mass -consider repeat bronch if AFB culture negative -if sick again, repeat CBC with diff and IgE prior to steroids

## 2013-05-31 NOTE — Telephone Encounter (Signed)
Twice a day as needed

## 2013-05-31 NOTE — Telephone Encounter (Signed)
LM with pharmacy making them aware.

## 2013-05-31 NOTE — Telephone Encounter (Signed)
Pharmacy is calling about the sig on the rx that was sent in today on sodium chloride nebulizer solution.  How often should this pt use this medication?  Please advise. Thanks  Allergies  Allergen Reactions  . Protonix [Pantoprazole Sodium]

## 2013-05-31 NOTE — Progress Notes (Signed)
Subjective:    Patient ID: Kathryn Griffin, female    DOB: 21-Jun-1956, 57 y.o.   MRN: [id number]  Synopsys: 57 yo female developed cough and thick mucus produciton for the first time in her life in 2013. Produced casts with coughing on several occassions and responded to antibiotic and prednisone therapy.  Bronchoscopy in 04/2013 showed mucus plugging/cast LUL. Baseline GERD.  Methacholine challenge 01/2013 negative.  IgE low/normal 2014. Eosinophil elevated one time in early 2014.  HPI  05/31/2013 Hospital F/U> Kathryn Griffin is here to see me becaues she was recently hospitalized.  She has been coughing up grey sputum for a year now.  She saw Dr. Sherene Sires recently.  In August she coughed up a mucus plug which was grey in color.  She saw Dr. Elnoria Howard in [place].  She has acid reflux which is going into her lungs.    She notes that she has had mucs in her lung sfor years.  She has been old that she had vocal cord dysfunction.   Normal childhood and normal adulthood.  Symptoms started last November when in [place] on vacation.  She started with a cold ad had lots of mucus production.  She coughed up a cast (yellow, green in color).  She was treated off and on with steroids and antiobiotics.  In [place] she had a really bad attack where she was experiencing dyspnea.  Rx prednisone and Levaquin and she got better.    Since her recent hospital discharge she feels great.  During that time she had a mucus plug removed during a bronchoscopy.  She has been taking Levaquin since then.  Past Medical History  Diagnosis Date  . Gestational diabetes   . Reflux      Review of Systems  Constitutional: Negative for chills, fatigue and unexpected weight change.  HENT: Negative for rhinorrhea, sinus pressure and sneezing.   Respiratory: Positive for cough. Negative for shortness of breath and wheezing.   Cardiovascular: Negative for chest pain, palpitations and leg swelling.       Objective:   Physical Exam Filed  Vitals:   05/31/13 1200  BP: 108/72  Pulse: 70  Temp: 97.6 F (36.4 C)  TempSrc: Oral  Height: 5\' 7"  (1.702 m)  Weight: 155 lb (70.308 kg)  SpO2: 100%    Gen: well appearing HEENT: NCAT, PERRL, EOMi PULM: CTA B CV: RRR, no mgr AB: BS+, soft, nontender Ext: warm, no edema  04/2013 CT chest reviewed> LUL collapse, ? Bronchiectasis RML, scattered nodules     Assessment & Plan:   Cough I think that this is primarily reflux related, but clearly the thick mucus plug we found on bronch recently is concerning, see discussion below.  Plan: -continue reflux therapy   Lung collapse She has had thick mucus plugging of the LUL seen on bronch last week which is consistent with her history of coughing up casts lately.    It is really not clear what his happening here.  I doubt ABPA given the low IGE, negative metacholine challenge, and no definitive bronchiectasis on chest imaging.  I wonder whether or not she has MAI given her intermittent response to antibiotics in the last year.  Plan: -treat e.coli with levaquin as written -f/u AFB cultures from bronch -add albuterol and hypertonic saline -continue flutter valve -repeat CT chest to ensure collapse improved, no residual mass -consider repeat bronch if AFB culture negative -if sick again, repeat CBC with diff and IgE prior to steroids  Updated Medication List Outpatient Encounter Prescriptions as of 05/31/2013  Medication Sig  . albuterol (PROAIR HFA) 108 (90 BASE) MCG/ACT inhaler Inhale 2 puffs into the lungs every 6 (six) hours as needed.  . Calcium Citrate (CITRACAL PO) Take 1 tablet by mouth 2 (two) times daily.  . Ciclesonide (ZETONNA) 37 MCG/ACT AERS Place 1 spray into the nose every morning.  Marland Kitchen dextromethorphan-guaiFENesin (MUCINEX DM) 30-600 MG per 12 hr tablet Take 1 tablet by mouth 2 (two) times daily.  Marland Kitchen esomeprazole (NEXIUM) 40 MG capsule Take 40 mg by mouth daily.  . famotidine (PEPCID) 20 MG tablet Take 20 mg by  mouth at bedtime.  . moxifloxacin (AVELOX) 400 MG tablet Take 1 tablet (400 mg total) by mouth daily.  . Multiple Vitamin (MULTIVITAMIN) capsule Take 1 capsule by mouth daily.    Marland Kitchen albuterol (PROVENTIL) (2.5 MG/3ML) 0.083% nebulizer solution Take 3 mLs (2.5 mg total) by nebulization every 6 (six) hours as needed for wheezing or shortness of breath.  . sodium chloride HYPERTONIC 3 % nebulizer solution Take by nebulization as needed for other.

## 2013-05-31 NOTE — Patient Instructions (Signed)
Use the albuterol twice a day If your mucus production increases, use hypertonic saline twice a day We will repeat a CT chest in one week We will see you back in 5 weeks or sooner if needed

## 2013-05-31 NOTE — Assessment & Plan Note (Signed)
I think that this is primarily reflux related, but clearly the thick mucus plug we found on bronch recently is concerning, see discussion below.  Plan: -continue reflux therapy

## 2013-06-05 ENCOUNTER — Telehealth: Payer: Self-pay | Admitting: Pulmonary Disease

## 2013-06-05 NOTE — Telephone Encounter (Signed)
She needs to see her primary care physician to evaluate this

## 2013-06-05 NOTE — Telephone Encounter (Signed)
Spoke with patient- states that BQ seen her hand last week-edema due to IV placed while in hospital; told to use warm compresses and if not getting better let him know. Edema not getting better and vein looking larger than it should be. Please advise.

## 2013-06-05 NOTE — Telephone Encounter (Signed)
Spoke with pt-she will call her PCP in the AM to be seen.

## 2013-06-07 ENCOUNTER — Ambulatory Visit (INDEPENDENT_AMBULATORY_CARE_PROVIDER_SITE_OTHER)
Admission: RE | Admit: 2013-06-07 | Discharge: 2013-06-07 | Disposition: A | Discharge status: Home / Self Care | Payer: BC Managed Care – PPO | Source: Ambulatory Visit | Attending: Pulmonary Disease | Admitting: Pulmonary Disease

## 2013-06-07 DIAGNOSIS — J9819 Other pulmonary collapse: Secondary | ICD-10-CM

## 2013-06-07 DIAGNOSIS — R05 Cough: Secondary | ICD-10-CM

## 2013-06-07 DIAGNOSIS — R059 Cough, unspecified: Secondary | ICD-10-CM

## 2013-06-10 ENCOUNTER — Other Ambulatory Visit: Payer: Self-pay

## 2013-06-10 ENCOUNTER — Telehealth: Payer: Self-pay | Admitting: Pulmonary Disease

## 2013-06-10 DIAGNOSIS — J9600 Acute respiratory failure, unspecified whether with hypoxia or hypercapnia: Secondary | ICD-10-CM

## 2013-06-10 DIAGNOSIS — Z5181 Encounter for therapeutic drug level monitoring: Secondary | ICD-10-CM

## 2013-06-10 MED ORDER — ITRACONAZOLE 100 MG PO CAPS: 200.0000 mg | ORAL_CAPSULE | Freq: Every day | ORAL | Status: DC

## 2013-06-10 NOTE — Telephone Encounter (Signed)
I called Kathryn Griffin today to discuss the results of her bronchoscopy which showed aspergillus (as well as the sputum culture).  I want to treat her for one month with itraconazole.  Unfortunately we were cut off.  If she calls back, please let her know I am going to call in Itraconazole 200mg  daily to take for one month.  If she has questions, please have her give a date and time and number when we can talk about it more.  We will check her liver function tests after being on it for a week to make sure she is doing well with it (it is very unlikely it will cause problems, but we will check it to be sure).

## 2013-06-11 ENCOUNTER — Telehealth: Payer: Self-pay | Admitting: Pulmonary Disease

## 2013-06-11 NOTE — Telephone Encounter (Signed)
Pt returned call & will back later.  Kathryn Griffin

## 2013-06-11 NOTE — Telephone Encounter (Signed)
Dr. Kendrick Fries he pt is wanting to know what medications is she to continue? Should she continue the albuterol neb, hypertonic saline neb, flutter valve, and zetonna? Please advise. Carron Curie, CMA

## 2013-06-11 NOTE — Telephone Encounter (Signed)
lmomtcb x 2  

## 2013-06-11 NOTE — Telephone Encounter (Signed)
Pt returned call.  Kathryn Griffin ° °

## 2013-06-11 NOTE — Telephone Encounter (Signed)
lmtcb x1 

## 2013-06-11 NOTE — Telephone Encounter (Signed)
Per last OV note: Plan:  -treat e.coli with levaquin as written  -f/u AFB cultures from bronch  -add albuterol and hypertonic saline  -continue flutter valve  -repeat CT chest to ensure collapse improved, no residual mass  -consider repeat bronch if AFB culture negative  -if sick again, repeat CBC with diff and IgE prior to steroids  Per pt request I left this detailed on her voicemail that she needs to continue these meds. Carron Curie, CMA

## 2013-06-11 NOTE — Telephone Encounter (Signed)
Pt called back stating she is getting ready to leave for class & will not be available for most of the rest of the day.  Pt asks to have a detailed message given upon a returned call to her.    Pt would like to clarify which medications she should currently be taking:  Albuterol nebulizer solution, sodium chloride, mucinex DM & "rattler" given to her in hospital.  Pt believes it is ok to discontinue these meds.  Pt also believes BQ told her to continue w/ the ciclesonide.  Please confirm this w/ BQ & leave a detailed message for pt when returning her phone call as she will not be available most of the afternoon. Kathryn Griffin

## 2013-06-12 NOTE — Telephone Encounter (Signed)
Zetonna yes (I told her this already twice, including on the phone two days ago) All else only if she needs them (for increased mucus, chest congestion) Remind her to get her LFT's for the itraconazole and to not drink alcohol with it

## 2013-06-12 NOTE — Telephone Encounter (Signed)
lmomtcb x1 

## 2013-06-13 NOTE — Telephone Encounter (Signed)
Called and spoke with pt and she is aware of BQ recs for the meds.  She is aware that she will need to have her LFT's checked next week.  Nothing further is needed.

## 2013-06-13 NOTE — Telephone Encounter (Signed)
Pt returning call

## 2013-06-13 NOTE — Telephone Encounter (Signed)
LMTCBx2. Landan Fedie, CMA  

## 2013-06-13 NOTE — Telephone Encounter (Signed)
lmomtcb x1 

## 2013-06-13 NOTE — Telephone Encounter (Signed)
Returning call.Kathryn Griffin ° °

## 2013-06-18 ENCOUNTER — Other Ambulatory Visit (INDEPENDENT_AMBULATORY_CARE_PROVIDER_SITE_OTHER): Payer: BC Managed Care – PPO

## 2013-06-18 DIAGNOSIS — J9600 Acute respiratory failure, unspecified whether with hypoxia or hypercapnia: Secondary | ICD-10-CM

## 2013-06-18 LAB — HEPATIC FUNCTION PANEL
ALT: 30 U/L (ref 0–35)
AST: 25 U/L (ref 0–37)
Albumin: 4.1 g/dL (ref 3.5–5.2)
Alkaline Phosphatase: 58 U/L (ref 39–117)
Bilirubin, Direct: 0.1 mg/dL (ref 0.0–0.3)
Total Bilirubin: 0.5 mg/dL (ref 0.3–1.2)
Total Protein: 6.8 g/dL (ref 6.0–8.3)

## 2013-06-22 LAB — FUNGUS CULTURE W SMEAR
Fungal Smear: NONE SEEN
Fungal Smear: NONE SEEN

## 2013-06-24 ENCOUNTER — Telehealth: Payer: Self-pay | Admitting: Pulmonary Disease

## 2013-06-24 DIAGNOSIS — J9600 Acute respiratory failure, unspecified whether with hypoxia or hypercapnia: Secondary | ICD-10-CM

## 2013-06-24 MED ORDER — ITRACONAZOLE 100 MG PO CAPS: 200.0000 mg | ORAL_CAPSULE | Freq: Every day | ORAL | Status: DC

## 2013-06-24 NOTE — Telephone Encounter (Signed)
After reviewing last office notes and ct results, I called in another 30 pills of itraconazole.  Pt aware.  Nothing further needed at this time. Kathryn Griffin L

## 2013-06-25 ENCOUNTER — Telehealth: Payer: Self-pay

## 2013-06-25 NOTE — Telephone Encounter (Signed)
Message copied by Velvet Bathe on Tue Jun 25, 2013  2:12 PM ------      Message from: Max Fickle B      Created: Mon Jun 24, 2013  5:39 PM       A,            Please let her know that her LFTs are normal            Thanks      B ------

## 2013-06-25 NOTE — Telephone Encounter (Signed)
lmtcb X1 

## 2013-06-26 NOTE — Telephone Encounter (Signed)
Pt advised. Jennifer Castillo, CMA  

## 2013-06-26 NOTE — Telephone Encounter (Signed)
Patient returning call.  [phone]

## 2013-07-01 ENCOUNTER — Telehealth: Payer: Self-pay | Admitting: Pulmonary Disease

## 2013-07-01 NOTE — Telephone Encounter (Signed)
I spoke with pt. I advised her to keep scheduled appt as this will be her 5 week follow up appt. Nothing further needed

## 2013-07-03 ENCOUNTER — Ambulatory Visit: Payer: BC Managed Care – PPO | Admitting: Pulmonary Disease

## 2013-07-04 ENCOUNTER — Telehealth: Payer: Self-pay | Admitting: *Deleted

## 2013-07-04 MED ORDER — ESTRADIOL 10 MCG VA TABS: 1.0000 | ORAL_TABLET | VAGINAL | Status: DC

## 2013-07-04 NOTE — Telephone Encounter (Signed)
Check with her insurance company to see if Vagifem 10 mcg to apply intravaginally twice a week is covered. If so please prescribe one-month supply with 11 refills.

## 2013-07-04 NOTE — Telephone Encounter (Signed)
Left the below on pt voicemail, rx sent 

## 2013-07-04 NOTE — Telephone Encounter (Signed)
Pt was prescribed estradiol 0.02% on 02/12/13 OV pt said she said medication is no longer covered. Pt asked if something else could be given? Please advise

## 2013-07-08 LAB — AFB CULTURE WITH SMEAR (NOT AT ARMC): Acid Fast Smear: NONE SEEN

## 2013-07-18 ENCOUNTER — Ambulatory Visit (INDEPENDENT_AMBULATORY_CARE_PROVIDER_SITE_OTHER): Payer: BC Managed Care – PPO | Admitting: Pulmonary Disease

## 2013-07-18 ENCOUNTER — Encounter: Payer: Self-pay | Admitting: Pulmonary Disease

## 2013-07-18 ENCOUNTER — Other Ambulatory Visit (INDEPENDENT_AMBULATORY_CARE_PROVIDER_SITE_OTHER): Payer: BC Managed Care – PPO

## 2013-07-18 VITALS — BP 122/66 | HR 66 | Temp 97.9°F | Ht 67.0 in | Wt 156.0 lb

## 2013-07-18 DIAGNOSIS — B449 Aspergillosis, unspecified: Secondary | ICD-10-CM

## 2013-07-18 DIAGNOSIS — B4489 Other forms of aspergillosis: Secondary | ICD-10-CM

## 2013-07-18 DIAGNOSIS — J9600 Acute respiratory failure, unspecified whether with hypoxia or hypercapnia: Secondary | ICD-10-CM

## 2013-07-18 DIAGNOSIS — R05 Cough: Secondary | ICD-10-CM

## 2013-07-18 DIAGNOSIS — R059 Cough, unspecified: Secondary | ICD-10-CM

## 2013-07-18 LAB — HEPATIC FUNCTION PANEL
ALT: 42 U/L — ABNORMAL HIGH (ref 0–35)
AST: 27 U/L (ref 0–37)
Albumin: 4.1 g/dL (ref 3.5–5.2)
Alkaline Phosphatase: 61 U/L (ref 39–117)
Bilirubin, Direct: 0 mg/dL (ref 0.0–0.3)
Total Bilirubin: 0.7 mg/dL (ref 0.3–1.2)
Total Protein: 7.3 g/dL (ref 6.0–8.3)

## 2013-07-18 MED ORDER — ITRACONAZOLE 100 MG PO CAPS: 200.0000 mg | ORAL_CAPSULE | Freq: Every day | ORAL | Status: DC

## 2013-07-18 NOTE — Patient Instructions (Signed)
Take the itraconazole for another month Follow the GERD lifestyle sheet We will see you back in three months or sooner if needed

## 2013-07-18 NOTE — Assessment & Plan Note (Signed)
At this point her cough is due to GERD as I think the aspergillus issue is nearly gone.    Plan: -itraconazole for another month -GERD lifesyle therapy encouraged and reviewed

## 2013-07-18 NOTE — Progress Notes (Signed)
Subjective:    Patient ID: Kathryn Griffin, female    DOB: 01/02/1956, 58 y.o.   MRN: [id number]  Synopsys: 58 yo female developed cough and thick mucus produciton for the first time in her life in 2013. Produced casts with coughing on several occassions and responded to antibiotic and prednisone therapy.  Bronchoscopy in 04/2013 showed mucus plugging/cast LUL. Bronch cultures grew aspergillus species. Baseline GERD.  Methacholine challenge 01/2013 negative.  IgE low/normal 2014. Eosinophil elevated one time in early 2014.  She was treated with itraconazole for a month and did well by January 2015.  HPI   05/31/2013 Hospital F/U> Kathryn Griffin is here to see me becaues she was recently hospitalized.  She has been coughing up grey sputum for a year now.  She saw Dr. Melvyn Novas recently.  In August she coughed up a mucus plug which was grey in color.  She saw Dr. Benson Norway in [place].  She has acid reflux which is going into her lungs.    She notes that she has had mucs in her lung sfor years.  She has been old that she had vocal cord dysfunction.   Normal childhood and normal adulthood.  Symptoms started last November when in [place] on vacation.  She started with a cold ad had lots of mucus production.  She coughed up a cast (yellow, green in color).  She was treated off and on with steroids and antiobiotics.  In [place] she had a really bad attack where she was experiencing dyspnea.  Rx prednisone and Levaquin and she got better.    Since her recent hospital discharge she feels great.  During that time she had a mucus plug removed during a bronchoscopy.  She has been taking Levaquin since then.  07/18/2013 TROV >> In the last few days Kathryn Griffin has been having an increased cough and mucus production.  She fels a rattling and noise in her chest.  Mucinex made it better.  No sputum production.  Rare sputum production.   She didn't like she that she couldn't drink a glass of wine while on the itraconazole, but for the  most part she has done great on it.  Minimal dyspnea, no albuterol or ciclesonide use.  She is having some acid reflux.  Past Medical History  Diagnosis Date  . Gestational diabetes   . Reflux      Review of Systems  Constitutional: Negative for chills, fatigue and unexpected weight change.  HENT: Negative for rhinorrhea, sinus pressure and sneezing.   Respiratory: Positive for cough. Negative for shortness of breath and wheezing.   Cardiovascular: Negative for chest pain, palpitations and leg swelling.       Objective:   Physical Exam  Filed Vitals:   07/18/13 1339  BP: 122/66  Pulse: 66  Temp: 97.9 F (36.6 C)  TempSrc: Oral  Height: 5\' 7"  (1.702 m)  Weight: 156 lb (70.761 kg)  SpO2: 98%  RA  Gen: well appearing HEENT: NCAT, PERRL, EOMi PULM: wheeze RUL, otherwise clear CV: RRR, no mgr AB: BS+, soft, nontender Ext: warm, no edema  04/2013 CT chest reviewed> LUL collapse, ? 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 She really doesn't have asthma or ABPA by strict diagnostic criteria, but she clearly felt worse with a lot of green mucus production and had a lot of evidence of airway-only aspergillus infection.  Further, she has done really well with itraconazole therapy.  Plan: -continue itraconazole for another month -check LFTs -f/u three months  Cough At this point her cough is due to GERD as I think the aspergillus issue is nearly gone.    Plan: -itraconazole for another month -GERD lifesyle therapy encouraged and reviewed    Updated  Medication List Outpatient Encounter Prescriptions as of 07/18/2013  Medication Sig  . albuterol (PROAIR HFA) 108 (90 BASE) MCG/ACT inhaler Inhale 2 puffs into the lungs every 6 (six) hours as needed.  Marland Kitchen albuterol (PROVENTIL) (2.5 MG/3ML) 0.083% nebulizer solution Take 3 mLs (2.5 mg total) by nebulization every 6 (six) hours as needed for wheezing or shortness of breath.  . Calcium Citrate (CITRACAL PO) Take 1 tablet by mouth 2 (two) times daily.  . Ciclesonide (ZETONNA) 37 MCG/ACT AERS Place 1 spray into the nose every morning.  Marland Kitchen dextromethorphan-guaiFENesin (MUCINEX DM) 30-600 MG per 12 hr tablet Take 1 tablet by mouth 2 (two) times daily.  Marland Kitchen esomeprazole (NEXIUM) 40 MG capsule Take 40 mg by mouth daily.  . famotidine (PEPCID) 20 MG tablet Take 20 mg by mouth at bedtime.  . Multiple Vitamin (MULTIVITAMIN) capsule Take 1 capsule by mouth daily.    . sodium chloride HYPERTONIC 3 % nebulizer solution Take by nebulization as needed for other.  . Estradiol 10 MCG TABS vaginal tablet Place 1 tablet (10 mcg total) vaginally 2 (two) times a week.  . [DISCONTINUED] itraconazole (SPORANOX) 100 MG capsule Take 2 capsules (200 mg total) by mouth daily.

## 2013-07-18 NOTE — Assessment & Plan Note (Addendum)
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 She really doesn't have asthma or ABPA by strict diagnostic criteria, but she clearly felt worse with a lot of green mucus production and had a lot of evidence of airway-only aspergillus infection.  Further, she has done really well with itraconazole therapy.  Plan: -continue itraconazole for another month -check LFTs -f/u three months

## 2013-07-19 ENCOUNTER — Telehealth: Payer: Self-pay

## 2013-07-19 DIAGNOSIS — J9600 Acute respiratory failure, unspecified whether with hypoxia or hypercapnia: Secondary | ICD-10-CM

## 2013-07-19 NOTE — Telephone Encounter (Signed)
PA has been faxed.

## 2013-07-19 NOTE — Telephone Encounter (Signed)
Spoke with pt, she is aware of labs and BQ's recs.  She states that her pharmacy said that she needed a PA for the itraconazole.  I do not have paperwork for this PA but I will call the pharmacy and get that done today.

## 2013-07-19 NOTE — Telephone Encounter (Signed)
Message copied by Len Blalock on Fri Jul 19, 2013  9:46 AM ------      Message from: Simonne Maffucci B      Created: Fri Jul 19, 2013  7:53 AM      Regarding: FW:       Can you let her know that her liver tests were fine with the exception that one lab was just barely elevated.  This is not concerning.  She is very anxious and will ask a lot about this.  Tell her that I say it really is safe to take the itraconazole.  We will need to repeat LFT's in a week, please order this. Thanks      ----- Message -----         From: [facility]         Sent: 07/18/2013   3:27 PM           To: Juanito Doom, MD       ------

## 2013-07-22 ENCOUNTER — Telehealth: Payer: Self-pay | Admitting: Pulmonary Disease

## 2013-07-22 NOTE — Telephone Encounter (Signed)
Error.Kathryn Griffin ° °

## 2013-07-22 NOTE — Telephone Encounter (Signed)
Pt says that the pharmacy didn't receive PA for her itraconazole please advise.//[facility] [employer].Elnita Maxwell

## 2013-07-22 NOTE — Telephone Encounter (Signed)
LMTCB

## 2013-07-22 NOTE — Telephone Encounter (Signed)
Spoke with pharmacist, she said that the PA had gone through and she would fill the rx and let the pt know.   I will put the order in for her LFT and have her come in 1 week from her first dose.  Nothing further needed.

## 2013-07-30 ENCOUNTER — Ambulatory Visit (INDEPENDENT_AMBULATORY_CARE_PROVIDER_SITE_OTHER)
Admission: RE | Admit: 2013-07-30 | Discharge: 2013-07-30 | Disposition: A | Discharge status: Home / Self Care | Payer: BC Managed Care – PPO | Source: Ambulatory Visit | Attending: Adult Health | Admitting: Adult Health

## 2013-07-30 ENCOUNTER — Encounter: Payer: Self-pay | Admitting: Adult Health

## 2013-07-30 ENCOUNTER — Ambulatory Visit (INDEPENDENT_AMBULATORY_CARE_PROVIDER_SITE_OTHER): Payer: BC Managed Care – PPO | Admitting: Adult Health

## 2013-07-30 ENCOUNTER — Telehealth: Payer: Self-pay | Admitting: Pulmonary Disease

## 2013-07-30 VITALS — BP 132/74 | HR 116 | Temp 101.7°F | Ht 67.0 in | Wt 152.4 lb

## 2013-07-30 DIAGNOSIS — B449 Aspergillosis, unspecified: Secondary | ICD-10-CM

## 2013-07-30 DIAGNOSIS — B4489 Other forms of aspergillosis: Secondary | ICD-10-CM

## 2013-07-30 DIAGNOSIS — J209 Acute bronchitis, unspecified: Secondary | ICD-10-CM | POA: Insufficient documentation

## 2013-07-30 DIAGNOSIS — R6889 Other general symptoms and signs: Secondary | ICD-10-CM

## 2013-07-30 MED ORDER — PREDNISONE 10 MG PO TABS: ORAL_TABLET | ORAL | Status: DC

## 2013-07-30 MED ORDER — LEVALBUTEROL HCL 0.63 MG/3ML IN NEBU
0.6300 mg | INHALATION_SOLUTION | Freq: Once | RESPIRATORY_TRACT | Status: AC
  Administered 2013-07-30: 0.63 mg via RESPIRATORY_TRACT

## 2013-07-30 MED ORDER — LEVOFLOXACIN 500 MG PO TABS: 500.0000 mg | ORAL_TABLET | Freq: Every day | ORAL | Status: AC

## 2013-07-30 NOTE — Assessment & Plan Note (Signed)
Acute Bronchitis in setting of known pulmonary aspergillus on intraconazole  Neg flu swab in office  cxr w/ no acute process noted In past pt had difficulty with mucus plugging, today she has no stridor  Advised to continue mucolytics. She improved greatly after albuterol neb, may continue to use this at home  Add empiric abx and steroids  Close OP follow up  Discussed case with Dr. Elsworth Soho    Plan  Begin Levaquin 500mg  daily 7 days  Mucinex DM Twice daily  As needed  Cough/congestion  Fluids and rest  Tylenol As needed  For fever.  Prednisone taper over next week.  Follow up with Dr. Lake Bells in 3 days and As needed   Use Albuterol Neb every 4-6 hrs As needed  Wheezing /shortness of breath  Please contact office for sooner follow up if symptoms do not improve or worsen or seek emergency care

## 2013-07-30 NOTE — Patient Instructions (Signed)
Begin Levaquin 500mg  daily 7 days  Mucinex DM Twice daily  As needed  Cough/congestion  Fluids and rest  Tylenol As needed  For fever.  Prednisone taper over next week.  Follow up with Dr. Lake Bells in 3 days and As needed   Use Albuterol Neb every 4-6 hrs As needed  Wheezing /shortness of breath  Please contact office for sooner follow up if symptoms do not improve or worsen or seek emergency care

## 2013-07-30 NOTE — Telephone Encounter (Signed)
Left message with pt, per her request. Advised her that if she felt she had bronchitis that she should be seen. Whether it be with [place] or her PCP. Asked her to call back if she would like to see [place] about this issue.

## 2013-07-30 NOTE — Progress Notes (Signed)
Subjective:    Patient ID: Kathryn Griffin, female    DOB: [birth year], 58 y.o.   MRN: [id number]  HPI  Synopsys: 58 yo female developed cough and thick mucus produciton for the first time in her life in 2013. Produced casts with coughing on several occassions and responded to antibiotic and prednisone therapy.  Bronchoscopy in 04/2013 showed mucus plugging/cast LUL. Bronch cultures grew aspergillus species. Baseline GERD.  Methacholine challenge 01/2013 negative.  IgE low/normal 2014. Eosinophil elevated one time in early 2014.  She was treated with itraconazole for a month and did well by January 2015.  HPI 05/31/2013 Hospital F/U> Kathryn Griffin is here to see me becaues she was recently hospitalized.  She has been coughing up grey sputum for a year now.  She saw Dr. Melvyn Novas recently.  In August she coughed up a mucus plug which was grey in color.  She saw Dr. Benson Norway in [place].  She has acid reflux which is going into her lungs.    She notes that she has had mucs in her lung sfor years.  She has been old that she had vocal cord dysfunction.   Normal childhood and normal adulthood.  Symptoms started last November when in [place] on vacation.  She started with a cold ad had lots of mucus production.  She coughed up a cast (yellow, green in color).  She was treated off and on with steroids and antiobiotics.  In [place] she had a really bad attack where she was experiencing dyspnea.  Rx prednisone and Levaquin and she got better.    Since her recent hospital discharge she feels great.  During that time she had a mucus plug removed during a bronchoscopy.  She has been taking Levaquin since then.  07/18/2013 TROV >> In the last few days Kathryn Griffin has been having an increased cough and mucus production.  She fels a rattling and noise in her chest.  Mucinex made it better.  No sputum production.  Rare sputum production.   She didn't like she that she couldn't drink a glass of wine while on the itraconazole, but for the  most part she has done great on it.  Minimal dyspnea, no albuterol or ciclesonide use.  She is having some acid reflux. >cont on itraconazole   07/30/2013 Acute OV  Presents for an acute office visit. Complains of wheezing, increased SOB, prod cough with brown mucus, fever today. Has felt like she was catching a cold for last several days. Went to work today, felt worse as the day progressed. This evening coughing up really thick mucus, fever this evening.  Denies hemoptysis, body aches, vomiting.  Coughing up thick brown clumps of mucus.  Appetite is good, no n/v/d.      Review of Systems Constitutional:   No  weight loss, night sweats,  Fevers, chills, fatigue, or  lassitude.  HEENT:   No headaches,  Difficulty swallowing,  Tooth/dental problems, or  Sore throat,                No sneezing, itching, ear ache,  +nasal congestion, post nasal drip,   CV:  No chest pain,  Orthopnea, PND, swelling in lower extremities, anasarca, dizziness, palpitations, syncope.   GI  No heartburn, indigestion, abdominal pain, nausea, vomiting, diarrhea, change in bowel habits, loss of appetite, bloody stools.   Resp:    No chest wall deformity  Skin: no rash or lesions.  GU: no dysuria, change in color of urine, no urgency or  frequency.  No flank pain, no hematuria   MS:  No joint pain or swelling.  No decreased range of motion.  No back pain.  Psych:  No change in mood or affect. No depression or anxiety.  No memory loss.         Objective:   Physical Exam GEN: A/Ox3; pleasant , NAD  HEENT:  [place]/AT,  EACs-clear, TMs-wnl, NOSE-clear, THROAT-clear, no lesions, no postnasal drip or exudate noted.   NECK:  Supple w/ fair ROM; no JVD; normal carotid impulses w/o bruits; no thyromegaly or nodules palpated; no lymphadenopathy.  RESP  Coarse rhonchi , trace wheezing. No stridor. Talking in full sentences, no accessory muscle use, no dullness to percussion  CARD:  RRR, no m/r/g  , no peripheral  edema, pulses intact, no cyanosis or clubbing.  GI:   Soft & nt; nml bowel sounds; no organomegaly or masses detected.  Musco: Warm bil, no deformities or joint swelling noted.   Neuro: alert, no focal deficits noted.    Skin: Warm, no lesions or rashes  CXR 07/30/2013 There is no edema or consolidation. Heart size and pulmonary vascularity are normal. No adenopathy. No bone lesions.   O2 sats post albuterol tx -94% on RA.  Improved aeration post treatment .   Flu swab in office neg    Assessment & Plan:

## 2013-07-31 NOTE — Progress Notes (Signed)
I agree with the assessment and plan of Tammy Parrett NP above

## 2013-08-02 ENCOUNTER — Encounter: Payer: Self-pay | Admitting: Adult Health

## 2013-08-02 ENCOUNTER — Other Ambulatory Visit (INDEPENDENT_AMBULATORY_CARE_PROVIDER_SITE_OTHER): Payer: BC Managed Care – PPO

## 2013-08-02 ENCOUNTER — Telehealth: Payer: Self-pay | Admitting: Pulmonary Disease

## 2013-08-02 ENCOUNTER — Ambulatory Visit (INDEPENDENT_AMBULATORY_CARE_PROVIDER_SITE_OTHER): Payer: BC Managed Care – PPO | Admitting: Adult Health

## 2013-08-02 ENCOUNTER — Other Ambulatory Visit: Payer: Self-pay

## 2013-08-02 VITALS — BP 122/68 | HR 64 | Temp 98.4°F | Ht 67.5 in | Wt 154.2 lb

## 2013-08-02 DIAGNOSIS — B449 Aspergillosis, unspecified: Secondary | ICD-10-CM

## 2013-08-02 DIAGNOSIS — J209 Acute bronchitis, unspecified: Secondary | ICD-10-CM

## 2013-08-02 DIAGNOSIS — B4489 Other forms of aspergillosis: Secondary | ICD-10-CM

## 2013-08-02 DIAGNOSIS — J9600 Acute respiratory failure, unspecified whether with hypoxia or hypercapnia: Secondary | ICD-10-CM

## 2013-08-02 MED ORDER — LEVALBUTEROL HCL 0.63 MG/3ML IN NEBU
0.6300 mg | INHALATION_SOLUTION | Freq: Once | RESPIRATORY_TRACT | Status: AC
  Administered 2013-08-02: 0.63 mg via RESPIRATORY_TRACT

## 2013-08-02 MED ORDER — ITRACONAZOLE 100 MG PO CAPS: 100.0000 mg | ORAL_CAPSULE | Freq: Two times a day (BID) | ORAL | Status: DC

## 2013-08-02 MED ORDER — LEVOFLOXACIN 500 MG PO TABS: 500.0000 mg | ORAL_TABLET | Freq: Every day | ORAL | Status: AC

## 2013-08-02 NOTE — Assessment & Plan Note (Signed)
Continue on current regimen  follow up Dr. Lake Bells in 2 weeks and As needed

## 2013-08-02 NOTE — Progress Notes (Signed)
Subjective:    Patient ID: Kathryn Griffin, female    DOB: 1955-11-06, 58 y.o.   MRN: [id number]  HPI   Synopsys: 58 yo female developed cough and thick mucus produciton for the first time in her life in 2013. Produced casts with coughing on several occassions and responded to antibiotic and prednisone therapy.  Bronchoscopy in 04/2013 showed mucus plugging/cast LUL. Bronch cultures grew aspergillus species. Baseline GERD.  Methacholine challenge 01/2013 negative.  IgE low/normal 2014. Eosinophil elevated one time in early 2014.  She was treated with itraconazole for a month and did well by January 2015.  HPI 05/31/2013 Hospital F/U> Kathryn Griffin is here to see me becaues she was recently hospitalized.  She has been coughing up grey sputum for a year now.  She saw Dr. Melvyn Novas recently.  In August she coughed up a mucus plug which was grey in color.  She saw Dr. Benson Norway in [place].  She has acid reflux which is going into her lungs.    She notes that she has had mucs in her lung sfor years.  She has been old that she had vocal cord dysfunction.   Normal childhood and normal adulthood.  Symptoms started last November when in [place] on vacation.  She started with a cold ad had lots of mucus production.  She coughed up a cast (yellow, green in color).  She was treated off and on with steroids and antiobiotics.  In [place] she had a really bad attack where she was experiencing dyspnea.  Rx prednisone and Levaquin and she got better.    Since her recent hospital discharge she feels great.  During that time she had a mucus plug removed during a bronchoscopy.  She has been taking Levaquin since then.  07/18/2013 TROV >> In the last few days Kathryn Griffin has been having an increased cough and mucus production.  She fels a rattling and noise in her chest.  Mucinex made it better.  No sputum production.  Rare sputum production.   She didn't like she that she couldn't drink a glass of wine while on the itraconazole, but for  the most part she has done great on it.  Minimal dyspnea, no albuterol or ciclesonide use.  She is having some acid reflux. >cont on itraconazole   07/30/13 Acute OV  Presents for an acute office visit. Complains of wheezing, increased SOB, prod cough with brown mucus, fever today. Has felt like she was catching a cold for last several days. Went to work today, felt worse as the day progressed. This evening coughing up really thick mucus, fever this evening.  Denies hemoptysis, body aches, vomiting.  Coughing up thick brown clumps of mucus.  Appetite is good, no n/v/d.  >>levaquin and steroid taper  Flu swab neg in office    08/02/2013 Follow up  atient returns for three-day followup. Patient was seen last visit with a severe bronchitis. Patient has underlying known pulmonary Aspergillus on intraconazole .  Chest x-ray showed no acute findings. She was treated with Levaquin 500 mg daily x7 days along with prednisone taper. Patient had difficulties with mucus plugging during her hospitalization in November. She was started on mucolytics, and recommend he use her albuterol nebulizer along with flutter valve. Patient reports that she is feeling much improved. She feels that she is 70% better. Her cough and congestion to decrease. She is now coughing up mainly yellow. Mucus. This is also decreased in thickness, and intensity. Patient denies any hemoptysis, orthopnea, PND,  leg swelling. Her fevers have resolved.   Review of Systems  Constitutional:   No  weight loss, night sweats,  Fevers, chills, fatigue, or  lassitude.  HEENT:   No headaches,  Difficulty swallowing,  Tooth/dental problems, or  Sore throat,                No sneezing, itching, ear ache,  +nasal congestion, post nasal drip,   CV:  No chest pain,  Orthopnea, PND, swelling in lower extremities, anasarca, dizziness, palpitations, syncope.   GI  No heartburn, indigestion, abdominal pain, nausea, vomiting, diarrhea, change in bowel  habits, loss of appetite, bloody stools.   Resp:    No chest wall deformity  Skin: no rash or lesions.  GU: no dysuria, change in color of urine, no urgency or frequency.  No flank pain, no hematuria   MS:  No joint pain or swelling.  No decreased range of motion.  No back pain.  Psych:  No change in mood or affect. No depression or anxiety.  No memory loss.         Objective:   Physical Exam  GEN: A/Ox3; pleasant , NAD  HEENT:  [place]/AT,  EACs-clear, TMs-wnl, NOSE-clear, THROAT-clear, no lesions, no postnasal drip or exudate noted.   NECK:  Supple w/ fair ROM; no JVD; normal carotid impulses w/o bruits; no thyromegaly or nodules palpated; no lymphadenopathy.  RESP  Coarse rhonchi , no wheezing. No stridor. Talking in full sentences, no accessory muscle use, no dullness to percussion  CARD:  RRR, no m/r/g  , no peripheral edema, pulses intact, no cyanosis or clubbing.  GI:   Soft & nt; nml bowel sounds; no organomegaly or masses detected.  Musco: Warm bil, no deformities or joint swelling noted.   Neuro: alert, no focal deficits noted.    Skin: Warm, no lesions or rashes  CXR 07/30/13 There is no edema or consolidation. Heart size and pulmonary vascularity are normal. No adenopathy. No bone lesions.        Assessment & Plan:

## 2013-08-02 NOTE — Assessment & Plan Note (Addendum)
Resolving flare complicated by Known pulmonary Aspergillus on intraconazole  Prone to mucus plugging  She is improving with abx and mucolytics/nebs and steroids   Plan  Extend Levaquin 500mg  daily for additional 3 days (total of 10 d)  Mucinex DM Twice daily  As needed  Cough/congestion  Fluids and rest  Finish Prednisone taper as directed.  Follow up with Dr. Lake Bells in 2 weeks and As needed   Use Albuterol Neb every 4-6 hrs As needed  Wheezing /shortness of breath  Please contact office for sooner follow up if symptoms do not improve or worsen or seek emergency care

## 2013-08-02 NOTE — Patient Instructions (Addendum)
Extend Levaquin 500mg  daily for additional 3 days (total of 10 d)  Mucinex DM Twice daily  As needed  Cough/congestion  Fluids and rest  Finish Prednisone taper as directed.  Flutter valve As needed   Follow up with Dr. Lake Bells in 2 weeks and As needed   Use Albuterol Neb every 4-6 hrs As needed  Wheezing /shortness of breath  Please contact office for sooner follow up if symptoms do not improve or worsen or seek emergency care

## 2013-08-02 NOTE — Telephone Encounter (Signed)
Pt had only 30 tablets of Sporanox called in when she was taking them 2 po qd X30 days.  I resent in the rest of her medication.  Nothing further needed.

## 2013-08-05 ENCOUNTER — Telehealth: Payer: Self-pay | Admitting: Adult Health

## 2013-08-05 LAB — HEPATIC FUNCTION PANEL
ALT: 16 U/L (ref 0–35)
AST: 12 U/L (ref 0–37)
Albumin: 3.8 g/dL (ref 3.5–5.2)
Alkaline Phosphatase: 64 U/L (ref 39–117)
Bilirubin, Direct: 0.1 mg/dL (ref 0.0–0.3)
Total Bilirubin: 0.5 mg/dL (ref 0.3–1.2)
Total Protein: 7.4 g/dL (ref 6.0–8.3)

## 2013-08-06 ENCOUNTER — Telehealth: Payer: Self-pay | Admitting: Adult Health

## 2013-08-06 NOTE — Telephone Encounter (Signed)
[place] for work note per [employer] spoke with patient, she will come pick this up at her convenience  Work note done and left up front for pt. Will sign off

## 2013-08-06 NOTE — Telephone Encounter (Signed)
Pt missed work from 07/31/13 to 08/05/13. She is needing a work note.  TP - please advise if it's okay to write a work note. Thanks.

## 2013-08-06 NOTE — Telephone Encounter (Signed)
My apologies, this was my mistake Letter corrected and placed up front  Called spoke with patient's spouse and apologized - will will relay to pt that her letter is at the front for pick up Will sign off

## 2013-08-12 NOTE — Progress Notes (Signed)
I agree with the plan of care.

## 2013-08-12 NOTE — Addendum Note (Signed)
Addended by: Parke Poisson E on: 08/12/2013 01:15 PM   Modules accepted: Orders

## 2013-08-14 ENCOUNTER — Other Ambulatory Visit: Payer: Self-pay

## 2013-08-14 DIAGNOSIS — Z1231 Encounter for screening mammogram for malignant neoplasm of breast: Secondary | ICD-10-CM

## 2013-08-15 LAB — POCT INFLUENZA A/B
Influenza A, POC: NEGATIVE
Influenza B, POC: NEGATIVE

## 2013-08-19 ENCOUNTER — Other Ambulatory Visit: Payer: BC Managed Care – PPO

## 2013-08-19 ENCOUNTER — Encounter: Payer: Self-pay | Admitting: Pulmonary Disease

## 2013-08-19 ENCOUNTER — Ambulatory Visit (INDEPENDENT_AMBULATORY_CARE_PROVIDER_SITE_OTHER): Payer: BC Managed Care – PPO | Admitting: Pulmonary Disease

## 2013-08-19 VITALS — BP 108/66 | HR 78 | Temp 98.1°F | Ht 67.5 in | Wt 155.2 lb

## 2013-08-19 DIAGNOSIS — R059 Cough, unspecified: Secondary | ICD-10-CM

## 2013-08-19 DIAGNOSIS — B4489 Other forms of aspergillosis: Secondary | ICD-10-CM

## 2013-08-19 DIAGNOSIS — J45901 Unspecified asthma with (acute) exacerbation: Secondary | ICD-10-CM

## 2013-08-19 DIAGNOSIS — R05 Cough: Secondary | ICD-10-CM

## 2013-08-19 DIAGNOSIS — B449 Aspergillosis, unspecified: Secondary | ICD-10-CM

## 2013-08-19 LAB — IGE: IgE (Immunoglobulin E), Serum: 180.1 IU/mL — ABNORMAL HIGH (ref 0.0–180.0)

## 2013-08-19 MED ORDER — PREDNISONE 10 MG PO TABS: ORAL_TABLET | ORAL | Status: DC

## 2013-08-19 NOTE — Assessment & Plan Note (Signed)
GERD and some degree of vocal cord dysfunction continue on at least a low level to contribute to cough.  Plan: -GERD lifestyle changes reviewed again

## 2013-08-19 NOTE — Progress Notes (Signed)
Subjective:    Patient ID: Kathryn Griffin, female    DOB: 30-Nov-1955, 58 y.o.   MRN: [id number]  Synopsis: 58 yo female developed cough and thick mucus produciton for the first time in her life in 2013. Produced casts with coughing on several occassions and responded to antibiotic and prednisone therapy.  Bronchoscopy in 04/2013 showed mucus plugging/cast LUL. Bronch cultures grew aspergillus species. Baseline GERD.  Methacholine challenge 01/2013 negative.  IgE low/normal 2014. Eosinophil elevated one time in early 2014.  She was treated with itraconazole for a month and did well by January 2015.  HPI   05/31/2013 Hospital F/U> Kathryn Griffin is here to see me becaues she was recently hospitalized.  She has been coughing up grey sputum for a year now.  She saw Dr. Melvyn Novas recently.  In August she coughed up a mucus plug which was grey in color.  She saw Dr. Benson Norway in [place].  She has acid reflux which is going into her lungs.    She notes that she has had mucs in her lung sfor years.  She has been old that she had vocal cord dysfunction.   Normal childhood and normal adulthood.  Symptoms started last November when in [place] on vacation.  She started with a cold ad had lots of mucus production.  She coughed up a cast (yellow, green in color).  She was treated off and on with steroids and antiobiotics.  In [place] she had a really bad attack where she was experiencing dyspnea.  Rx prednisone and Levaquin and she got better.    Since her recent hospital discharge she feels great.  During that time she had a mucus plug removed during a bronchoscopy.  She has been taking Levaquin since then.  07/18/2013 TROV >> In the last few days Kathryn Griffin has been having an increased cough and mucus production.  She fels a rattling and noise in her chest.  Mucinex made it better.  No sputum production.  Rare sputum production.   She didn't like she that she couldn't drink a glass of wine while on the itraconazole, but for the  most part she has done great on it.  Minimal dyspnea, no albuterol or ciclesonide use.  She is having some acid reflux. >cont on itraconazole   07/30/13 Acute OV  Presents for an acute office visit. Complains of wheezing, increased SOB, prod cough with brown mucus, fever today. Has felt like she was catching a cold for last several days. Went to work today, felt worse as the day progressed. This evening coughing up really thick mucus, fever this evening.  Denies hemoptysis, body aches, vomiting.  Coughing up thick brown clumps of mucus.  Appetite is good, no n/v/d.  >>levaquin and steroid taper  Flu swab neg in office    08/02/2013 Follow up  atient returns for three-day followup. Patient was seen last visit with a severe bronchitis. Patient has underlying known pulmonary Aspergillus on intraconazole .  Chest x-ray showed no acute findings. She was treated with Levaquin 500 mg daily x7 days along with prednisone taper. Patient had difficulties with mucus plugging during her hospitalization in November. She was started on mucolytics, and recommend he use her albuterol nebulizer along with flutter valve. Patient reports that she is feeling much improved. She feels that she is 70% better. Her cough and congestion to decrease. She is now coughing up mainly yellow. Mucus. This is also decreased in thickness, and intensity. Patient denies any hemoptysis, orthopnea, PND, leg  swelling. Her fevers have resolved.  08/19/2013 ROV > Kathryn Griffin came in earlier this month because she was having more cough, dyspnea, and coughing up mucus and thick brown mucus.  She improved with Levaquin, but then in the last few days she has started coughing up brown grey mucus.  She has been using albuterol a lot which helps her clear the mucus.  She still has some cough.  She denies fevers, or chills.  She is worried that the aspergillus is back.  Past Medical History  Diagnosis Date  . Gestational diabetes   . Reflux       Review of Systems  Constitutional: Positive for fatigue. Negative for fever and chills.  HENT: Positive for postnasal drip and sinus pressure. Negative for rhinorrhea.   Respiratory: Positive for cough, shortness of breath and wheezing.   Cardiovascular: Negative for chest pain, palpitations and leg swelling.         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  There certainly has not been evidence of invasive disease.  The ongoing brown mucus production makes me worried that she continues to experience complications from the airway related aspergillus infection.  She has not had evidence of an aspergilloma either.  Plan: -prednisone for six weeks -repeat sputum culture -in one month, restart inhaled steroid  Cough GERD and some degree of vocal cord dysfunction continue on at least a low level to contribute to cough.  Plan: -GERD lifestyle changes reviewed again    Updated Medication List Outpatient Encounter Prescriptions as of 08/19/2013  Medication Sig  . albuterol (PROAIR HFA) 108 (90 BASE) MCG/ACT inhaler Inhale 2 puffs into the lungs every 6 (six) hours as needed.  Marland Kitchen albuterol (PROVENTIL) (2.5 MG/3ML) 0.083% nebulizer solution Take 3 mLs (2.5 mg total) by nebulization every 6 (six) hours as needed for wheezing or shortness of breath.  . Calcium Citrate (CITRACAL  PO) Take 1 tablet by mouth 2 (two) times daily.  . Ciclesonide (ZETONNA) 37 MCG/ACT AERS Place 1 spray into the nose every morning.  Marland Kitchen dextromethorphan-guaiFENesin (MUCINEX DM) 30-600 MG per 12 hr tablet Take 1 tablet by mouth 2 (two) times daily.  Marland Kitchen esomeprazole (NEXIUM) 40 MG capsule Take 40 mg by mouth daily.  . famotidine (PEPCID) 20 MG tablet Take 20 mg by mouth at bedtime.  . itraconazole (SPORANOX) 100 MG capsule Take 2 capsules (200 mg total) by mouth daily.  . Multiple Vitamin (MULTIVITAMIN) capsule Take 1 capsule by mouth daily.    . Estradiol 10 MCG TABS vaginal tablet Place 1 tablet (10 mcg total) vaginally 2 (two) times a week.  . [DISCONTINUED] itraconazole (SPORANOX) 100 MG capsule Take 1 capsule (100 mg total) by mouth 2 (two) times daily.  . [DISCONTINUED] predniSONE (DELTASONE) 10 MG  tablet 4 tabs for 2 days, then 3 tabs for 2 days, 2 tabs for 2 days, then 1 tab for 2 days, then stop  . [DISCONTINUED] sodium chloride HYPERTONIC 3 % nebulizer solution Take by nebulization as needed for other.

## 2013-08-19 NOTE — Assessment & Plan Note (Signed)
As I noted in prior notes, she has some evidence of an ABPA like illness, though there has not been central bronchiectasis noted and I have not sent her for skin testing.  There certainly has not been evidence of invasive disease.  The ongoing brown mucus production makes me worried that she continues to experience complications from the airway related aspergillus infection.  She has not had evidence of an aspergilloma either.  Plan: -prednisone for six weeks -repeat sputum culture -in one month, restart inhaled steroid

## 2013-08-19 NOTE — Patient Instructions (Signed)
Please provide [place] a sample of your mucus (we want brown, thick, nasty mucus) Take the prednisone 30mg  daily for a week, then 20mg  daily until you see me in 4 weeks Take the albuterol three times a day We will see you back in 4 weeks

## 2013-08-21 ENCOUNTER — Other Ambulatory Visit: Payer: BC Managed Care – PPO

## 2013-08-21 DIAGNOSIS — J45901 Unspecified asthma with (acute) exacerbation: Secondary | ICD-10-CM

## 2013-08-28 ENCOUNTER — Telehealth: Payer: Self-pay

## 2013-08-28 ENCOUNTER — Other Ambulatory Visit: Payer: BC Managed Care – PPO

## 2013-08-28 ENCOUNTER — Other Ambulatory Visit: Payer: Self-pay | Admitting: Pulmonary Disease

## 2013-08-28 DIAGNOSIS — J209 Acute bronchitis, unspecified: Secondary | ICD-10-CM

## 2013-08-28 DIAGNOSIS — B4489 Other forms of aspergillosis: Secondary | ICD-10-CM

## 2013-08-28 NOTE — Telephone Encounter (Signed)
Message copied by Len Blalock on Wed Aug 28, 2013  1:37 PM ------      Message from: Simonne Maffucci B      Created: Wed Aug 28, 2013  1:19 PM       FYI      I added on a fungal culture order for her sputum.  Not sure if we need to call anyone for that.      Thanks      B ------

## 2013-08-28 NOTE — Telephone Encounter (Signed)
Lab aware.  Nothing further needed.

## 2013-08-28 NOTE — Telephone Encounter (Signed)
Call from lab at [facility] received for lab order for sputum culture.  Lab order has been placed.  Nothing further needed.

## 2013-08-30 ENCOUNTER — Ambulatory Visit
Admission: RE | Admit: 2013-08-30 | Discharge: 2013-08-30 | Disposition: A | Discharge status: Home / Self Care | Payer: BC Managed Care – PPO | Source: Ambulatory Visit

## 2013-08-30 DIAGNOSIS — Z1231 Encounter for screening mammogram for malignant neoplasm of breast: Secondary | ICD-10-CM

## 2013-09-02 ENCOUNTER — Telehealth: Payer: Self-pay | Admitting: Pulmonary Disease

## 2013-09-02 NOTE — Telephone Encounter (Signed)
Pt is requesting lab results from ov 08/19/13.  Please advise on results

## 2013-09-02 NOTE — Telephone Encounter (Signed)
Please let her know that her IgE level was slightly higher than normal so that supports our decision to treat her with prednisone

## 2013-09-03 NOTE — Telephone Encounter (Signed)
Pt is aware of results. A detailed message was left per her request. Nothing further was needed.

## 2013-09-16 ENCOUNTER — Encounter: Payer: Self-pay | Admitting: Pulmonary Disease

## 2013-09-16 ENCOUNTER — Ambulatory Visit (INDEPENDENT_AMBULATORY_CARE_PROVIDER_SITE_OTHER): Payer: BC Managed Care – PPO | Admitting: Pulmonary Disease

## 2013-09-16 VITALS — BP 116/70 | HR 63 | Ht 67.5 in | Wt 159.0 lb

## 2013-09-16 DIAGNOSIS — B449 Aspergillosis, unspecified: Secondary | ICD-10-CM

## 2013-09-16 DIAGNOSIS — B4489 Other forms of aspergillosis: Secondary | ICD-10-CM

## 2013-09-16 MED ORDER — AEROCHAMBER MV MISC: Status: AC

## 2013-09-16 MED ORDER — PREDNISONE 10 MG PO TABS: 10.0000 mg | ORAL_TABLET | Freq: Every day | ORAL | Status: DC

## 2013-09-16 NOTE — Assessment & Plan Note (Signed)
Kathryn Griffin has done well with the prednisone treatment for her ABPA-like picture for the last month.  Because she still was coughing up brown mucus as recent as two weeks ago I am going to extend her total dose of prednisone by two weeks.  Hopefully we can maintain her on inhaled corticosteroids afterwards.  Plan: -continue prednisone 10mg  daily for 4 weeks -at completion of prednisone, take QVar 44mcg 1 puff bid with spacer -f/u three months

## 2013-09-16 NOTE — Patient Instructions (Signed)
Keep taking 10mg  of prednisone daily for the next four weeks At the completion of the prednisone, start taking QVar 6mcg, one puff with a spacer twice a day; the sample we gave you today should last for two months We will see you back in 3 months or sooner if needed

## 2013-09-16 NOTE — Progress Notes (Signed)
Subjective:    Patient ID: Kathryn Griffin, female    DOB: 10/21/1955, 58 y.o.   MRN: [id number]  Synopsis: 58 yo female developed cough and thick mucus produciton for the first time in her life in 2013. Produced casts with coughing on several occassions and responded to antibiotic and prednisone therapy.  Bronchoscopy in 04/2013 showed mucus plugging/cast LUL. Bronch cultures grew aspergillus species. Baseline GERD.  Methacholine challenge 01/2013 negative.  IgE low/normal 2014. Eosinophil elevated one time in early 2014.  She was treated with itraconazole for a month and did well by January 2015.  HPI   05/31/2013 Hospital F/U> Kathryn Griffin is here to see me becaues she was recently hospitalized.  She has been coughing up grey sputum for a year now.  She saw Dr. Melvyn Novas recently.  In August she coughed up a mucus plug which was grey in color.  She saw Dr. Benson Norway in [place].  She has acid reflux which is going into her lungs.    She notes that she has had mucs in her lung sfor years.  She has been old that she had vocal cord dysfunction.   Normal childhood and normal adulthood.  Symptoms started last November when in [place] on vacation.  She started with a cold ad had lots of mucus production.  She coughed up a cast (yellow, green in color).  She was treated off and on with steroids and antiobiotics.  In [place] she had a really bad attack where she was experiencing dyspnea.  Rx prednisone and Levaquin and she got better.    Since her recent hospital discharge she feels great.  During that time she had a mucus plug removed during a bronchoscopy.  She has been taking Levaquin since then.  07/18/2013 TROV >> In the last few days Teal has been having an increased cough and mucus production.  She fels a rattling and noise in her chest.  Mucinex made it better.  No sputum production.  Rare sputum production.   She didn't like she that she couldn't drink a glass of wine while on the itraconazole, but for the  most part she has done great on it.  Minimal dyspnea, no albuterol or ciclesonide use.  She is having some acid reflux. >cont on itraconazole   07/30/13 Acute OV  Presents for an acute office visit. Complains of wheezing, increased SOB, prod cough with brown mucus, fever today. Has felt like she was catching a cold for last several days. Went to work today, felt worse as the day progressed. This evening coughing up really thick mucus, fever this evening.  Denies hemoptysis, body aches, vomiting.  Coughing up thick brown clumps of mucus.  Appetite is good, no n/v/d.  >>levaquin and steroid taper  Flu swab neg in office    08/02/2013 Follow up  atient returns for three-day followup. Patient was seen last visit with a severe bronchitis. Patient has underlying known pulmonary Aspergillus on intraconazole .  Chest x-ray showed no acute findings. She was treated with Levaquin 500 mg daily x7 days along with prednisone taper. Patient had difficulties with mucus plugging during her hospitalization in November. She was started on mucolytics, and recommend he use her albuterol nebulizer along with flutter valve. Patient reports that she is feeling much improved. She feels that she is 70% better. Her cough and congestion to decrease. She is now coughing up mainly yellow. Mucus. This is also decreased in thickness, and intensity. Patient denies any hemoptysis, orthopnea, PND, leg  swelling. Her fevers have resolved.  08/19/2013 ROV > Kathryn Griffin came in earlier this month because she was having more cough, dyspnea, and coughing up mucus and thick brown mucus.  She improved with Levaquin, but then in the last few days she has started coughing up brown grey mucus.  She has been using albuterol a lot which helps her clear the mucus.  She still has some cough.  She denies fevers, or chills.  She is worried that the aspergillus is back.  09/16/2013 ROV > Kathryn Griffin is doing a lot better lately. She has been taking the  prednisone regularly.  She has not coughed up any thick brown mucus in about two weeks now.  She has not had the repeated cough again.  She has clear yellow mucus right now.  She is back to exercising regularly again.    Past Medical History  Diagnosis Date  . Gestational diabetes   . Reflux      Review of Systems  Constitutional: Negative for fever, chills and fatigue.  HENT: Negative for postnasal drip, rhinorrhea and sinus pressure.   Respiratory: Negative for cough, shortness of breath and wheezing.   Cardiovascular: Negative for chest pain, palpitations and leg swelling.         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 Lave has done well with the prednisone treatment for her ABPA-like picture for the last month.  Because she still was coughing up brown mucus as recent as two weeks ago I am going to extend her total dose of prednisone by two weeks.  Hopefully we can maintain her on inhaled corticosteroids afterwards.  Plan: -continue prednisone 10mg  daily for 4 weeks -at completion of prednisone, take QVar 38mcg 1 puff bid with spacer -f/u three months    Updated Medication List Outpatient Encounter Prescriptions as of 09/16/2013  Medication Sig  . albuterol (PROAIR HFA) 108 (90 BASE) MCG/ACT inhaler Inhale 2 puffs into the lungs every 6 (six) hours as needed.  Marland Kitchen albuterol (PROVENTIL) (2.5 MG/3ML) 0.083% nebulizer solution Take 3 mLs (2.5 mg total) by nebulization every 6 (six) hours as needed for wheezing or shortness of breath.  . Calcium Citrate (CITRACAL PO) Take 1 tablet  by mouth 2 (two) times daily.  . Ciclesonide (ZETONNA) 37 MCG/ACT AERS Place 1 spray into the nose every morning.  Marland Kitchen esomeprazole (NEXIUM) 40 MG capsule Take 40 mg by mouth daily.  . Estradiol 10 MCG TABS vaginal tablet Place 1 tablet (10 mcg total) vaginally 2 (two) times a week.  . famotidine (PEPCID) 20 MG tablet Take 20 mg by mouth at bedtime.  . Multiple Vitamin (MULTIVITAMIN) capsule Take 1 capsule by mouth daily.    . predniSONE (DELTASONE) 10 MG tablet 30mg  daily for one week, then 20mg  daily for three weeks  . [DISCONTINUED] dextromethorphan-guaiFENesin (MUCINEX DM) 30-600 MG per 12 hr tablet Take 1 tablet by mouth 2 (two) times daily.  . [DISCONTINUED] itraconazole (SPORANOX) 100 MG capsule Take 2 capsules (200 mg total) by mouth  daily.

## 2013-09-17 LAB — FUNGUS CULTURE W SMEAR: Smear Result: NONE SEEN

## 2013-10-02 ENCOUNTER — Encounter: Payer: Self-pay | Admitting: Pulmonary Disease

## 2013-10-02 LAB — FUNGUS CULTURE W SMEAR: Smear Result: NONE SEEN

## 2013-10-23 ENCOUNTER — Telehealth: Payer: Self-pay | Admitting: Pulmonary Disease

## 2013-10-23 MED ORDER — PREDNISONE 10 MG PO TABS: ORAL_TABLET | ORAL | Status: DC

## 2013-10-23 MED ORDER — DOXYCYCLINE HYCLATE 100 MG PO TABS
100.0000 mg | ORAL_TABLET | Freq: Two times a day (BID) | ORAL | Status: DC
Start: 2013-10-23 — End: 2013-11-19

## 2013-10-23 NOTE — Telephone Encounter (Signed)
Prednisone: Take 40mg  po daily for 3 days, then take 30mg  po daily for 3 days, then take 20mg  po daily for two days, then take 10mg  po daily for 2 days Doxycycline 100mg  po bid x 7 days (advise to take with yogurt and use sunscreen due to sun sensitivity)  If no improvement 2-3 days then see Kathryn Griffin in office  Call to let Kathryn Griffin know how she does with this

## 2013-10-23 NOTE — Telephone Encounter (Signed)
Pt is aware of BQ's recs. Rx's have been sent to [facility] in [place], [place] per her request. Nothing further was needed.

## 2013-10-23 NOTE — Telephone Encounter (Signed)
Spoke with pt. Reports SOB, cough, wheezing and chest tightness. Mucus is brown in color. Denies fever. Onset was 2 days. Has taken Mucinex, Albuterol treatment and Qvar with minimal relief.  BQ - please advise. Thanks!

## 2013-10-28 ENCOUNTER — Ambulatory Visit: Payer: BC Managed Care – PPO | Admitting: Adult Health

## 2013-10-28 ENCOUNTER — Encounter: Payer: Self-pay | Admitting: Adult Health

## 2013-10-28 ENCOUNTER — Ambulatory Visit (INDEPENDENT_AMBULATORY_CARE_PROVIDER_SITE_OTHER): Payer: BC Managed Care – PPO | Admitting: Adult Health

## 2013-10-28 ENCOUNTER — Ambulatory Visit (INDEPENDENT_AMBULATORY_CARE_PROVIDER_SITE_OTHER)
Admission: RE | Admit: 2013-10-28 | Discharge: 2013-10-28 | Disposition: A | Discharge status: Home / Self Care | Payer: BC Managed Care – PPO | Source: Ambulatory Visit | Attending: Adult Health | Admitting: Adult Health

## 2013-10-28 VITALS — BP 124/76 | HR 88 | Temp 97.9°F | Ht 67.5 in | Wt 161.2 lb

## 2013-10-28 DIAGNOSIS — R0989 Other specified symptoms and signs involving the circulatory and respiratory systems: Secondary | ICD-10-CM

## 2013-10-28 DIAGNOSIS — J209 Acute bronchitis, unspecified: Secondary | ICD-10-CM

## 2013-10-28 DIAGNOSIS — R0600 Dyspnea, unspecified: Secondary | ICD-10-CM

## 2013-10-28 DIAGNOSIS — R0609 Other forms of dyspnea: Secondary | ICD-10-CM

## 2013-10-28 MED ORDER — HYDROCODONE-HOMATROPINE 5-1.5 MG/5ML PO SYRP: 5.0000 mL | ORAL_SOLUTION | Freq: Four times a day (QID) | ORAL | Status: DC | PRN

## 2013-10-28 NOTE — Progress Notes (Signed)
Subjective:    Patient ID: Kathryn Griffin, female    DOB: [birth year], 58 y.o.   MRN: [id number]  HPI  Synopsis: 58 yo female developed cough and thick mucus produciton for the first time in her life in 2013. Produced casts with coughing on several occassions and responded to antibiotic and prednisone therapy.  Bronchoscopy in 04/2013 showed mucus plugging/cast LUL. Bronch cultures grew aspergillus species. Baseline GERD.  Methacholine challenge 01/2013 negative.  IgE low/normal 2014. Eosinophil elevated one time in early 2014.  She was treated with itraconazole for a month and did well by January 2015.  HPI   05/31/2013 Hospital F/U> Kathryn Griffin is here to see me becaues she was recently hospitalized.  She has been coughing up grey sputum for a year now.  She saw Dr. Melvyn Novas recently.  In August she coughed up a mucus plug which was grey in color.  She saw Dr. Benson Norway in [place].  She has acid reflux which is going into her lungs.    She notes that she has had mucs in her lung sfor years.  She has been old that she had vocal cord dysfunction.   Normal childhood and normal adulthood.  Symptoms started last November when in [place] on vacation.  She started with a cold ad had lots of mucus production.  She coughed up a cast (yellow, green in color).  She was treated off and on with steroids and antiobiotics.  In [place] she had a really bad attack where she was experiencing dyspnea.  Rx prednisone and Levaquin and she got better.    Since her recent hospital discharge she feels great.  During that time she had a mucus plug removed during a bronchoscopy.  She has been taking Levaquin since then.  07/18/2013 TROV >> In the last few days Kathryn Griffin has been having an increased cough and mucus production.  She fels a rattling and noise in her chest.  Mucinex made it better.  No sputum production.  Rare sputum production.   She didn't like she that she couldn't drink a glass of wine while on the itraconazole, but for  the most part she has done great on it.  Minimal dyspnea, no albuterol or ciclesonide use.  She is having some acid reflux. >cont on itraconazole   07/30/13 Acute OV  Presents for an acute office visit. Complains of wheezing, increased SOB, prod cough with brown mucus, fever today. Has felt like she was catching a cold for last several days. Went to work today, felt worse as the day progressed. This evening coughing up really thick mucus, fever this evening.  Denies hemoptysis, body aches, vomiting.  Coughing up thick brown clumps of mucus.  Appetite is good, no n/v/d.  >>levaquin and steroid taper  Flu swab neg in office    08/02/2013 Follow up  atient returns for three-day followup. Patient was seen last visit with a severe bronchitis. Patient has underlying known pulmonary Aspergillus on intraconazole .  Chest x-ray showed no acute findings. She was treated with Levaquin 500 mg daily x7 days along with prednisone taper. Patient had difficulties with mucus plugging during her hospitalization in November. She was started on mucolytics, and recommend he use her albuterol nebulizer along with flutter valve. Patient reports that she is feeling much improved. She feels that she is 70% better. Her cough and congestion to decrease. She is now coughing up mainly yellow. Mucus. This is also decreased in thickness, and intensity. Patient denies any hemoptysis, orthopnea,  PND, leg swelling. Her fevers have resolved.  08/19/2013 ROV > Kathryn Griffin came in earlier this month because she was having more cough, dyspnea, and coughing up mucus and thick brown mucus.  She improved with Levaquin, but then in the last few days she has started coughing up brown grey mucus.  She has been using albuterol a lot which helps her clear the mucus.  She still has some cough.  She denies fevers, or chills.  She is worried that the aspergillus is back.  09/16/2013 ROV > Kathryn Griffin is doing a lot better lately. She has been taking the  prednisone regularly.  She has not coughed up any thick brown mucus in about two weeks now.  She has not had the repeated cough again.  She has clear yellow mucus right now.  She is back to exercising regularly again.    10/28/2013 Acute OV  Complains of  increased SOB, tightness, prod cough with yellow/clear mucus x4days - Describes. Cough is very harsh at times. Says she was traveling out of town in the [place] when symptoms began. Denies f/c/s, hemoptysis, nausea, vomiting.  Was given Doxycycline and steroid taper on 4/29.  CXR today with no acute process .  Cough is keeping her up at night. She is taking Mucinex with some help. Was unable to go to work. Today.      Review of Systems Constitutional:   No  weight loss, night sweats,  Fevers, chills, fatigue, or  lassitude.  HEENT:   No headaches,  Difficulty swallowing,  Tooth/dental problems, or  Sore throat,                No sneezing, itching, ear ache,  +nasal congestion, post nasal drip,   CV:  No chest pain,  Orthopnea, PND, swelling in lower extremities, anasarca, dizziness, palpitations, syncope.   GI  No heartburn, indigestion, abdominal pain, nausea, vomiting, diarrhea, change in bowel habits, loss of appetite, bloody stools.   Resp:   No chest wall deformity  Skin: no rash or lesions.  GU: no dysuria, change in color of urine, no urgency or frequency.  No flank pain, no hematuria   MS:  No joint pain or swelling.  No decreased range of motion.  No back pain.  Psych:  No change in mood or affect. No depression or anxiety.  No memory loss.         Objective:   Physical Exam GEN: A/Ox3; pleasant , NAD, well nourished   HEENT:  [place]/AT,  EACs-clear, TMs-wnl, NOSE-clear, THROAT-clear, no lesions, no postnasal drip or exudate noted.   NECK:  Supple w/ fair ROM; no JVD; normal carotid impulses w/o bruits; no thyromegaly or nodules palpated; no lymphadenopathy.  RESP  Few rhonchi , no accessory muscle use, no  dullness to percussion  CARD:  RRR, no m/r/g  , no peripheral edema, pulses intact, no cyanosis or clubbing.  GI:   Soft & nt; nml bowel sounds; no organomegaly or masses detected.  Musco: Warm bil, no deformities or joint swelling noted.   Neuro: alert, no focal deficits noted.    Skin: Warm, no lesions or rashes         Assessment & Plan:

## 2013-10-28 NOTE — Patient Instructions (Signed)
Finish Doxycyline and Prednisone as directed.  Mucinex DM .Twice daily  As needed  Cough/congestion  Fluids and rest  Flutter valve As needed   Hydromet 1-2 tsp every 4hrs as needed for cough, may make you sleepy.  Follow up with Dr. Lake Bells in 3-4 weeks and As needed   Use Albuterol Neb every 4-6 hrs As needed  Wheezing /shortness of breath  Please contact office for sooner follow up if symptoms do not improve or worsen or seek emergency care

## 2013-10-28 NOTE — Assessment & Plan Note (Signed)
Slow to resolve flare  cxr w/ no acute process  Plan  Finish Doxycyline and Prednisone as directed.  Mucinex DM .Twice daily  As needed  Cough/congestion  Fluids and rest  Flutter valve As needed   Hydromet 1-2 tsp every 4hrs as needed for cough, may make you sleepy.  Follow up with Dr. Lake Bells in 3-4 weeks and As needed   Use Albuterol Neb every 4-6 hrs As needed  Wheezing /shortness of breath  Please contact office for sooner follow up if symptoms do not improve or worsen or seek emergency care

## 2013-10-29 NOTE — Progress Notes (Signed)
I agree with above 

## 2013-10-31 ENCOUNTER — Telehealth: Payer: Self-pay | Admitting: Adult Health

## 2013-10-31 NOTE — Progress Notes (Signed)
Quick Note:  LMOM TCB x1. ______ 

## 2013-11-01 NOTE — Telephone Encounter (Signed)
Had called pt yesterday 5.7.15 to review cxr results: Result Notes    Notes Recorded by Rinaldo Ratel, CMA on 10/31/2013 at 11:32 AM LMOM TCB x1. ------  Notes Recorded by Melvenia Needles, NP on 10/29/2013 at 10:32 AM cxr is ok  Please contact office for sooner follow up if symptoms do not improve or worsen or seek emergency care     [facility] TCB x2 and also informed pt that her results have been released to [facility].

## 2013-11-01 NOTE — Progress Notes (Signed)
Quick Note:  See 5.7.15 phone note. ______

## 2013-11-01 NOTE — Telephone Encounter (Signed)
Called spoke with patient.  Apologized to patient - was not aware she was already aware of her cxr results.  Pt okay with this.  Pt did report that on 5.5.15 (the day after her appt with TP) she began having prod cough with thick brown/gray mucus that equalled about 2 Tbsp.  On 5.6.15 the mucus was yellow and yesterday 5.7.15 the mucus was mostly clear with a tinge of yellow.  So far today mucus has been clear.  Pt denies any dyspnea, wheezing, tightness, f/c/s and reports that she is "fine."  Advised pt to continue with the Mucinex DM twice daily, push fluids and rest as much as possible.  Advised pt to call over the weekend or seek emergency assistance if her symptoms worsen.  Otherwise, she may call on Monday for a sooner appt if needed.  Pt okay with these recommendations and verbalized her understanding on all of the above.  Nothing further needed at this time; will sign off.

## 2013-11-01 NOTE — Telephone Encounter (Signed)
Pt is returning call.  Pt states she saw TP on Monday-states she was told lungs are clear.  On Tuesday started couging up brownish gray mucus-about 2 tablespoons worth.  Concerned about this.  Will finish prednisone today.  Would like to know if there is anything else she should be doing.  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  Thanks!  Kathryn Griffin

## 2013-11-13 ENCOUNTER — Other Ambulatory Visit: Payer: Self-pay

## 2013-11-13 MED ORDER — ESTRADIOL 10 MCG VA TABS: 1.0000 | ORAL_TABLET | VAGINAL | Status: DC

## 2013-11-14 ENCOUNTER — Telehealth: Payer: Self-pay | Admitting: Pulmonary Disease

## 2013-11-14 NOTE — Telephone Encounter (Signed)
LMTCB

## 2013-11-14 NOTE — Telephone Encounter (Signed)
Spoke with the pt  She states that she wants to go and visit her father in the hospital, but she is concerned b/c of her cough and does not want to make things worse  I advised that she should be fine as long as she wears a mask and makes sure she washes her hands Pt verbalized understanding and nothing further needed

## 2013-11-18 ENCOUNTER — Telehealth: Payer: Self-pay | Admitting: Internal Medicine

## 2013-11-18 NOTE — Telephone Encounter (Signed)
Called stating qvar sample still has over 40 puffs left but is clogged up.  Count should be closer to zero by now if taking one bid x 2 months and the fact that it's clogged suggests no using consistently   rec reviewed how to flush the device with tap water and if not effective will come by office 5/26 for sample/ rx but due f/u with Dr Lake Bells as well

## 2013-11-19 ENCOUNTER — Ambulatory Visit (INDEPENDENT_AMBULATORY_CARE_PROVIDER_SITE_OTHER): Payer: BC Managed Care – PPO | Admitting: Pulmonary Disease

## 2013-11-19 ENCOUNTER — Encounter: Payer: Self-pay | Admitting: Pulmonary Disease

## 2013-11-19 VITALS — BP 114/66 | HR 77 | Ht 67.5 in | Wt 163.0 lb

## 2013-11-19 DIAGNOSIS — B449 Aspergillosis, unspecified: Secondary | ICD-10-CM

## 2013-11-19 DIAGNOSIS — B4489 Other forms of aspergillosis: Secondary | ICD-10-CM

## 2013-11-19 NOTE — Patient Instructions (Signed)
Take the QVar at 73mcg twice a day with a spacer Use a flutter valve and have your family give you chest PT (what I showed you in clinic) on days that you are having more chest congestion We will see you back in 4 months or sooner if needed

## 2013-11-19 NOTE — Assessment & Plan Note (Signed)
Kathryn Griffin has been doing fairly well since starting the Qvar. However, she continues to have some mucus production on a daily basis. She says that compared to one year ago she is significantly better in that she no longer has shortness of breath.  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  Plan: -Qvar 80 mcg twice a day (it sounds like she is only on 40 mcg at home) -Followup 4 months, if worsening shortness of breath between now and then first try 2 puffs of Qvar 80 mcg twice a day, then if no improvement use a prednisone taper

## 2013-11-19 NOTE — Progress Notes (Signed)
Subjective:    Patient ID: Kathryn Griffin, female    DOB: [birth year], 58 y.o.   MRN: [id number]  Synopsis: 58 yo female developed cough and thick mucus produciton for the first time in her life in 2013. Produced casts with coughing on several occassions and responded to antibiotic and prednisone therapy.  Bronchoscopy in 04/2013 showed mucus plugging/cast LUL. Bronch cultures grew aspergillus species. Baseline GERD.  Methacholine challenge 01/2013 negative.  IgE low/normal 2014. Eosinophil elevated one time in early 2014.  She was treated with itraconazole for a month and did well by January 2015.  HPI   05/31/2013 Hospital F/U> Kathryn Griffin is here to see me becaues she was recently hospitalized.  She has been coughing up grey sputum for a year now.  She saw Dr. Melvyn Novas recently.  In August she coughed up a mucus plug which was grey in color.  She saw Dr. Benson Norway in [place].  She has acid reflux which is going into her lungs.    She notes that she has had mucs in her lung sfor years.  She has been old that she had vocal cord dysfunction.   Normal childhood and normal adulthood.  Symptoms started last November when in [place] on vacation.  She started with a cold ad had lots of mucus production.  She coughed up a cast (yellow, green in color).  She was treated off and on with steroids and antiobiotics.  In [place] she had a really bad attack where she was experiencing dyspnea.  Rx prednisone and Levaquin and she got better.    Since her recent hospital discharge she feels great.  During that time she had a mucus plug removed during a bronchoscopy.  She has been taking Levaquin since then.  07/18/2013 TROV >> In the last few days [name] has been having an increased cough and mucus production.  She fels a rattling and noise in her chest.  Mucinex made it better.  No sputum production.  Rare sputum production.   She didn't like she that she couldn't drink a glass of wine while on the itraconazole, but for the  most part she has done great on it.  Minimal dyspnea, no albuterol or ciclesonide use.  She is having some acid reflux. >cont on itraconazole   07/30/13 Acute OV  Presents for an acute office visit. Complains of wheezing, increased SOB, prod cough with brown mucus, fever today. Has felt like she was catching a cold for last several days. Went to work today, felt worse as the day progressed. This evening coughing up really thick mucus, fever this evening.  Denies hemoptysis, body aches, vomiting.  Coughing up thick brown clumps of mucus.  Appetite is good, no n/v/d.  >>levaquin and steroid taper  Flu swab neg in office    08/02/2013 Follow up  atient returns for three-day followup. Patient was seen last visit with a severe bronchitis. Patient has underlying known pulmonary Aspergillus on intraconazole .  Chest x-ray showed no acute findings. She was treated with Levaquin 500 mg daily x7 days along with prednisone taper. Patient had difficulties with mucus plugging during her hospitalization in November. She was started on mucolytics, and recommend he use her albuterol nebulizer along with flutter valve. Patient reports that she is feeling much improved. She feels that she is 70% better. Her cough and congestion to decrease. She is now coughing up mainly yellow. Mucus. This is also decreased in thickness, and intensity. Patient denies any hemoptysis, orthopnea, PND, leg  swelling. Her fevers have resolved.  08/19/2013 ROV > Kathryn Griffin came in earlier this month because she was having more cough, dyspnea, and coughing up mucus and thick brown mucus.  She improved with Levaquin, but then in the last few days she has started coughing up brown grey mucus.  She has been using albuterol a lot which helps her clear the mucus.  She still has some cough.  She denies fevers, or chills.  She is worried that the aspergillus is back.  09/16/2013 ROV > Kathryn Griffin is doing a lot better lately. She has been taking the  prednisone regularly.  She has not coughed up any thick brown mucus in about two weeks now.  She has not had the repeated cough again.  She has clear yellow mucus right now.  She is back to exercising regularly again.    11/19/2013 ROV > Kathryn Griffin has been doing okay since the last visit. Unfortunately, she did have to receive a dose of prednisone and antibiotics back in April. Since then she's been doing OK. For some reason it sounds like she only has a 40 mcg inhaler of Qvar at home. She is just been using 1 puff twice a day. She recently had some problems with this getting blocked up which are now resolved. She says that she no longer has shortness of breath. She does continue to have some mucus production on a nearly daily basis. Probably 4-5 times since the last visit she estimates she has had a cough productive of hard brown mucus.  Past Medical History  Diagnosis Date  . Gestational diabetes   . Reflux      Review of Systems  Constitutional: Negative for fever, chills and fatigue.  HENT: Negative for postnasal drip, rhinorrhea and sinus pressure.   Respiratory: Negative for cough, shortness of breath and wheezing.   Cardiovascular: Negative for chest pain, palpitations and leg swelling.         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 However, she continues to have some mucus production on a daily basis. She says that compared to one year ago she is significantly better in that she no longer has shortness of breath.  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  Plan: -Qvar 80 mcg twice a day (it sounds like she is only on 40 mcg at home) -Followup 4 months, if worsening shortness of breath between now and then first try 2 puffs of Qvar 80 mcg twice a day, then if no improvement use a prednisone taper    Updated Medication List Outpatient Encounter Prescriptions as of 11/19/2013  Medication Sig  . albuterol (PROAIR HFA) 108 (90 BASE) MCG/ACT inhaler Inhale 2 puffs into the lungs every 6 (six) hours as needed.  Marland Kitchen albuterol (PROVENTIL) (2.5 MG/3ML) 0.083% nebulizer solution Take 3 mLs (2.5 mg total) by nebulization every 6 (six) hours as needed for wheezing or shortness of breath.  Marland Kitchen  beclomethasone (QVAR) 80 MCG/ACT inhaler Inhale 1 puff into the lungs daily.  . Calcium Citrate (CITRACAL PO) Take 1 tablet by mouth 2 (two) times daily.  . Ciclesonide (ZETONNA) 37 MCG/ACT AERS Place 1 spray into the nose every morning.  Marland Kitchen esomeprazole (NEXIUM) 40 MG capsule Take 40 mg by mouth daily.  . Estradiol 10 MCG TABS vaginal tablet Place 1 tablet (10 mcg total) vaginally 2 (two) times a week.  . famotidine (PEPCID) 20 MG tablet Take 20 mg by mouth at bedtime.  . Multiple Vitamin (MULTIVITAMIN) capsule Take 1 capsule by mouth daily.    Marland Kitchen Spacer/Aero-Holding Chambers (AEROCHAMBER MV) inhaler Use as instructed  . [DISCONTINUED] doxycycline (VIBRA-TABS) 100 MG tablet Take 1 tablet (100 mg total) by mouth 2 (two) times daily.  . [DISCONTINUED] HYDROcodone-homatropine (HYDROMET) 5-1.5 MG/5ML syrup Take 5 mLs by mouth every 6 (six) hours as needed for cough.  . [DISCONTINUED] predniSONE (DELTASONE) 10 MG tablet 40mg  daily for 3 days, 30mg  daily for 3 days, 20mg  daily for two days, 10mg  daily for 2 days

## 2013-11-20 NOTE — Telephone Encounter (Signed)
lmomtcb x1 

## 2013-11-21 NOTE — Telephone Encounter (Signed)
[place] x 1.  Unable to leave message due to detailed questions.  Please clarify with pt if she was having issues with Qvar being clogged.

## 2013-11-21 NOTE — Telephone Encounter (Signed)
Increase to 179mcg bid, if no improvement in weeks at that dose, increase to 378mcg bid

## 2013-11-21 NOTE — Telephone Encounter (Signed)
I want her to take 47mcg bid

## 2013-11-21 NOTE — Telephone Encounter (Signed)
Patient calling needing to clarify inhaler instructions-- pt was seen 11/19/13 by BQ and states she told the nurse that she was using QVAR 9mcg 1 puff qd-- was advised at end of OV to increase dose of QVAR to 40mcg 1 puff twice daily. Pt wanting to make sure that Dr Lake Bells is aware that her initial dose was 31mcg not 44mcg. I advised pt that we have QVAR 30mcg on her medlist, not 69mcg, so this new increased dose should be correct.   Patient requesting clarification-- Is patient to go ahead and increase from 1 puff QD to BID or do you want to change to a different inhaler?  Please advise Dr Lake Bells. Thanks.

## 2013-11-21 NOTE — Telephone Encounter (Signed)
Pt is calling back please leave her a detailed msg she is in the middle of eog's at school and is unable to answer her phone [phone]

## 2013-11-21 NOTE — Telephone Encounter (Signed)
Pt states that there must have been a misunderstanding when she came into the office-- she is now saying that she told the nurse that she was taking QVAR 72mcg BID-- somehow 63mcg 1 puff qd was put on her med list. Instructions for pt on AVS stated: Patient Instructions     Take the QVar at 34mcg twice a day with a spacer  Use a flutter valve and have your family give you chest PT (what I showed you in clinic) on days that you are having more chest congestion  We will see you back in 4 months or sooner if needed   Patient states that she realized when she got home that she was already taking this dose (BID) and it is not helping. Would like to know what she can do further since this is the max dose for this inhaler. Chang to a new inhaler?  Please advise Dr Lake Bells. Thanks.

## 2013-11-21 NOTE — Telephone Encounter (Signed)
Spoke with pt and advised of Dr Anastasia Pall recommendations.  Pt verbalized understanding and will let us know how this works.

## 2013-11-21 NOTE — Telephone Encounter (Signed)
LMOM x 1 

## 2013-11-28 ENCOUNTER — Telehealth: Payer: Self-pay | Admitting: Pulmonary Disease

## 2013-11-28 NOTE — Telephone Encounter (Signed)
Called and spoke with pt and she stated that since BQ increased her qvar use she has been coughing a lot more.  She is having a deep cough with brown/yellow/clear sputum at different times.   She stated that she has an appt this afternoon with her PCP but would like recs from BQ when he comes back tomorrow.  BQ please advise. Thanks  Allergies  Allergen Reactions  . Protonix [Pantoprazole Sodium]      Current Outpatient Prescriptions on File Prior to Visit  Medication Sig Dispense Refill  . albuterol (PROAIR HFA) 108 (90 BASE) MCG/ACT inhaler Inhale 2 puffs into the lungs every 6 (six) hours as needed.      Marland Kitchen albuterol (PROVENTIL) (2.5 MG/3ML) 0.083% nebulizer solution Take 3 mLs (2.5 mg total) by nebulization every 6 (six) hours as needed for wheezing or shortness of breath.  75 mL  12  . beclomethasone (QVAR) 80 MCG/ACT inhaler Inhale 2 puffs into the lungs 2 (two) times daily.       . Calcium Citrate (CITRACAL PO) Take 1 tablet by mouth 2 (two) times daily.      . Ciclesonide (ZETONNA) 37 MCG/ACT AERS Place 1 spray into the nose every morning.      Marland Kitchen esomeprazole (NEXIUM) 40 MG capsule Take 40 mg by mouth daily.      . Estradiol 10 MCG TABS vaginal tablet Place 1 tablet (10 mcg total) vaginally 2 (two) times a week.  24 tablet  1  . famotidine (PEPCID) 20 MG tablet Take 20 mg by mouth at bedtime.      . Multiple Vitamin (MULTIVITAMIN) capsule Take 1 capsule by mouth daily.        Marland Kitchen Spacer/Aero-Holding Chambers (AEROCHAMBER MV) inhaler Use as instructed  1 each  0   No current facility-administered medications on file prior to visit.

## 2013-11-29 ENCOUNTER — Telehealth: Payer: Self-pay | Admitting: Pulmonary Disease

## 2013-11-29 MED ORDER — PREDNISONE 20 MG PO TABS: 20.0000 mg | ORAL_TABLET | Freq: Every day | ORAL | Status: DC

## 2013-11-29 NOTE — Telephone Encounter (Signed)
I really doubt this is due to the QVar; given her history she should increase the QVar to 4 puffs bid, if not better by Monday then take a short course of prednisone (20mg  po daily x5 days) But she should have her PCP take a look at her to see if there is any sinus problems.

## 2013-11-29 NOTE — Telephone Encounter (Signed)
Called spoke with patient whom did see her PCP yesterday >> PCP advised that her lungs are clear/no PNA; her O2 was 93% ra when first checked, then increased to 95% ra; labs were ordered b/c PCP thinks that pt's K+ may be low d/t the prednisone; PCP did question pt's symptoms being related to allergies/sinus.  Discussed with pt BQ's recs to increase the QVAR  To 4 puffs BID and pt is okay with this trying this.  I have also sent the prednisone (prednisone 20mg  #5, 1 po QD till gone as stated below) to [facility] on [address] for pt to have on hold in case her symptoms do not improve with this increase.  Pt was okay with this and advised her to please call the office to let us know if her symptoms persist thru the weekend.  Will sign and forward to BQ as FYI.

## 2013-11-29 NOTE — Telephone Encounter (Signed)
Pt went to see her pcp today for cramping, her labs were drawn and her eosinophil was 13.6.  Was told that this is related to her allergies.  Wants to just make BQ aware, and wants to know if she needs an allergy skin or blood test to see what the allergy is for.  I advised pt that BQ is out of the office this afternoon, she understands that she may not get an answer today.    BQ please advise.  Thank you!

## 2013-12-01 NOTE — Telephone Encounter (Signed)
Noted.  Explain to her that this is likely related to her ABPA we have been treating for months now.  Please see how she is doing on Monday.  If no better call in 20mg  prednisone for 5 days.

## 2013-12-02 NOTE — Telephone Encounter (Signed)
I called spoke with pt. She has already made an appt with an allergists. Nothing further needed

## 2013-12-02 NOTE — Telephone Encounter (Signed)
Fine to refer to allergy Reason> suspect ABPA

## 2013-12-02 NOTE — Telephone Encounter (Signed)
Called spoke with pt. She reports she going to start the prednisone today. She is still having an allergic reaction to something and wants to fins out what it is. She feels she needs an allergy test to be done. Please advise BQ thanks

## 2013-12-05 ENCOUNTER — Ambulatory Visit (INDEPENDENT_AMBULATORY_CARE_PROVIDER_SITE_OTHER): Payer: BC Managed Care – PPO | Admitting: Adult Health

## 2013-12-05 ENCOUNTER — Encounter: Payer: Self-pay | Admitting: Adult Health

## 2013-12-05 ENCOUNTER — Other Ambulatory Visit: Payer: BC Managed Care – PPO

## 2013-12-05 ENCOUNTER — Telehealth: Payer: Self-pay | Admitting: Pulmonary Disease

## 2013-12-05 VITALS — BP 102/68 | HR 85 | Temp 98.5°F | Ht 67.5 in | Wt 161.0 lb

## 2013-12-05 DIAGNOSIS — B4489 Other forms of aspergillosis: Secondary | ICD-10-CM

## 2013-12-05 DIAGNOSIS — B449 Aspergillosis, unspecified: Secondary | ICD-10-CM

## 2013-12-05 MED ORDER — PREDNISONE 10 MG PO TABS: ORAL_TABLET | ORAL | Status: DC

## 2013-12-05 MED ORDER — AZITHROMYCIN 250 MG PO TABS: ORAL_TABLET | ORAL | Status: DC

## 2013-12-05 NOTE — Telephone Encounter (Signed)
Called spoke with pt. She is scheduled to come in and see TP this AM at 9:45. Nothing further needed

## 2013-12-05 NOTE — Patient Instructions (Signed)
Prednisone taper over next week as directed.  Mucinex DM .Twice daily  As needed  Cough/congestion  Fluids and rest  Flutter valve As needed   Chest PT as directed.  Follow up with Dr. Lake Bells as directed.  Sputum culture today.  Please contact office for sooner follow up if symptoms do not improve or worsen or seek emergency care

## 2013-12-05 NOTE — Progress Notes (Signed)
Subjective:    Patient ID: Kathryn Griffin, female    DOB: [birth year], 58 y.o.   MRN: [id number]  HPI  Synopsis: 58 yo female developed cough and thick mucus produciton for the first time in her life in 2013. Produced casts with coughing on several occassions and responded to antibiotic and prednisone therapy.  Bronchoscopy in 04/2013 showed mucus plugging/cast LUL. Bronch cultures grew aspergillus species. Baseline GERD.  Methacholine challenge 01/2013 negative.  IgE low/normal 2014. Eosinophil elevated one time in early 2014.  She was treated with itraconazole for a month and did well by January 2015.  HPI   05/31/2013 Hospital F/U> Kathryn Griffin is here to see me becaues she was recently hospitalized.  She has been coughing up grey sputum for a year now.  She saw Dr. Melvyn Novas recently.  In August she coughed up a mucus plug which was grey in color.  She saw Dr. Benson [place] in [place].  She has acid reflux which is going into her lungs.    She notes that she has had mucs in her lung sfor years.  She has been old that she had vocal cord dysfunction.   Normal childhood and normal adulthood.  Symptoms started last November when in [place] on vacation.  She started with a cold ad had lots of mucus production.  She coughed up a cast (yellow, green in color).  She was treated off and on with steroids and antiobiotics.  In [place] she had a really bad attack where she was experiencing dyspnea.  Rx prednisone and Levaquin and she got better.    Since her recent hospital discharge she feels great.  During that time she had a mucus plug removed during a bronchoscopy.  She has been taking Levaquin since then.  07/18/2013 TROV >> In the last few days Kathryn Griffin has been having an increased cough and mucus production.  She fels a rattling and noise in her chest.  Mucinex made it better.  No sputum production.  Rare sputum production.   She didn't like she that she couldn't drink a glass of wine while on the itraconazole, but for  the most part she has done great on it.  Minimal dyspnea, no albuterol or ciclesonide use.  She is having some acid reflux. >cont on itraconazole   07/30/13 Acute OV  Presents for an acute office visit. Complains of wheezing, increased SOB, prod cough with brown mucus, fever today. Has felt like she was catching a cold for last several days. Went to work today, felt worse as the day progressed. This evening coughing up really thick mucus, fever this evening.  Denies hemoptysis, body aches, vomiting.  Coughing up thick brown clumps of mucus.  Appetite is good, no n/v/d.  >>levaquin and steroid taper  Flu swab neg in office    08/02/2013 Follow up  atient returns for three-day followup. Patient was seen last visit with a severe bronchitis. Patient has underlying known pulmonary Aspergillus on intraconazole .  Chest x-ray showed no acute findings. She was treated with Levaquin 500 mg daily x7 days along with prednisone taper. Patient had difficulties with mucus plugging during her hospitalization in November. She was started on mucolytics, and recommend he use her albuterol nebulizer along with flutter valve. Patient reports that she is feeling much improved. She feels that she is 70% better. Her cough and congestion to decrease. She is now coughing up mainly yellow. Mucus. This is also decreased in thickness, and intensity. Patient denies any hemoptysis, orthopnea,  PND, leg swelling. Her fevers have resolved.  08/19/2013 ROV > Kathryn Griffin came in earlier this month because she was having more cough, dyspnea, and coughing up mucus and thick brown mucus.  She improved with Levaquin, but then in the last few days she has started coughing up brown grey mucus.  She has been using albuterol a lot which helps her clear the mucus.  She still has some cough.  She denies fevers, or chills.  She is worried that the aspergillus is back.  09/16/2013 ROV > Kathryn Griffin is doing a lot better lately. She has been taking the  prednisone regularly.  She has not coughed up any thick brown mucus in about two weeks now.  She has not had the repeated cough again.  She has clear yellow mucus right now.  She is back to exercising regularly again.    11/19/2013 ROV > Kathryn Griffin has been doing okay since the last visit. Unfortunately, she did have to receive a dose of prednisone and antibiotics back in April. Since then she's been doing OK. For some reason it sounds like she only has a 40 mcg inhaler of Qvar at home. She is just been using 1 puff twice a day. She recently had some problems with this getting blocked up which are now resolved. She says that she no longer has shortness of breath. She does continue to have some mucus production on a nearly daily basis. Probably 4-5 times since the last visit she estimates she has had a cough productive of hard brown mucus.   12/05/2013 Acute OV  Complains of increased SOB, prod cough with thick/yellow/brown mucus, rattling in chest.  Initially had low grade temp. Was called in prednisone 20mg  daily for 5 days  , On 4th day of prednisone 20mg .  temp of 100.6 on 6/6 Remains on QVAR , nebs, chest PT , flutter.  Brought sputum culture today.  Patient denies any hemoptysis, chest pain, orthopnea, PND, or leg swelling   Had recent visit with PCP , labs showed eosinophil of 13.6 .  She has been referred to allergist.    Review of Systems Constitutional:   No  weight loss, night sweats,  Fevers, chills,  +fatigue, or  lassitude.  HEENT:   No headaches,  Difficulty swallowing,  Tooth/dental problems, or  Sore throat,                No sneezing, itching, ear ache,  +nasal congestion, post nasal drip,   CV:  No chest pain,  Orthopnea, PND, swelling in lower extremities, anasarca, dizziness, palpitations, syncope.   GI  No heartburn, indigestion, abdominal pain, nausea, vomiting, diarrhea, change in bowel habits, loss of appetite, bloody stools.   Resp:    No chest wall deformity  Skin:  no rash or lesions.  GU: no dysuria, change in color of urine, no urgency or frequency.  No flank pain, no hematuria   MS:  No joint pain or swelling.  No decreased range of motion.  No back pain.  Psych:  No change in mood or affect. No depression or anxiety.  No memory loss.         Objective:   Physical Exam  GEN: A/Ox3; pleasant , NAD, well nourished , barking cough   HEENT:  [place]/AT,  EACs-clear, TMs-wnl, NOSE-clear drainage  THROAT-clear, no lesions, no postnasal drip or exudate noted.   NECK:  Supple w/ fair ROM; no JVD; normal carotid impulses w/o bruits; no thyromegaly or nodules palpated; no  lymphadenopathy.  RESP  Trace rhonchi that clears with cough, no wheezing no accessory muscle use, no dullness to percussion  CARD:  RRR, no m/r/g  , no peripheral edema, pulses intact, no cyanosis or clubbing.  GI:   Soft & nt; nml bowel sounds; no organomegaly or masses detected.  Musco: Warm bil, no deformities or joint swelling noted.   Neuro: alert, no focal deficits noted.    Skin: Warm, no lesions or rashes        Assessment & Plan:

## 2013-12-06 NOTE — Assessment & Plan Note (Signed)
On going [place]  Encouaged on pulmonary hygiene regimen  Check sputum cx  rx for zpack to have on hold if discolored mucus worsens.   Plan  Prednisone taper over next week as directed.  Mucinex DM .Twice daily  As needed  Cough/congestion  Fluids and rest  Flutter valve As needed   Chest PT as directed.  Follow up with Dr. Lake Bells as directed.  Sputum culture today.  Please contact office for sooner follow up if symptoms do not improve or worsen or seek emergency care

## 2013-12-08 LAB — RESPIRATORY CULTURE OR RESPIRATORY AND SPUTUM CULTURE

## 2013-12-09 ENCOUNTER — Telehealth: Payer: Self-pay | Admitting: Pulmonary Disease

## 2013-12-09 ENCOUNTER — Telehealth: Payer: Self-pay | Admitting: Adult Health

## 2013-12-09 MED ORDER — CIPROFLOXACIN HCL 750 MG PO TABS: 750.0000 mg | ORAL_TABLET | Freq: Two times a day (BID) | ORAL | Status: DC

## 2013-12-09 MED ORDER — BECLOMETHASONE DIPROPIONATE 80 MCG/ACT IN AERS: 2.0000 | INHALATION_SPRAY | Freq: Two times a day (BID) | RESPIRATORY_TRACT | Status: DC

## 2013-12-09 NOTE — Progress Notes (Signed)
Quick Note:  LMOM TCB x1. ______ 

## 2013-12-09 NOTE — Telephone Encounter (Signed)
Called and spoke with pt and she is aware of refill of the qvar that has been sent to the pharmacy per pts request.     Pt also requested results of the labs that were done.  She stated that jess called her but she was not able to get back in touch with her.  i have reviewed her labs results per TP and pt is aware of these results.  She is aware to start on the cipro and she has appt with BQ on 6/29.  Nothing further is needed.

## 2013-12-10 NOTE — Telephone Encounter (Signed)
Pt is aware of results per other 6.15.15 phone note.

## 2013-12-23 ENCOUNTER — Ambulatory Visit (INDEPENDENT_AMBULATORY_CARE_PROVIDER_SITE_OTHER): Payer: BC Managed Care – PPO | Admitting: Pulmonary Disease

## 2013-12-23 ENCOUNTER — Encounter: Payer: Self-pay | Admitting: Pulmonary Disease

## 2013-12-23 VITALS — BP 124/78 | HR 80 | Ht 67.5 in | Wt 162.0 lb

## 2013-12-23 DIAGNOSIS — R05 Cough: Secondary | ICD-10-CM

## 2013-12-23 DIAGNOSIS — R059 Cough, unspecified: Secondary | ICD-10-CM

## 2013-12-23 DIAGNOSIS — B4489 Other forms of aspergillosis: Secondary | ICD-10-CM

## 2013-12-23 DIAGNOSIS — B449 Aspergillosis, unspecified: Secondary | ICD-10-CM

## 2013-12-23 NOTE — Progress Notes (Signed)
Subjective:    Patient ID: Kathryn Griffin, female    DOB: 1956/01/05, 58 y.o.   MRN: [id number]  Synopsis: 58 yo female developed cough and thick mucus produciton for the first time in her life in 2013. Produced casts with coughing on several occassions and responded to antibiotic and prednisone therapy.  Bronchoscopy in 04/2013 showed mucus plugging/cast LUL. Bronch cultures grew aspergillus species. Baseline GERD.  Methacholine challenge 01/2013 negative.  IgE low/normal 2014. Eosinophil elevated one time in early 2014.  She was treated with itraconazole for a month and did well by January 2015.  HPI   12/23/2013 ROV >> Kathryn Griffin has been doing well since the last visit with Tammy. She took Cipro and a prednisone taper after her sputum culture grew out Pseudomonas. This was quite surprising. She tells me that in the last 2 weeks she feels is good as she's ever felt in the last year. In fact, she was able to walk 45 minutes this morning and she said it felt great. She has not had chest tightness, wheeze, or shortness of breath since completing a prednisone taper. She is supposed to see an allergist this week. She continues to take Qvar at 4 puffs twice a day.    Past Medical History  Diagnosis Date  . Gestational diabetes   . Reflux      Review of Systems  Constitutional: Negative for fever, chills and fatigue.  HENT: Negative for postnasal drip, rhinorrhea and sinus pressure.   Respiratory: Negative for cough, shortness of breath and wheezing.   Cardiovascular: Negative for chest pain, palpitations and leg swelling.         Objective:   Physical Exam  Filed Vitals:   12/23/13 1450  BP: 124/78  Pulse: 80  Height: 5' 7.5" (1.715 m)  Weight: 162 lb (73.483 kg)  SpO2: 100%  RA  Gen: well appearing, no acute distress HEENT: NCAT, EOMi, OP clear PULM: CTA B CV: RRR, no mgr, no JVD AB: BS+, soft, nontender, no hsm Ext: warm, no edema, no clubbing, no cyanosis  08/02/2013 CXR >  normal       Assessment & Plan:   Cough Kathryn Griffin is doing well right now. However, recently she had a Pseudomonas infection of her upper respiratory tract requiring treatment with Cipro. It's not entirely clear to me why she had this aside from all the antibiotics that she's received in the last year. She does not have clear evidence of structural lung disease on CT chest.  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 In fact, she has grown out aspergillus multiple times for respiratory infections, she has had an elevated IgE level and she often presents with wheezing and shortness of breath. Further, she had a large mucous plug/cast which was removed on bronchoscopy in November of 2014.  We have been trying to manage her with high-dose inhaled corticosteroids but this has not been that successful.  Plan: -I fully agree with her primary care physician's plan to have her be seen by an allergist this week. -I will forward this note and multiple other notes from our clinic and offer any information as needed to the allergist to try to help [place] get to the bottom of this. - Specifically, I think it would be helpful for her to have aspergillus skin testing -Continue  high-dose Qvar, but I instructed her to taper it down to a medium dose for the next 2 weeks.    Updated Medication List Outpatient Encounter Prescriptions as of 12/23/2013  Medication Sig  . albuterol (PROAIR HFA) 108 (90 BASE) MCG/ACT inhaler Inhale 2 puffs into the lungs every 6 (six) hours as needed.  Marland Kitchen albuterol (PROVENTIL) (2.5 MG/3ML) 0.083% nebulizer solution Take 3 mLs (2.5 mg total) by nebulization every 6 (six) hours as needed for wheezing or shortness of breath.  . beclomethasone (QVAR) 80 MCG/ACT inhaler Inhale 4 puffs into the  lungs 2 (two) times daily.  . Calcium Citrate (CITRACAL PO) Take 1 tablet by mouth 2 (two) times daily.  . Ciclesonide (ZETONNA) 37 MCG/ACT AERS Place 1 spray into the nose every morning.  Marland Kitchen esomeprazole (NEXIUM) 40 MG capsule Take 40 mg by mouth daily.  . Estradiol 10 MCG TABS vaginal tablet Place 1 tablet (10 mcg total) vaginally 2 (two) times a week.  . famotidine (PEPCID) 20 MG tablet Take 20 mg by mouth at bedtime.  . Multiple Vitamin (MULTIVITAMIN) capsule Take 1 capsule by mouth daily.    Marland Kitchen Spacer/Aero-Holding Chambers (AEROCHAMBER MV) inhaler Use as instructed  . [DISCONTINUED] beclomethasone (QVAR) 80 MCG/ACT inhaler Inhale 2 puffs into the lungs 2 (two) times daily.  . [DISCONTINUED] azithromycin (ZITHROMAX) 250 MG tablet Take as directed  . [DISCONTINUED] ciprofloxacin (CIPRO) 750 MG tablet Take 1 tablet (750 mg total) by mouth 2 (two) times daily.  . [DISCONTINUED] predniSONE (DELTASONE) 10 MG tablet 4 tabs for 3 days, then 3 tabs for 3 days, 2 tabs for 3 days, then 1 tab for 3 days, then stop  . [DISCONTINUED] predniSONE (DELTASONE) 20 MG tablet Take 1 tablet (20 mg total) by mouth daily with breakfast. x5 days

## 2013-12-23 NOTE — Assessment & Plan Note (Signed)
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 In fact, she has grown out aspergillus multiple times for respiratory infections, she has had an elevated IgE level and she often presents with wheezing and shortness of breath. Further, she had a large mucous plug/cast which was removed on bronchoscopy in November of 2014.  We have been trying to manage her with high-dose inhaled corticosteroids but this has not been that successful.  Plan: -I fully agree with her primary care physician's plan to have her be seen by an allergist this week. -I will forward this note and multiple other notes from our clinic and offer any information as needed to the allergist to try to help [place] get to the bottom of this. - Specifically, I think it would be helpful for her to have aspergillus skin testing -Continue high-dose Qvar, but I instructed her to taper it down to a medium dose for the next 2 weeks.

## 2013-12-23 NOTE — Assessment & Plan Note (Signed)
Kathryn Griffin is doing well right now. However, recently she had a Pseudomonas infection of her upper respiratory tract requiring treatment with Cipro. It's not entirely clear to me why she had this aside from all the antibiotics that she's received in the last year. She does not have clear evidence of structural lung disease on CT chest.  Plan: -See below

## 2013-12-23 NOTE — Patient Instructions (Signed)
Gradually taper down your QVar to 2 puffs twice a day We will send our notes to your allergist We will see you back in September as previously scheduled

## 2013-12-30 ENCOUNTER — Telehealth: Payer: Self-pay | Admitting: Pulmonary Disease

## 2013-12-30 NOTE — Telephone Encounter (Signed)
[phone] returning a call

## 2013-12-30 NOTE — Telephone Encounter (Signed)
Called spoke w/ pt. She is asking for a refill on zetonna. She reports this was refilled by her previous pulmonologist.  Please advise BQ thanks

## 2013-12-30 NOTE — Telephone Encounter (Signed)
lmomtcb for pt 

## 2013-12-31 MED ORDER — CICLESONIDE 37 MCG/ACT NA AERS: 1.0000 | INHALATION_SPRAY | Freq: Every morning | NASAL | Status: DC

## 2013-12-31 NOTE — Telephone Encounter (Signed)
RX has been sent to [facility].  LMTCB x1

## 2013-12-31 NOTE — Telephone Encounter (Signed)
Ok by me to refill

## 2014-01-01 NOTE — Telephone Encounter (Signed)
Called and lmom to make the pt aware of rx that has been sent to the pharmacy.

## 2014-01-15 ENCOUNTER — Telehealth: Payer: Self-pay | Admitting: Pulmonary Disease

## 2014-01-15 NOTE — Telephone Encounter (Signed)
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

## 2014-01-16 NOTE — Telephone Encounter (Signed)
Called spoke with pt. appt scheduled. Nothing further needed 

## 2014-01-16 NOTE — Telephone Encounter (Signed)
She may be right, but it is hard to prove Have her set up an appointment with me within the next 4-6 weeks

## 2014-01-20 ENCOUNTER — Other Ambulatory Visit: Payer: Self-pay

## 2014-01-20 MED ORDER — BECLOMETHASONE DIPROPIONATE 80 MCG/ACT IN AERS: 4.0000 | INHALATION_SPRAY | Freq: Two times a day (BID) | RESPIRATORY_TRACT | Status: DC

## 2014-02-10 ENCOUNTER — Telehealth: Payer: Self-pay | Admitting: Pulmonary Disease

## 2014-02-10 ENCOUNTER — Ambulatory Visit: Payer: BC Managed Care – PPO

## 2014-02-10 ENCOUNTER — Other Ambulatory Visit: Payer: Self-pay | Admitting: Pulmonary Disease

## 2014-02-10 DIAGNOSIS — R059 Cough, unspecified: Secondary | ICD-10-CM

## 2014-02-10 DIAGNOSIS — R05 Cough: Secondary | ICD-10-CM

## 2014-02-10 NOTE — Telephone Encounter (Signed)
Order has been placed. Nothing further needed [place] aware. Nothing further needed

## 2014-02-10 NOTE — Telephone Encounter (Signed)
Called spoke with pt. She reports she dropped off sputum sample in our [facility] lab this AM. She reports  X 2 days she was feeling congested then she coughed up a "huge amount of phlem last night". Pt reports she coughed it up in the sink then placed it in a "sterile Cup". She reports she did refrigerate this but since she coughed it up in sink not sure about the bacteria. Please advise BQ as no order is in epic? thanks

## 2014-02-10 NOTE — Telephone Encounter (Signed)
Please order sputum culture and fungal culture

## 2014-02-10 NOTE — Telephone Encounter (Signed)
[place] with lab called.  They have received sputum sample left by pt but there is no order in for this. Once msg is addressed by BQ, [place] would like a call back at ext [phone] so they know how to proceed with specimen.

## 2014-02-12 ENCOUNTER — Telehealth: Payer: Self-pay | Admitting: Pulmonary Disease

## 2014-02-12 MED ORDER — DOXYCYCLINE HYCLATE 100 MG PO TABS: 100.0000 mg | ORAL_TABLET | Freq: Two times a day (BID) | ORAL | Status: DC

## 2014-02-12 NOTE — Telephone Encounter (Signed)
lmomtcb x1 

## 2014-02-12 NOTE — Telephone Encounter (Signed)
Notes Recorded by Juanito Doom, MD on 02/12/2014 at 1:01 PM Kathryn Griffin and Triage  Her sputum culture is growing out Staph.  She needs doxycycline 100mg  po bid x7 days  Thanks B  Pt aware of results and aware that rx has been sent.

## 2014-02-13 ENCOUNTER — Ambulatory Visit: Payer: BC Managed Care – PPO | Admitting: Pulmonary Disease

## 2014-02-13 LAB — RESPIRATORY CULTURE OR RESPIRATORY AND SPUTUM CULTURE

## 2014-02-14 ENCOUNTER — Ambulatory Visit (INDEPENDENT_AMBULATORY_CARE_PROVIDER_SITE_OTHER): Payer: BC Managed Care – PPO | Admitting: Gynecology

## 2014-02-14 ENCOUNTER — Other Ambulatory Visit (HOSPITAL_COMMUNITY)
Admission: RE | Admit: 2014-02-14 | Discharge: 2014-02-14 | Disposition: A | Discharge status: Home / Self Care | Payer: BC Managed Care – PPO | Source: Ambulatory Visit | Attending: Gynecology | Admitting: Gynecology

## 2014-02-14 ENCOUNTER — Encounter: Payer: Self-pay | Admitting: Gynecology

## 2014-02-14 VITALS — BP 120/80 | Ht 67.5 in | Wt 164.0 lb

## 2014-02-14 DIAGNOSIS — Z1151 Encounter for screening for human papillomavirus (HPV): Secondary | ICD-10-CM | POA: Insufficient documentation

## 2014-02-14 DIAGNOSIS — Z01419 Encounter for gynecological examination (general) (routine) without abnormal findings: Secondary | ICD-10-CM | POA: Diagnosis not present

## 2014-02-14 DIAGNOSIS — M8580 Other specified disorders of bone density and structure, unspecified site: Secondary | ICD-10-CM

## 2014-02-14 DIAGNOSIS — M899 Disorder of bone, unspecified: Secondary | ICD-10-CM

## 2014-02-14 DIAGNOSIS — N952 Postmenopausal atrophic vaginitis: Secondary | ICD-10-CM

## 2014-02-14 DIAGNOSIS — Z79890 Hormone replacement therapy (postmenopausal): Secondary | ICD-10-CM

## 2014-02-14 DIAGNOSIS — M949 Disorder of cartilage, unspecified: Secondary | ICD-10-CM

## 2014-02-16 NOTE — Progress Notes (Signed)
Kathryn Griffin [birth year] [id number]   History:    58 y.o.  for annual gyn exam with no major complaints today. The patient has been followed by the pulmonologist due to Aspergillus Fumigatus identified on sputum culture. Patient is also menopausal but had only suffered from vaginal dryness and irritation and has done well with estradiol vaginal cream 0.02% which she applies twice weekly. She denied any vaginal bleeding. She has had sporadic left breast sided chest discomfort 2 times in the past 3 weeks. She denied any shortness of breath or tingling on her arm or neck or any numbness.   Her PCP is Dr. Inda Merlin who has been doing her blood work and her pulmonologist is Dr. Curt Jews. In 2014 patient had a colonoscopy which demonstrated benign polyps. Patient's last bone density study was in February 2014 in the lowest T score was -1.5 at the right femoral neck within normal FRAX  Analysis. Patient reports no previous history of any abnormal Pap smears.  Past medical history,surgical history, family history and social history were all reviewed and documented in the EPIC chart.  Gynecologic History none No LMP recorded. Patient is postmenopausal. Contraception: post menopausal status and tubal ligation Last Pap:  2012 . Results were: normal Last mammogram:  2015 . Results were: normal  Obstetric History OB History  Gravida Para Term Preterm AB SAB TAB Ectopic Multiple Living  4 2   2 2    2     # Outcome Date GA Lbr Len/2nd Weight Sex Delivery Anes PTL Lv  4 SAB           3 SAB           2 PAR           1 PAR                ROS: A ROS was performed and pertinent positives and negatives are included in the history.  GENERAL: No fevers or chills. HEENT: No change in vision, no earache, sore throat or sinus congestion. NECK: No pain or stiffness. CARDIOVASCULAR: No chest pain or pressure. No palpitations. PULMONARY: No shortness of breath, cough or wheeze. GASTROINTESTINAL: No abdominal pain,  nausea, vomiting or diarrhea, melena or bright red blood per rectum. GENITOURINARY: No urinary frequency, urgency, hesitancy or dysuria. MUSCULOSKELETAL: No joint or muscle pain, no back pain, no recent trauma. DERMATOLOGIC: No rash, no itching, no lesions. ENDOCRINE: No polyuria, polydipsia, no heat or cold intolerance. No recent change in weight. HEMATOLOGICAL: No anemia or easy bruising or bleeding. NEUROLOGIC: No headache, seizures, numbness, tingling or weakness. PSYCHIATRIC: No depression, no loss of interest in normal activity or change in sleep pattern.     Exam: chaperone present  BP 120/80  Ht 5' 7.5" (1.715 m)  Wt 164 lb (74.39 kg)  BMI 25.29 kg/m2  Body mass index is 25.29 kg/(m^2).  General appearance : Well developed well nourished female. No acute distress HEENT: Neck supple, trachea midline, no carotid bruits, no thyroidmegaly Lungs: Clear to auscultation, no rhonchi or wheezes, or rib retractions  Heart: Regular rate and rhythm, no murmurs or gallops Breast:Examined in sitting and supine position were symmetrical in appearance, no palpable masses or tenderness,  no skin retraction, no nipple inversion, no nipple discharge, no skin discoloration, no axillary or supraclavicular lymphadenopathy Abdomen: no palpable masses or tenderness, no rebound or guarding Extremities: no edema or skin discoloration or tenderness  Pelvic:  Bartholin, Urethra, Skene Glands: Within normal limits  Vagina: No gross lesions or discharge  Cervix: No gross lesions or discharge  Uterus   anteverted , normal size, shape and consistency, non-tender and mobile  Adnexa  Without masses or tenderness  Anus and perineum  normal   Rectovaginal  normal sphincter tone without palpated masses or tenderness             Hemoccult PCP we'll provide     Assessment/Plan:  58 y.o. female normal gynecological exam. Pap smear was done today. Patient was reminded of the importance of calcium and  vitamin D and regular exercise for osteoporosis prevention. We discussed importance of monthly self breast examination. Prescription refill for estradiol 0.02% to apply intravaginally twice a week for vaginal atrophy and dyspareunia. The PCP we'll be doing her blood work.   Note: This dictation was prepared with  Dragon/digital dictation along withSmart phrase technology. Any transcriptional errors that result from this process are unintentional.   Terrance Mass MD, 2:36 PM 02/16/2014

## 2014-02-18 LAB — CYTOLOGY - PAP

## 2014-02-25 ENCOUNTER — Encounter: Payer: Self-pay | Admitting: Pulmonary Disease

## 2014-02-25 ENCOUNTER — Other Ambulatory Visit (INDEPENDENT_AMBULATORY_CARE_PROVIDER_SITE_OTHER): Payer: BC Managed Care – PPO

## 2014-02-25 ENCOUNTER — Ambulatory Visit (HOSPITAL_COMMUNITY)
Admission: RE | Admit: 2014-02-25 | Discharge: 2014-02-25 | Disposition: A | Discharge status: Home / Self Care | Payer: BC Managed Care – PPO | Source: Ambulatory Visit | Attending: Pulmonary Disease | Admitting: Pulmonary Disease

## 2014-02-25 ENCOUNTER — Ambulatory Visit (INDEPENDENT_AMBULATORY_CARE_PROVIDER_SITE_OTHER): Payer: BC Managed Care – PPO | Admitting: Pulmonary Disease

## 2014-02-25 VITALS — BP 122/64 | HR 69 | Ht 67.5 in | Wt 167.0 lb

## 2014-02-25 DIAGNOSIS — R0602 Shortness of breath: Secondary | ICD-10-CM | POA: Diagnosis not present

## 2014-02-25 DIAGNOSIS — I2699 Other pulmonary embolism without acute cor pulmonale: Secondary | ICD-10-CM

## 2014-02-25 DIAGNOSIS — R071 Chest pain on breathing: Secondary | ICD-10-CM

## 2014-02-25 DIAGNOSIS — R05 Cough: Secondary | ICD-10-CM

## 2014-02-25 DIAGNOSIS — R059 Cough, unspecified: Secondary | ICD-10-CM

## 2014-02-25 DIAGNOSIS — R079 Chest pain, unspecified: Secondary | ICD-10-CM | POA: Insufficient documentation

## 2014-02-25 DIAGNOSIS — B4489 Other forms of aspergillosis: Secondary | ICD-10-CM

## 2014-02-25 DIAGNOSIS — B449 Aspergillosis, unspecified: Secondary | ICD-10-CM

## 2014-02-25 MED ORDER — IOHEXOL 350 MG/ML SOLN
100.0000 mL | Freq: Once | INTRAVENOUS | Status: AC | PRN
  Administered 2014-02-25: 100 mL via INTRAVENOUS
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
## 2014-02-25 NOTE — Patient Instructions (Signed)
We will call you with the results of the CT angiogram of your chest Look up Dupilumab (study drug) and let us know if you are interested in the study When your scratchy throat is worse, use chlortrimeton-phenylephrine tablets Use the tessalon as needed for cough Keep using the QVar as you are doing, if you have increased chest tightness or wheezing, you can increase to as much as four puffs twice per day We will see you back in 3 months or sooner if needed

## 2014-02-25 NOTE — Assessment & Plan Note (Signed)
She continues to grow out aspergillus species from her sputum.  This seems to be limited to her airways.  At this point because her symptoms have been managed on inhaled steroids alone and her IgE level has been low, I think the best approach is to continue treating with inhaled steroids for an asthma like syndrome (though MCT negative???).    I wonder if she has not developed some mild bronchiectasis from the mucus plugging earlier this year.  She grew out staph from her sputum which would suggest her airway defense is limited to some degree.  Plan: -continue QVar, may titrate up to 4 puffs bid if she has increased chest tightness and wheezing

## 2014-02-25 NOTE — Assessment & Plan Note (Signed)
She has experienced several episodes of sharp chest pain which "stop her in her tracks" since returning from [place]. I suspect this may be due to mucus plugging, but we need to check for pulmonary emboli.    Plan: -CT chest now to look for PE

## 2014-02-25 NOTE — Assessment & Plan Note (Signed)
Continue treatment for allergic rhinitis.  Currently minimal GERD symptoms.  If cough worsens add back PPI.

## 2014-02-26 ENCOUNTER — Telehealth: Payer: Self-pay | Admitting: Pulmonary Disease

## 2014-02-26 LAB — BASIC METABOLIC PANEL
BUN: 11 mg/dL (ref 6–23)
CO2: 31 mEq/L (ref 19–32)
Calcium: 9.5 mg/dL (ref 8.4–10.5)
Chloride: 103 mEq/L (ref 96–112)
Creatinine, Ser: 0.8 mg/dL (ref 0.4–1.2)
GFR: 82.3 mL/min (ref >60.00)
Glucose, Bld: 98 mg/dL (ref 70–99)
Potassium: 4.7 mEq/L (ref 3.5–5.1)
Sodium: 140 mEq/L (ref 135–145)

## 2014-02-26 MED ORDER — BENZONATATE 100 MG PO CAPS: 100.0000 mg | ORAL_CAPSULE | Freq: Three times a day (TID) | ORAL | Status: DC | PRN

## 2014-02-26 NOTE — Telephone Encounter (Signed)
According to note yesterday, tessalon pearls were to be called in. Nothing in epic has been sent. Is this tess pearls 100 mg or 200 mg? How many times a day? Quantity? Refills? Please advise BQ thanks

## 2014-02-26 NOTE — Telephone Encounter (Signed)
Result Note     A,    Please let her know that this did not show a pulmonary embolism    Have her let me know when we can call to talk about the CT a little more    Thanks    B   ---  Called spoke with pt. Aware of results. She reports she can be reached anytime after 4 PM at the # above.  Please advise thanks

## 2014-02-26 NOTE — Telephone Encounter (Signed)
Pt returned call.  Advised Rx was sent to [facility] on [address].  Pt verbalized understanding & states nothing further needed at this time for this issue.  Kathryn Griffin

## 2014-02-26 NOTE — Telephone Encounter (Signed)
Pt is aware of rx that was sent in to the pharmacy .

## 2014-02-26 NOTE — Progress Notes (Signed)
Quick Note:  lmtcb X1 to relay results. ______ 

## 2014-02-26 NOTE — Telephone Encounter (Signed)
lmtcb x1 for pt RX called in.

## 2014-02-26 NOTE — Telephone Encounter (Signed)
100mg  po q8h prn cough Dispense # 30 refill #1

## 2014-03-04 LAB — FUNGUS CULTURE W SMEAR: Smear Result: NONE SEEN

## 2014-03-07 NOTE — Telephone Encounter (Signed)
Yes, documented that I spoke with pt and she is aware of results.  Nothing further needed.

## 2014-03-07 NOTE — Telephone Encounter (Signed)
Caryl Pina, do you know if this has been taken care of?

## 2014-03-13 ENCOUNTER — Other Ambulatory Visit: Payer: Self-pay

## 2014-03-13 MED ORDER — ESTRADIOL 10 MCG VA TABS: 1.0000 | ORAL_TABLET | VAGINAL | Status: DC

## 2014-03-14 ENCOUNTER — Other Ambulatory Visit: Payer: Self-pay | Admitting: Gynecology

## 2014-04-28 ENCOUNTER — Encounter: Payer: Self-pay | Admitting: Pulmonary Disease

## 2014-05-06 ENCOUNTER — Other Ambulatory Visit: Payer: Self-pay

## 2014-05-06 DIAGNOSIS — R059 Cough, unspecified: Secondary | ICD-10-CM

## 2014-05-06 DIAGNOSIS — J9819 Other pulmonary collapse: Secondary | ICD-10-CM

## 2014-05-06 DIAGNOSIS — R05 Cough: Secondary | ICD-10-CM

## 2014-05-06 MED ORDER — ALBUTEROL SULFATE (2.5 MG/3ML) 0.083% IN NEBU: 2.5000 mg | INHALATION_SOLUTION | Freq: Four times a day (QID) | RESPIRATORY_TRACT | Status: DC | PRN

## 2014-05-29 ENCOUNTER — Ambulatory Visit (INDEPENDENT_AMBULATORY_CARE_PROVIDER_SITE_OTHER): Payer: BC Managed Care – PPO | Admitting: Pulmonary Disease

## 2014-05-29 ENCOUNTER — Encounter: Payer: Self-pay | Admitting: Pulmonary Disease

## 2014-05-29 VITALS — BP 114/68 | HR 77 | Ht 67.5 in | Wt 168.0 lb

## 2014-05-29 DIAGNOSIS — B4489 Other forms of aspergillosis: Secondary | ICD-10-CM

## 2014-05-29 NOTE — Progress Notes (Signed)
Subjective:    Patient ID: Kathryn Griffin, female    DOB: 1956/05/29, 58 y.o.   MRN: [id number]  Synopsis: 58 yo female developed cough and thick mucus produciton for the first time in her life in 2013. Produced casts with coughing on several occassions and responded to antibiotic and prednisone therapy.  Bronchoscopy in 04/2013 showed mucus plugging/cast LUL. Bronch cultures grew aspergillus species. Baseline GERD.  Methacholine challenge 01/2013 negative.  IgE low/normal 2014. Eosinophil elevated one time in early 2014.  She was treated with itraconazole for a month and did well by January 2015.  HPI  Chief Complaint  Patient presents with  . Follow-up    pt states she is gradually getting better and better.  Last prod cough was on 11/7.  No complaints at this time.     05/29/2014 ROV > Kathryn Griffin says that she has been doing well lately.  She has not coughed up anything "gross" since May 03, 2014.  No problems with shortness of breath, she sometimes notices a little when she climbs staris.  Can walk 40 minutes wihtout stopping.  On whezing or chest tightness.  She does albuterol daily because it helps.  She is down to QVar 2 puffs twice a day, is compliant with nasal steroid.    Past Medical History  Diagnosis Date  . Gestational diabetes   . Reflux   . Aspergillus fumigatus 11/14     Review of Systems  Constitutional: Negative for fever, chills and fatigue.  HENT: Negative for postnasal drip, rhinorrhea and sinus pressure.   Respiratory: Negative for cough, shortness of breath and wheezing.   Cardiovascular: Negative for chest pain, palpitations and leg swelling.         Objective:   Physical Exam  Filed Vitals:   05/29/14 1354  BP: 114/68  Pulse: 77  Height: 5' 7.5" (1.715 m)  Weight: 168 lb (76.204 kg)  SpO2: 100%  RA  Gen: well appearing, no acute distress HEENT: NCAT, EOMi, OP clear PULM: CTA B CV: RRR, no mgr, no JVD AB: BS+, soft, nontender, no hsm Ext:  warm, no edema, no clubbing, no cyanosis  08/02/2013 CXR > normal   04/2013 bronch > large left upper lobe mucus plug/cast causing left upper lobe collapse 04/2013 bronch culture> aspergillus species 05/2013 IgE 156 05/2013 Apergillus precipitans > low positive 07/2013 sputum culture > negative 08/2013 sputum culture > aspergillus sp 01/2014 sputum culture > aspergillus sp    Assessment & Plan:   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 She is doing well with inhaled corticosteroids alone. Though she has had repeated cultures which grow Aspergillus, she has done best with treatment as if this is an ABPA-like syndrome by focusing our care on inhaled steroids and as needed prednisone. I recently discussed her case with some national experts in Aspergillus lung disease while I was at a conference in [place], and they advised that we continue therapy as we are doing at this time with Qvar. Fortunately, she seems to be doing fairly well with this treatment.  Plan: -Continue Qvar 2 puffs twice a day, may titrate up to 4 puffs twice a day if symptoms increase -I see no role for further surveillance cultures at this time -If she has significant increase in symptoms would treat with prednisone taper first -If refractory symptoms or she requires multiple prednisone tapers, then would consider use of posaconazole -f/u 4 months    Updated Medication List  Outpatient Encounter Prescriptions as of 05/29/2014  Medication Sig  . albuterol (PROAIR HFA) 108 (90 BASE) MCG/ACT inhaler Inhale 2 puffs into the lungs every 6 (six) hours as needed.  Marland Kitchen albuterol (PROVENTIL) (2.5 MG/3ML) 0.083% nebulizer solution Take 3 mLs (2.5 mg total) by nebulization every 6 (six) hours as needed for wheezing or shortness of breath.  . beclomethasone (QVAR) 80 MCG/ACT inhaler Inhale 4 puffs into the lungs 2 (two) times daily. (Patient taking differently: Inhale 2 puffs into the lungs 2  (two) times daily. )  . benzonatate (TESSALON) 100 MG capsule Take 1 capsule (100 mg total) by mouth every 8 (eight) hours as needed for cough.  . Calcium Citrate (CITRACAL PO) Take 1 tablet by mouth 2 (two) times daily.  Marland Kitchen dextromethorphan-guaiFENesin (MUCINEX DM) 30-600 MG per 12 hr tablet Take 2 tablets by mouth 2 (two) times daily.  Marland Kitchen esomeprazole (NEXIUM) 40 MG capsule Take 40 mg by mouth daily.  . Estradiol 10 MCG TABS vaginal tablet Place 1 tablet (10 mcg total) vaginally 2 (two) times a week.  . famotidine (PEPCID) 20 MG tablet Take 20 mg by mouth at bedtime.  . fluticasone (FLONASE) 50 MCG/ACT nasal spray Place 1 spray into both nostrils daily.  . Multiple Vitamin (MULTIVITAMIN) capsule Take 1 capsule by mouth daily.    . pseudoephedrine (SUDAFED) 30 MG tablet Take 30 mg by mouth every 4 (four) hours as needed for congestion.  Marland Kitchen Spacer/Aero-Holding Chambers (AEROCHAMBER MV) inhaler Use as instructed  . fexofenadine (ALLEGRA) 180 MG tablet Take 180 mg by mouth daily.  . [DISCONTINUED] Ciclesonide (ZETONNA) 37 MCG/ACT AERS Place 1 spray into the nose every morning. (Patient not taking: Reported on 05/29/2014)

## 2014-05-29 NOTE — Assessment & Plan Note (Signed)
This has been a stable interval for [facility]. She is doing well with inhaled corticosteroids alone. Though she has had repeated cultures which grow Aspergillus, she has done best with treatment as if this is an ABPA-like syndrome by focusing our care on inhaled steroids and as needed prednisone. I recently discussed her case with some national experts in Aspergillus lung disease while I was at a conference in [place], and they advised that we continue therapy as we are doing at this time with Qvar. Fortunately, she seems to be doing fairly well with this treatment.  Plan: -Continue Qvar 2 puffs twice a day, may titrate up to 4 puffs twice a day if symptoms increase -I see no role for further surveillance cultures at this time -If she has significant increase in symptoms would treat with prednisone taper first -If refractory symptoms or she requires multiple prednisone tapers, then would consider use of posaconazole -f/u 4 months

## 2014-05-29 NOTE — Patient Instructions (Signed)
We will see you back in 4 months or sooner if needed

## 2014-06-19 ENCOUNTER — Telehealth: Payer: Self-pay | Admitting: Pulmonary Disease

## 2014-06-19 MED ORDER — PREDNISONE 10 MG PO TABS: ORAL_TABLET | ORAL | Status: DC

## 2014-06-19 MED ORDER — DOXYCYCLINE HYCLATE 100 MG PO TABS: 100.0000 mg | ORAL_TABLET | Freq: Two times a day (BID) | ORAL | Status: DC

## 2014-06-19 NOTE — Telephone Encounter (Signed)
Called and spoke to pt. Pt c/o chest congestion, cough with little mucus production- yellow in color and increase in SOB x 5 days. Pt denies CP/tightness and f/c/s. Pt taking mucinex 1200mg  BID, allegra and flonase OTC. Pt last seen on 05/29/14 by BQ.   BQ please advise.   Allergies  Allergen Reactions  . Protonix [Pantoprazole Sodium]

## 2014-06-19 NOTE — Telephone Encounter (Signed)
Pt is aware of BQ's recs. Rx's have been sent in. Nothing further is needed.

## 2014-06-19 NOTE — Telephone Encounter (Signed)
Doxycycline 100mg  po bid x5 days Prednisone taper: Take 40mg  po daily for 3 days, then take 30mg  po daily for 3 days, then take 20mg  po daily for two days, then take 10mg  po daily for 2 days

## 2014-06-30 ENCOUNTER — Telehealth: Payer: Self-pay | Admitting: Pulmonary Disease

## 2014-06-30 MED ORDER — PREDNISONE 10 MG PO TABS: ORAL_TABLET | ORAL | Status: DC

## 2014-06-30 NOTE — Telephone Encounter (Signed)
Pt advised and rx sent. Jennifer Castillo, CMA  

## 2014-06-30 NOTE — Telephone Encounter (Signed)
Extend prednisone: 10mg  daily for 5 days, then 5mg  daily for 5 days

## 2014-06-30 NOTE — Telephone Encounter (Signed)
Spoke with pt.  She finished Doxy 2 days ago and has 2 pills of Prednisone left.  Pt reports that she coughed up 2 brown colored mucus plugs last night.  Sob is some better since clearing the plugs and hasnt been coughing much other than that.  Still has come chest congestion.  Denies fever.  Pt wasn't sure if she should take more abx or extend Prednisone.  Please advise.

## 2014-07-08 ENCOUNTER — Telehealth: Payer: Self-pay | Admitting: Pulmonary Disease

## 2014-07-08 MED ORDER — PREDNISONE 10 MG PO TABS: ORAL_TABLET | ORAL | Status: DC

## 2014-07-08 NOTE — Telephone Encounter (Signed)
Pt returning call.Kathryn Griffin ° °

## 2014-07-08 NOTE — Telephone Encounter (Signed)
Per SN- OK to continue with pred 10mg  for now Increase Qvar to 4 puffs BID while symptoms are still present Add mucinex 600mg  1 tab, PO QID  Increase fluid intake Make f/u appt with BQ.   lmtcb for pt.

## 2014-07-08 NOTE — Telephone Encounter (Signed)
Spoke with pt. Reports chest heaviness, slight SOB. States, "feels like there is something moving in my chest." Offered appointment today with Dr. Melvyn Novas and she declined appointment with him. She is currently on Prednisone taper and today she is to decreased to 5mg  but states she is going to continue taking 10mg .  SN - please advise as BQ is off due to working 11pm [employer].

## 2014-07-08 NOTE — Telephone Encounter (Signed)
Pt aware of rec's per SN  Does not want to scheduled OV with BQ at this time.  Will call back if she is not improving later this week.  Pt needing refill of prednisone 10mg  tablets. Please advise SN what quantity of 10mg  tablets you wish to call in and if the patient will need to taper back down to 5mg  dose(pt is currently on 5mg ) Thanks.

## 2014-07-08 NOTE — Telephone Encounter (Signed)
lmtcb for pt.  

## 2014-07-08 NOTE — Telephone Encounter (Signed)
Per SN: Advise pt to take pred 10mg  x 5 days then taper down to 5mg  daily.  pred taper called into pharmacy #[id number]  Nothing further needed.

## 2014-07-10 ENCOUNTER — Telehealth: Payer: Self-pay | Admitting: Pulmonary Disease

## 2014-07-10 NOTE — Telephone Encounter (Signed)
Spoke with Dr. Orvil Feil to make sure this wasn't an urgent issue as BQ is working 11pm [employer] this week.  She states it is nothing urgent, just wants to discuss the mutual patient.  The number listed is her personal cell phone number.    Dr. Lake Bells please call when available.  Thanks!

## 2014-07-11 NOTE — Telephone Encounter (Signed)
I am working all night tonight (until 7AM).   Please call me at 1:30pm on 1/15 to remind me to call Dr. Orvil Feil My cell is [phone]

## 2014-07-11 NOTE — Telephone Encounter (Signed)
Dr. Lake Bells called and given # for Dr. Remus Blake. He will call her.

## 2014-07-14 NOTE — Telephone Encounter (Signed)
BQ, can we close this message? Thanks.

## 2014-07-14 NOTE — Telephone Encounter (Signed)
yes

## 2014-08-29 ENCOUNTER — Telehealth: Payer: Self-pay | Admitting: Pulmonary Disease

## 2014-08-29 MED ORDER — PREDNISONE 10 MG PO TABS: ORAL_TABLET | ORAL | Status: DC

## 2014-08-29 NOTE — Telephone Encounter (Signed)
Prednisone taper: Take 40mg  po daily for 3 days, then take 30mg  po daily for 3 days, then take 20mg  po daily for two days, then take 10mg  po daily for 2 days Needs OV with me or TP, I'm already overbooked I think next week so most likely TP

## 2014-08-29 NOTE — Telephone Encounter (Signed)
Called and spoke to pt. Informed her of the recs per BQ. Rx sent to preferred pharmacy. Appt made with BQ on 3/10. Pt verbalized understanding and denied any further questions or concerns at this time.

## 2014-08-29 NOTE — Telephone Encounter (Signed)
Called and spoke to pt. Pt stated at work she is being exposed to mold from ceiling tiles d/t a school project. Pt stated the tiles have been thrown away. Pt is now c/o prod cough with brown mucus and chest congestion, increase use in her albuterol, flutter valve, and is taking more mucinex. Pt states s/s presented 3 days ago. Pt denies CP/tightness, f/c/s, SOB. Pt stated this is not the worst she has been but she is concerned about the exposure in relation to her s/s. Pt stated the s/s have slightly worsened since initial presentation. Pt requesting recs.   BQ please advise.   Allergies  Allergen Reactions  . Protonix [Pantoprazole Sodium]

## 2014-09-04 ENCOUNTER — Ambulatory Visit (INDEPENDENT_AMBULATORY_CARE_PROVIDER_SITE_OTHER): Payer: 59 | Admitting: Pulmonary Disease

## 2014-09-04 ENCOUNTER — Encounter: Payer: Self-pay | Admitting: Pulmonary Disease

## 2014-09-04 VITALS — BP 110/68 | HR 98 | Ht 67.5 in | Wt 165.0 lb

## 2014-09-04 DIAGNOSIS — R059 Cough, unspecified: Secondary | ICD-10-CM

## 2014-09-04 DIAGNOSIS — J309 Allergic rhinitis, unspecified: Secondary | ICD-10-CM | POA: Insufficient documentation

## 2014-09-04 DIAGNOSIS — J302 Other seasonal allergic rhinitis: Secondary | ICD-10-CM

## 2014-09-04 DIAGNOSIS — B4489 Other forms of aspergillosis: Secondary | ICD-10-CM

## 2014-09-04 DIAGNOSIS — R05 Cough: Secondary | ICD-10-CM

## 2014-09-04 NOTE — Assessment & Plan Note (Signed)
I advised that she continue taking the Flonase, but I asked her to increase it to 2 puffs each nares twice a day. I also advised that she continue using Zyrtec. Saline rinses were encouraged.

## 2014-09-04 NOTE — Patient Instructions (Signed)
Take the QVar 4 puffs twice a day no matter how you feel, stay at that dose for a month, then decrease to 3 puffs twice a day for a month, then decrease to 2 puffs twice a day for a month We will refer you to Dr. Su Hoff at [facility] for your aspergillus lung disease We will see you back in 3 months or sooner if needed

## 2014-09-04 NOTE — Assessment & Plan Note (Signed)
Unfortunately Kathryn Griffin had another flare of her chronic bronchitis/asthma type symptoms which are related to ongoing culture positive aspergillus bronchitis. This most recent episode was not associated with fevers, chills, or evidence of a viral upper respiratory infection. She is asking whether or not her symptoms could be related to some mold which was discovered on some student's art work in her school. Because the art work in question was just introduced in the last few weeks and it seems like a very small amount, I do not think that it is the cause of her symptoms but I suppose it could possibly contribute to an allergic type reaction.  I wonder whether or not there is some mold in the home. She did have significant water damage discovered last fall in her home was remediated. They have not had their air ducts checked.  Kathryn Griffin has been a very pleasant patient to work with but she has a difficult problem. She has chronic bronchitis with culture proven Aspergillus which acts like allergic bronchopulmonary aspergillosis but strangely she does not have asthma as she had a negative methacholine challenge test. I have discussed her case with Dr. Remus Blake with allergy and we both feel that it may be appropriate for her to be evaluated by a mold specialist.  Plan: -We will continue to treat her as if she has ABPA with high-dose inhaled corticosteroids and as needed prednisone when flares occur -Finished prednisone taper, increase Qvar 4 puffs twice a day for a month, then 3 puffs twice a day, then down to baseline 2 puffs twice a day -I advised that she have her home inspected for mold -Follow-up in 3 months

## 2014-09-04 NOTE — Progress Notes (Signed)
Subjective:    Patient ID: Kathryn Griffin, female    DOB: [birth year], 59 y.o.   MRN: [id number]  Synopsis: 59 yo female developed cough and thick mucus produciton for the first time in her life in 2013. Produced casts with coughing on several occassions and responded to antibiotic and prednisone therapy.  Bronchoscopy in 04/2013 showed mucus plugging/cast LUL. Bronch cultures grew aspergillus species. Baseline GERD.  Methacholine challenge 01/2013 negative.  IgE low/normal 2014. Eosinophil elevated one time in early 2014.  She was treated with itraconazole for a month and did well by January 2015.  HPI Chief Complaint  Patient presents with  . Acute Visit    Pt had black mold growing behind tiles in school. Pt feeling better since beginning prednisone.   having a prod cough with tan colored mucus.      09/04/2014 ROV> She has had increased mucus and dyspnea lately and we started her on prednisone last week.  She said that the symptoms started on 3/4 and she was more dyspneic, she was coughing up brown mucus.  She felt well on Saturday and Sunday but then on Monday she started coughing again and coughed up a chunk of mucus again which made her feel better.  But she continued to have the cough and dyspnea. She was taking QVar 2 puffs bid until this.    Had water damage in dining room in August which has been remediated.  This affected her wall and carpet, she did not stay in the house at the time.   She has had increasing sinus congestion recently which is worse in the evenings. She is only taking Flonase 1 puff each narrative night.  Past Medical History  Diagnosis Date  . Gestational diabetes   . Reflux   . Aspergillus fumigatus 11/14     Review of Systems  Constitutional: Negative for fever, chills and fatigue.  HENT: Positive for postnasal drip, rhinorrhea and sinus pressure.   Respiratory: Positive for cough and shortness of breath. Negative for wheezing.   Cardiovascular: Negative  for chest pain, palpitations and leg swelling.         Objective:   Physical Exam Filed Vitals:   09/04/14 1553  BP: 110/68  Pulse: 98  Height: 5' 7.5" (1.715 m)  Weight: 165 lb (74.844 kg)  SpO2: 96%  RA  Gen: well appearing, no acute distress HEENT: NCAT, EOMi, OP clear PULM: CTA B CV: RRR, no mgr, no JVD AB: BS+, soft, nontender, no hsm Ext: warm, no edema, no clubbing, no cyanosis  08/02/2013 CXR > normal   04/2013 bronch > large left upper lobe mucus plug/cast causing left upper lobe collapse 04/2013 bronch culture> aspergillus species 05/2013 IgE 156 05/2013 Apergillus precipitans > low positive 07/2013 sputum culture > negative 08/2013 sputum culture > aspergillus sp 01/2014 sputum culture > aspergillus sp    Assessment & Plan:   Infection due to aspergillus fumigatus Unfortunately Kathryn Griffin had another flare of her chronic bronchitis/asthma type symptoms which are related to ongoing culture positive aspergillus bronchitis. This most recent episode was not associated with fevers, chills, or evidence of a viral upper respiratory infection. She is asking whether or not her symptoms could be related to some mold which was discovered on some student's art work in her school. Because the art work in question was just introduced in the last few weeks and it seems like a very small amount, I do not think that it is the cause of her symptoms  but I suppose it could possibly contribute to an allergic type reaction.  I wonder whether or not there is some mold in the home. She did have significant water damage discovered last fall in her home was remediated. They have not had their air ducts checked.  Kathryn Griffin has been a very pleasant patient to work with but she has a difficult problem. She has chronic bronchitis with culture proven Aspergillus which acts like allergic bronchopulmonary aspergillosis but strangely she does not have asthma as she had a negative methacholine challenge test. I  have discussed her case with Dr. Remus Blake with allergy and we both feel that it may be appropriate for her to be evaluated by a mold specialist.  Plan: -We will continue to treat her as if she has ABPA with high-dose inhaled corticosteroids and as needed prednisone when flares occur -Finished prednisone taper, increase Qvar 4 puffs twice a day for a month, then 3 puffs twice a day, then down to baseline 2 puffs twice a day -I advised that she have her home inspected for mold -Follow-up in 3 months   Allergic rhinitis I advised that she continue taking the Flonase, but I asked her to increase it to 2 puffs each nares twice a day. I also advised that she continue using Zyrtec. Saline rinses were encouraged.     Updated Medication List Outpatient Encounter Prescriptions as of 09/04/2014  Medication Sig  . albuterol (PROAIR HFA) 108 (90 BASE) MCG/ACT inhaler Inhale 2 puffs into the lungs every 6 (six) hours as needed.  Marland Kitchen albuterol (PROVENTIL) (2.5 MG/3ML) 0.083% nebulizer solution Take 3 mLs (2.5 mg total) by nebulization every 6 (six) hours as needed for wheezing or shortness of breath.  . beclomethasone (QVAR) 80 MCG/ACT inhaler Inhale 4 puffs into the lungs 2 (two) times daily. (Patient taking differently: Inhale 2 puffs into the lungs 2 (two) times daily. )  . Calcium Citrate (CITRACAL PO) Take 1 tablet by mouth 2 (two) times daily.  Marland Kitchen dextromethorphan-guaiFENesin (MUCINEX DM) 30-600 MG per 12 hr tablet Take 2 tablets by mouth 2 (two) times daily.  Marland Kitchen esomeprazole (NEXIUM) 40 MG capsule Take 40 mg by mouth daily.  . Estradiol 10 MCG TABS vaginal tablet Place 1 tablet (10 mcg total) vaginally 2 (two) times a week.  . famotidine (PEPCID) 20 MG tablet Take 20 mg by mouth at bedtime.  . fexofenadine (ALLEGRA) 180 MG tablet Take 180 mg by mouth daily.  . fluticasone (FLONASE) 50 MCG/ACT nasal spray Place 1 spray into both nostrils daily.  . Multiple Vitamin (MULTIVITAMIN) capsule Take 1 capsule by  mouth daily.    . predniSONE (DELTASONE) 10 MG tablet Take 40mg  for 3 days, then 30mg  for 3 days, then 20mg  for 2 days, then 10mg  for 2 days, then stop.  Marland Kitchen Spacer/Aero-Holding Chambers (AEROCHAMBER MV) inhaler Use as instructed  . [DISCONTINUED] benzonatate (TESSALON) 100 MG capsule Take 1 capsule (100 mg total) by mouth every 8 (eight) hours as needed for cough. (Patient not taking: Reported on 09/04/2014)  . [DISCONTINUED] doxycycline (VIBRA-TABS) 100 MG tablet Take 1 tablet (100 mg total) by mouth 2 (two) times daily.  . [DISCONTINUED] predniSONE (DELTASONE) 10 MG tablet Take 10 mg daily x 5 days, then 5mg  daily until complete.  . [DISCONTINUED] pseudoephedrine (SUDAFED) 30 MG tablet Take 30 mg by mouth every 4 (four) hours as needed for congestion.

## 2014-09-08 ENCOUNTER — Other Ambulatory Visit: Payer: Self-pay

## 2014-09-08 DIAGNOSIS — Z1231 Encounter for screening mammogram for malignant neoplasm of breast: Secondary | ICD-10-CM

## 2014-09-18 ENCOUNTER — Ambulatory Visit
Admission: RE | Admit: 2014-09-18 | Discharge: 2014-09-18 | Disposition: A | Discharge status: Home / Self Care | Payer: 59 | Source: Ambulatory Visit

## 2014-09-18 DIAGNOSIS — Z1231 Encounter for screening mammogram for malignant neoplasm of breast: Secondary | ICD-10-CM

## 2014-09-22 ENCOUNTER — Ambulatory Visit (INDEPENDENT_AMBULATORY_CARE_PROVIDER_SITE_OTHER): Payer: 59 | Admitting: Pulmonary Disease

## 2014-09-22 ENCOUNTER — Encounter: Payer: Self-pay | Admitting: Pulmonary Disease

## 2014-09-22 VITALS — BP 106/62 | HR 88 | Temp 97.0°F | Ht 67.5 in | Wt 163.4 lb

## 2014-09-22 DIAGNOSIS — R05 Cough: Secondary | ICD-10-CM

## 2014-09-22 DIAGNOSIS — R0600 Dyspnea, unspecified: Secondary | ICD-10-CM | POA: Diagnosis not present

## 2014-09-22 DIAGNOSIS — J209 Acute bronchitis, unspecified: Secondary | ICD-10-CM | POA: Diagnosis not present

## 2014-09-22 DIAGNOSIS — R059 Cough, unspecified: Secondary | ICD-10-CM

## 2014-09-22 MED ORDER — PREDNISONE 10 MG PO TABS: ORAL_TABLET | ORAL | Status: DC

## 2014-09-22 MED ORDER — LEVOFLOXACIN 750 MG PO TABS: 750.0000 mg | ORAL_TABLET | Freq: Every day | ORAL | Status: DC

## 2014-09-22 NOTE — Progress Notes (Signed)
   Subjective:    Patient ID: Kathryn Griffin, female    DOB: September 24, 1955, 59 y.o.   MRN: [id number]  HPI The patient comes in today for an acute sick visit. She has a history of recurrent aspergillus infection with airway plugging, has never been found to have asthma by spirometry or methacholine challenge. She is currently being treated with Qvar, and as needed prednisone. She comes in today with the development of a tickle in her throat, followed by severe chest congestion and cough productive of purulent mucus that is different from her chronic process. Had some increased shortness of breath over her baseline. She has significant sinus congestion and pressure, but the material from her nose is totally clear. He has not had any fevers, chills, or sweats.   Review of Systems  Constitutional: Negative for fever and unexpected weight change.  HENT: Positive for congestion, postnasal drip, rhinorrhea and sinus pressure. Negative for dental problem, ear pain, nosebleeds, sneezing, sore throat and trouble swallowing.   Eyes: Negative for redness and itching.  Respiratory: Positive for cough. Negative for chest tightness, shortness of breath and wheezing.   Cardiovascular: Negative for palpitations and leg swelling.  Gastrointestinal: Negative for nausea and vomiting.  Genitourinary: Negative for dysuria.  Musculoskeletal: Negative for joint swelling.  Skin: Negative for rash.  Neurological: Negative for headaches.  Hematological: Does not bruise/bleed easily.  Psychiatric/Behavioral: Negative for dysphoric mood. The patient is not nervous/anxious.        Objective:   Physical Exam Well-developed female in no acute distress Nose without purulence or discharge noted Neck without lymphadenopathy or thyromegaly Oropharynx clear Chest with diffuse rhonchi and wheezes, but adequate airflow. Cardiac exam with regular rate and rhythm Lower extremities without edema, no cyanosis Alert and  oriented, moves all 4 extremities.       Assessment & Plan:

## 2014-09-22 NOTE — Patient Instructions (Signed)
Will treat with levaquin 750mg  one a day for 5 days Prednisone taper over 8 days. Keep followup with Dr. Lake Bells, but let us know if you are not improving.

## 2014-09-22 NOTE — Assessment & Plan Note (Addendum)
The patient is having increasing chest congestion with cough productive of purulent mucus, and is also having sinus pressure and congestion but the mucus is nonpurulent. Feels this is different from the normal mucus that she coughs up associated with her Aspergillus colonization. She is having significant rhonchi and wheezing on exam today, but surprisingly her spirometry is totally normal. I will go ahead and treat her with a course of antibiotics and prednisone for her current issues, and she is to call if she does not improve.

## 2014-10-07 ENCOUNTER — Telehealth: Payer: Self-pay | Admitting: *Deleted

## 2014-10-07 MED ORDER — ESTRADIOL 10 MCG VA TABS: 1.0000 | ORAL_TABLET | VAGINAL | Status: DC

## 2014-10-07 NOTE — Telephone Encounter (Signed)
Pt called requesting Rx for vagifem 10 mg be sent to new pharmacy, no longer has mail order, Rx sent with 1 refill

## 2014-12-04 ENCOUNTER — Emergency Department (HOSPITAL_COMMUNITY)
Admission: EM | Admit: 2014-12-04 | Discharge: 2014-12-04 | Disposition: A | Discharge status: Home / Self Care | Payer: 59 | Attending: Emergency Medicine | Admitting: Emergency Medicine

## 2014-12-04 ENCOUNTER — Encounter (HOSPITAL_COMMUNITY): Payer: Self-pay | Admitting: Emergency Medicine

## 2014-12-04 ENCOUNTER — Emergency Department (HOSPITAL_COMMUNITY): Payer: 59

## 2014-12-04 DIAGNOSIS — Y939 Activity, unspecified: Secondary | ICD-10-CM | POA: Insufficient documentation

## 2014-12-04 DIAGNOSIS — Z8619 Personal history of other infectious and parasitic diseases: Secondary | ICD-10-CM | POA: Diagnosis not present

## 2014-12-04 DIAGNOSIS — K219 Gastro-esophageal reflux disease without esophagitis: Secondary | ICD-10-CM | POA: Insufficient documentation

## 2014-12-04 DIAGNOSIS — F419 Anxiety disorder, unspecified: Secondary | ICD-10-CM | POA: Diagnosis not present

## 2014-12-04 DIAGNOSIS — X58XXXA Exposure to other specified factors, initial encounter: Secondary | ICD-10-CM | POA: Diagnosis not present

## 2014-12-04 DIAGNOSIS — Y999 Unspecified external cause status: Secondary | ICD-10-CM | POA: Diagnosis not present

## 2014-12-04 DIAGNOSIS — Z79899 Other long term (current) drug therapy: Secondary | ICD-10-CM | POA: Insufficient documentation

## 2014-12-04 DIAGNOSIS — R220 Localized swelling, mass and lump, head: Secondary | ICD-10-CM | POA: Diagnosis present

## 2014-12-04 DIAGNOSIS — Z7951 Long term (current) use of inhaled steroids: Secondary | ICD-10-CM | POA: Diagnosis not present

## 2014-12-04 DIAGNOSIS — Y929 Unspecified place or not applicable: Secondary | ICD-10-CM | POA: Diagnosis not present

## 2014-12-04 DIAGNOSIS — R0789 Other chest pain: Secondary | ICD-10-CM

## 2014-12-04 DIAGNOSIS — J392 Other diseases of pharynx: Secondary | ICD-10-CM | POA: Diagnosis not present

## 2014-12-04 DIAGNOSIS — Z8632 Personal history of gestational diabetes: Secondary | ICD-10-CM | POA: Diagnosis not present

## 2014-12-04 DIAGNOSIS — R0989 Other specified symptoms and signs involving the circulatory and respiratory systems: Secondary | ICD-10-CM

## 2014-12-04 DIAGNOSIS — T7840XA Allergy, unspecified, initial encounter: Secondary | ICD-10-CM | POA: Diagnosis not present

## 2014-12-04 DIAGNOSIS — R6889 Other general symptoms and signs: Secondary | ICD-10-CM

## 2014-12-04 LAB — CBC WITH DIFFERENTIAL/PLATELET
Basophils Absolute: 0.1 10*3/uL (ref 0.0–0.1)
Basophils Relative: 1 % (ref 0–1)
Eosinophils Absolute: 0.2 10*3/uL (ref 0.0–0.7)
Eosinophils Relative: 2 % (ref 0–5)
HCT: 43 % (ref 36.0–46.0)
Hemoglobin: 14.2 g/dL (ref 12.0–15.0)
Lymphocytes Relative: 35 % (ref 12–46)
Lymphs Abs: 2.3 10*3/uL (ref 0.7–4.0)
MCH: 29.7 pg (ref 26.0–34.0)
MCHC: 33 g/dL (ref 30.0–36.0)
MCV: 90 fL (ref 78.0–100.0)
Monocytes Absolute: 0.7 10*3/uL (ref 0.1–1.0)
Monocytes Relative: 10 % (ref 3–12)
Neutro Abs: 3.4 10*3/uL (ref 1.7–7.7)
Neutrophils Relative %: 52 % (ref 43–77)
Platelets: 304 10*3/uL (ref 150–400)
RBC: 4.78 MIL/uL (ref 3.87–5.11)
RDW: 12.6 % (ref 11.5–15.5)
WBC: 6.6 10*3/uL (ref 4.0–10.5)

## 2014-12-04 LAB — COMPREHENSIVE METABOLIC PANEL
ALT: 25 U/L (ref 14–54)
AST: 21 U/L (ref 15–41)
Albumin: 4.4 g/dL (ref 3.5–5.0)
Alkaline Phosphatase: 56 U/L (ref 38–126)
Anion gap: 11 (ref 5–15)
BUN: 17 mg/dL (ref 6–20)
CO2: 27 mmol/L (ref 22–32)
Calcium: 9.7 mg/dL (ref 8.9–10.3)
Chloride: 100 mmol/L — ABNORMAL LOW (ref 101–111)
Creatinine, Ser: 0.62 mg/dL (ref 0.44–1.00)
GFR calc Af Amer: >60 mL/min (ref >60)
GFR calc non Af Amer: >60 mL/min (ref >60)
Glucose, Bld: 88 mg/dL (ref 65–99)
Potassium: 3 mmol/L — ABNORMAL LOW (ref 3.5–5.1)
Sodium: 138 mmol/L (ref 135–145)
Total Bilirubin: 0.5 mg/dL (ref 0.3–1.2)
Total Protein: 7.4 g/dL (ref 6.5–8.1)

## 2014-12-04 LAB — D-DIMER, QUANTITATIVE: D-Dimer, Quant: <0.27 ug/mL-FEU (ref 0.00–0.48)

## 2014-12-04 LAB — I-STAT TROPONIN, ED: Troponin i, poc: 0.01 ng/mL (ref 0.00–0.08)

## 2014-12-04 LAB — PROTIME-INR
INR: 0.94 (ref 0.00–1.49)
Prothrombin Time: 12.8 seconds (ref 11.6–15.2)

## 2014-12-04 MED ORDER — POTASSIUM CHLORIDE CRYS ER 20 MEQ PO TBCR
40.0000 meq | EXTENDED_RELEASE_TABLET | Freq: Once | ORAL | Status: DC
  Filled 2014-12-04: qty 2

## 2014-12-04 MED ORDER — EPINEPHRINE 0.3 MG/0.3ML IJ SOAJ: 0.3000 mg | Freq: Once | INTRAMUSCULAR | Status: DC

## 2014-12-04 MED ORDER — POTASSIUM CHLORIDE 20 MEQ/15ML (10%) PO SOLN
40.0000 meq | Freq: Once | ORAL | Status: AC
  Administered 2014-12-04: 40 meq via ORAL
  Filled 2014-12-04: qty 30

## 2014-12-04 MED ORDER — FAMOTIDINE IN NACL 20-0.9 MG/50ML-% IV SOLN
20.0000 mg | Freq: Once | INTRAVENOUS | Status: AC
  Administered 2014-12-04: 20 mg via INTRAVENOUS
  Filled 2014-12-04: qty 50

## 2014-12-04 MED ORDER — DIPHENHYDRAMINE HCL 50 MG/ML IJ SOLN
25.0000 mg | Freq: Once | INTRAMUSCULAR | Status: AC
  Administered 2014-12-04: 25 mg via INTRAVENOUS
  Filled 2014-12-04: qty 1

## 2014-12-04 MED ORDER — METHYLPREDNISOLONE SODIUM SUCC 125 MG IJ SOLR
125.0000 mg | Freq: Once | INTRAMUSCULAR | Status: AC
  Administered 2014-12-04: 125 mg via INTRAVENOUS
  Filled 2014-12-04: qty 2

## 2014-12-04 MED ORDER — PREDNISONE 10 MG PO TABS: 20.0000 mg | ORAL_TABLET | Freq: Every day | ORAL | Status: DC

## 2014-12-04 MED ORDER — SODIUM CHLORIDE 0.9 % IV BOLUS (SEPSIS)
1000.0000 mL | Freq: Once | INTRAVENOUS | Status: AC
  Administered 2014-12-04: 1000 mL via INTRAVENOUS

## 2014-12-04 NOTE — ED Provider Notes (Signed)
CSN: [id number]     Arrival date & time 12/04/14  1626 History   First MD Initiated Contact with Patient 12/04/14 1635     Chief Complaint  Patient presents with  . Oral Swelling     (Consider location/radiation/quality/duration/timing/severity/associated sxs/prior Treatment) HPI    PCP: Marjorie Smolder, MD Blood pressure 119/70, pulse 83, temperature 98.6 F (37 C), temperature source Oral, resp. rate 22, SpO2 100 %.  Kathryn Griffin is a 59 y.o.female with a significant PMH of GERD, reglux and recurrent presents to the ER with complaints of concerns about swelling to her throat and chest after eating which lasted an hour after eating, today she ate chicken for lunch and developed the symptoms but this time the swelling has not improved. The patient is highly anxious due to being concerned about is she has having a reoccurance of Aspergelluis Furnigatus which she has had in the past. This does not feel the same but anytime she develops an oddity in medical symptoms she becomes very anxious. She is able to swallow food, liquid and saliva but reports difficulty with all of these because it feels tight. She reports while at her daughters school before lunch they were using a lot of strong chemicals and she remembers inhaling them. She feels like this may have caused her symptoms as well as she has never inhaled so many strong chemicals before. She is not having any pain or burning to her chest neck, teeth, throat or mouth. No swelling appreciated to lips, or tongue, face. The patient appears anxious but is speaking in full sentences, swallowing her saliva, and is at a 100% oxygen saturation.   Past Medical History  Diagnosis Date  . Gestational diabetes   . Reflux   . Aspergillus fumigatus 11/14   Past Surgical History  Procedure Laterality Date  . Tubal ligation  1995  . Breast cyst excision  1990  . Colposcopy  2009    benign   Family History  Problem Relation Age of Onset  . Cancer  Mother     bladder  . Diabetes Brother   . Hypertension Maternal Grandmother   . Ovarian cancer Maternal Grandmother   . Heart disease Maternal Grandmother   . Diabetes Paternal Grandmother   . COPD Father     heavy smoker  . Prostate cancer Father    History  Substance Use Topics  . Smoking status: Never Smoker   . Smokeless tobacco: Never Used  . Alcohol Use: Yes     Comment: social   OB History    Gravida Para Term Preterm AB TAB SAB Ectopic Multiple Living   4 2   2  2   2      Review of Systems  10 Systems reviewed and are negative for acute change except as noted in the HPI.     Allergies  Molds & smuts and Protonix  Home Medications   Prior to Admission medications   Medication Sig Start Date End Date Taking? Authorizing Provider  albuterol (PROAIR HFA) 108 (90 BASE) MCG/ACT inhaler Inhale 2 puffs into the lungs every 6 (six) hours as needed.   Yes Historical Provider, MD  albuterol (PROVENTIL) (2.5 MG/3ML) 0.083% nebulizer solution Take 3 mLs (2.5 mg total) by nebulization every 6 (six) hours as needed for wheezing or shortness of breath. 05/06/14  Yes Juanito Doom, MD  beclomethasone (QVAR) 80 MCG/ACT inhaler Inhale 4 puffs into the lungs 2 (two) times daily. Patient taking differently: Inhale 2  puffs into the lungs 2 (two) times daily.  01/20/14  Yes Juanito Doom, MD  Calcium Citrate (CITRACAL PO) Take 1 tablet by mouth daily at 8 pm.    Yes Historical Provider, MD  esomeprazole (NEXIUM) 40 MG capsule Take 40 mg by mouth daily.   Yes Historical Provider, MD  Estradiol 10 MCG TABS vaginal tablet Place 1 tablet (10 mcg total) vaginally 2 (two) times a week. 10/07/14  Yes Terrance Mass, MD  famotidine (PEPCID) 20 MG tablet Take 20 mg by mouth as needed.    Yes Historical Provider, MD  fexofenadine (ALLEGRA) 180 MG tablet Take 180 mg by mouth daily.   Yes Historical Provider, MD  fluticasone (FLONASE) 50 MCG/ACT nasal spray Place 2 sprays into both  nostrils daily.    Yes Historical Provider, MD  Multiple Vitamin (MULTIVITAMIN) capsule Take 1 capsule by mouth daily.     Yes Historical Provider, MD  Spacer/Aero-Holding Chambers (AEROCHAMBER MV) inhaler Use as instructed 09/16/13  Yes Juanito Doom, MD  EPINEPHrine (EPIPEN 2-PAK) 0.3 mg/0.3 mL IJ SOAJ injection Inject 0.3 mLs (0.3 mg total) into the muscle once. 12/04/14   Cate Oravec Carlota Raspberry, PA-C  levofloxacin (LEVAQUIN) 750 MG tablet Take 1 tablet (750 mg total) by mouth daily. Patient not taking: Reported on 12/04/2014 09/22/14   Kathee Delton, MD  predniSONE (DELTASONE) 10 MG tablet Take 4 tabs daily x 2 days, 3 tabs daily x 2 days, 2 tabs daily x 2 days, 1 tab daily x 2 days Patient not taking: Reported on 12/04/2014 09/22/14   Kathee Delton, MD  predniSONE (DELTASONE) 10 MG tablet Take 2 tablets (20 mg total) by mouth daily. 12/04/14   Ewel Lona Carlota Raspberry, PA-C   BP 110/60 mmHg  Pulse 68  Temp(Src) 97.6 F (36.4 C) (Oral)  Resp 13  SpO2 97% Physical Exam  Constitutional: She is oriented to person, place, and time. She appears well-developed and well-nourished. She appears distressed (anxiety).  HENT:  Head: Normocephalic and atraumatic.  Right Ear: Tympanic membrane and ear canal normal.  Left Ear: Tympanic membrane and ear canal normal.  Nose: Nose normal.  Mouth/Throat: Uvula is midline, oropharynx is clear and moist and mucous membranes are normal.  No angioedema.  Eyes: Conjunctivae, EOM and lids are normal. Pupils are equal, round, and reactive to light.  Neck: Normal range of motion. Neck supple. No spinous process tenderness and no muscular tenderness present. Normal range of motion present.  Cardiovascular: Normal rate and regular rhythm.   Pulmonary/Chest: Effort normal and breath sounds normal. She has no decreased breath sounds. She has no wheezes.  Abdominal: Soft. Bowel sounds are normal. There is no tenderness.  Neurological: She is alert and oriented to person, place, and  time.  Skin: Skin is warm and dry.  Psychiatric: Her mood appears anxious.  Nursing note and vitals reviewed.   ED Course  Procedures (including critical care time) Labs Review Labs Reviewed  COMPREHENSIVE METABOLIC PANEL - Abnormal; Notable for the following:    Potassium 3.0 (*)    Chloride 100 (*)    All other components within normal limits  CBC WITH DIFFERENTIAL/PLATELET  PROTIME-INR  D-DIMER, QUANTITATIVE (NOT AT [facility])  I-STAT TROPOININ, ED    Imaging Review No results found.   EKG Interpretation   Date/Time:  Thursday December 04 2014 17:47:40 EDT Ventricular Rate:  64 PR Interval:  157 QRS Duration: 92 QT Interval:  429 QTC Calculation: 443 R Axis:   82 Text Interpretation:  Sinus rhythm No significant change since last  tracing Confirmed by Kathrynn Humble, MD, ANKIT [phone]) on 12/04/2014 6:56:35 PM      MDM   Final diagnoses:  Chest tightness  Anxiety  Throat tightness  Allergic reaction, initial encounter   Medications  sodium chloride 0.9 % bolus 1,000 mL (0 mLs Intravenous Stopped 12/04/14 1910)  diphenhydrAMINE (BENADRYL) injection 25 mg (25 mg Intravenous Given 12/04/14 1806)  methylPREDNISolone sodium succinate (SOLU-MEDROL) 125 mg/2 mL injection 125 mg (125 mg Intravenous Given 12/04/14 1804)  famotidine (PEPCID) IVPB 20 mg premix (0 mg Intravenous Stopped 12/04/14 1910)  potassium chloride 20 MEQ/15ML (10%) solution 40 mEq (40 mEq Oral Given 12/04/14 1950)   The patient present to the ED with symptoms of possible allergic reaction vs esophageal stricture, esophageal spasm. The patient reports complete relief of her symptoms in the ED. No longer feels as though throat is tight. Swallowing back to baseline. She was treated as an allergic reaction therefore will rx medication for home to treat allergic reaction.  PRedisone taper, Benadryl, Claritin and Pepcid. EPI PEN for emergencies rx.  Referral to GI, pt has a pulmonologist   Medications  sodium chloride 0.9 %  bolus 1,000 mL (0 mLs Intravenous Stopped 12/04/14 1910)  diphenhydrAMINE (BENADRYL) injection 25 mg (25 mg Intravenous Given 12/04/14 1806)  methylPREDNISolone sodium succinate (SOLU-MEDROL) 125 mg/2 mL injection 125 mg (125 mg Intravenous Given 12/04/14 1804)  famotidine (PEPCID) IVPB 20 mg premix (0 mg Intravenous Stopped 12/04/14 1910)  potassium chloride 20 MEQ/15ML (10%) solution 40 mEq (40 mEq Oral Given 12/04/14 1950)    59 y.o.Raynelle Jan Blumenstein's evaluation in the Emergency Department is complete. It has been determined that no acute conditions requiring further emergency intervention are present at this time. The patient/guardian have been advised of the diagnosis and plan. We have discussed signs and symptoms that warrant return to the ED, such as changes or worsening in symptoms.  Vital signs are stable at discharge. Filed Vitals:   12/04/14 2043  BP: 110/60  Pulse: 68  Temp:   Resp: 13    Patient/guardian has voiced understanding and agreed to follow-up with the PCP or specialist.     Delos Haring, PA-C 12/07/14 [place], MD 12/08/14 [phone]

## 2014-12-04 NOTE — ED Notes (Signed)
Delay in lab draw, pt currently in exray 

## 2014-12-04 NOTE — Discharge Instructions (Signed)
Epinephrine injection (Auto-injector) What is this medicine? EPINEPHRINE (ep i NEF rin) is used for the emergency treatment of severe allergic reactions. You should keep this medicine with you at all times. This medicine may be used for other purposes; ask your health care provider or pharmacist if you have questions. COMMON BRAND NAME(S): Adrenaclick, Auvi-Q, EpiPen, Twinject What should I tell my health care provider before I take this medicine? They need to know if you have any of the following conditions: -diabetes -heart disease -high blood pressure -lung or breathing disease, like asthma -Parkinson's disease -thyroid disease -an unusual or allergic reaction to epinephrine, sulfites, other medicines, foods, dyes, or preservatives -pregnant or trying to get pregnant -breast-feeding How should I use this medicine? This medicine is for injection into the outer thigh. Your doctor or health care professional will instruct you on the proper use of the device during an emergency. Read all directions carefully and make sure you understand them. Do not use more often than directed. Talk to your pediatrician regarding the use of this medicine in children. Special care may be needed. This drug is commonly used in children. A special device is available for use in children. Overdosage: If you think you have taken too much of this medicine contact a poison control center or emergency room at once. NOTE: This medicine is only for you. Do not share this medicine with others. What if I miss a dose? This does not apply. You should only use this medicine for an allergic reaction. What may interact with this medicine? This medicine is only used during an emergency. Significant drug interactions are not likely during emergency use. This list may not describe all possible interactions. Give your health care provider a list of all the medicines, herbs, non-prescription drugs, or dietary supplements you use.  Also tell them if you smoke, drink alcohol, or use illegal drugs. Some items may interact with your medicine. What should I watch for while using this medicine? Keep this medicine ready for use in the case of a severe allergic reaction. Make sure that you have the phone number of your doctor or health care professional and local hospital ready. Remember to check the expiration date of your medicine regularly. You may need to have additional units of this medicine with you at work, school, or other places. Talk to your doctor or health care professional about your need for extra units. Some emergencies may require an additional dose. Check with your doctor or a health care professional before using an extra dose. After use, go to the nearest hospital or call 911. Avoid physical activity. Make sure the treating health care professional knows you have received an injection of this medicine. You will receive additional instructions on what to do during and after use of this medicine before a medical emergency occurs. What side effects may I notice from receiving this medicine? Side effects that you should report to your doctor or health care professional as soon as possible: -allergic reactions like skin rash, itching or hives, swelling of the face, lips, or tongue -breathing problems -chest pain -flushing -irregular or pounding heartbeat -numbness in fingers or toes -vomiting Side effects that usually do not require medical attention (report to your doctor or health care professional if they continue or are bothersome): -anxiety or nervousness -dizzy, drowsy -dry mouth -headache -increased sweating -nausea -tired, weak This list may not describe all possible side effects. Call your doctor for medical advice about side effects. You may report side  effects to FDA at 1-800-FDA-1088. Where should I keep my medicine? Keep out of the reach of children. Store at room temperature between 15 and 30  degrees C (59 and 86 degrees F). Protect from light and heat. The solution should be clear in color. If the solution is discolored or contains particles it must be replaced. Throw away any unused medicine after the expiration date. Ask your doctor or pharmacist about proper disposal of the injector if it is expired or has been used. Always replace your auto-injector before it expires. NOTE: This sheet is a summary. It may not cover all possible information. If you have questions about this medicine, talk to your doctor, pharmacist, or health care provider.  2015, Elsevier/Gold Standard. (2012-10-22 14:59:01)

## 2014-12-04 NOTE — ED Notes (Signed)
Pt states over the last couple of days has been feeling a pressure in throat after she eats something, pt states she feels as if her throat is welling.

## 2014-12-08 ENCOUNTER — Other Ambulatory Visit: Payer: Self-pay | Admitting: Gastroenterology

## 2014-12-08 DIAGNOSIS — R1310 Dysphagia, unspecified: Secondary | ICD-10-CM

## 2014-12-11 ENCOUNTER — Ambulatory Visit
Admission: RE | Admit: 2014-12-11 | Discharge: 2014-12-11 | Disposition: A | Discharge status: Home / Self Care | Payer: 59 | Source: Ambulatory Visit | Attending: Gastroenterology | Admitting: Gastroenterology

## 2014-12-11 DIAGNOSIS — R1310 Dysphagia, unspecified: Secondary | ICD-10-CM

## 2015-01-21 ENCOUNTER — Encounter: Payer: Self-pay | Admitting: Pulmonary Disease

## 2015-01-21 ENCOUNTER — Ambulatory Visit (INDEPENDENT_AMBULATORY_CARE_PROVIDER_SITE_OTHER): Payer: 59 | Admitting: Pulmonary Disease

## 2015-01-21 VITALS — BP 116/64 | HR 68 | Ht 67.5 in | Wt 162.0 lb

## 2015-01-21 DIAGNOSIS — B4489 Other forms of aspergillosis: Secondary | ICD-10-CM

## 2015-01-21 DIAGNOSIS — Z23 Encounter for immunization: Secondary | ICD-10-CM | POA: Diagnosis not present

## 2015-01-21 NOTE — Assessment & Plan Note (Signed)
This has been a stable interval for [facility]. She has not had a flareup since March. She saw the mold specialist at [facility] who agreed with our management in treating this as ABPA.  Plan: Prevnar vaccine today Flu shot in the fall Continue Qvar 2 puffs twice a day Treat bronchitis episodes with antibiotics and prednisone if wheezing Follow-up with me in 6 months or sooner if needed

## 2015-01-21 NOTE — Progress Notes (Signed)
Subjective:    Patient ID: Kathryn Griffin, female    DOB: 1955/11/18, 59 y.o.   MRN: [id number]  Synopsis: 59 yo female developed cough and thick mucus produciton for the first time in her life in 2013. Produced casts with coughing on several occassions and responded to antibiotic and prednisone therapy.  Bronchoscopy in 04/2013 showed mucus plugging/cast LUL. Bronch cultures grew aspergillus species. Baseline GERD.  Methacholine challenge 01/2013 negative.  IgE low/normal 2014. Eosinophil elevated one time in early 2014.  She was treated with itraconazole for a month and did well by January 2015.  HPI Chief Complaint  Patient presents with  . Follow-up    Pt states she's been doing well.  Pt states she had a prod cough on Monday, was an isolated incident.      Kathryn Griffin has been doing well since March when she saw Dr. Gwenette Greet.  She was treated for bronchitis. Since then she has been "really good".  She was treated with prednisone for the bronchitis by Dr. Gwenette Greet and then she had a "weird reaction" to a cleaning solution at work and she had some wheezing and her "throat closed up". She is using QVar 2 puffs bid right now.  Monday she had some cough and nasty mucus production.   Past Medical History  Diagnosis Date  . Gestational diabetes   . Reflux   . Aspergillus fumigatus 11/14     Review of Systems  Constitutional: Negative for fever, chills and fatigue.  HENT: Negative for postnasal drip, rhinorrhea and sinus pressure.   Respiratory: Negative for cough, shortness of breath and wheezing.   Cardiovascular: Negative for chest pain, palpitations and leg swelling.         Objective:   Physical Exam Filed Vitals:   01/21/15 1547  BP: 116/64  Pulse: 68  Height: 5' 7.5" (1.715 m)  Weight: 162 lb (73.483 kg)  SpO2: 98%  RA  Gen: well appearing, no acute distress HEENT: NCAT, EOMi, OP clear PULM: Small focal wheeze RUL, otherwise clear with good air movement CV: RRR, no mgr,  no JVD AB: BS+, soft, nontender, no hsm Ext: warm, no edema, no clubbing, no cyanosis  08/02/2013 CXR > normal   04/2013 bronch > large left upper lobe mucus plug/cast causing left upper lobe collapse 04/2013 bronch culture> aspergillus species 05/2013 IgE 156 05/2013 Apergillus precipitans > low positive 07/2013 sputum culture > negative 08/2013 sputum culture > aspergillus sp 01/2014 sputum culture > aspergillus sp    Assessment & Plan:   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 She has not had a flareup since March. 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  Plan: Prevnar vaccine today Flu shot in the fall Continue Qvar 2 puffs twice a day Treat bronchitis episodes with antibiotics and prednisone if wheezing Follow-up with me in 6 months or sooner if needed    Updated Medication List Outpatient Encounter Prescriptions as of 01/21/2015  Medication Sig  . albuterol (PROAIR HFA) 108 (90 BASE) MCG/ACT inhaler Inhale 2 puffs into the lungs every 6 (six) hours as needed.  Marland Kitchen albuterol (PROVENTIL) (2.5 MG/3ML) 0.083% nebulizer solution Take 3 mLs (2.5 mg total) by nebulization every 6 (six) hours as needed for wheezing or shortness of breath.  . beclomethasone (QVAR) 80 MCG/ACT inhaler Inhale 4 puffs into the lungs 2 (two) times daily. (Patient taking differently: Inhale 2 puffs into the  lungs 2 (two) times daily. )  . Calcium Citrate (CITRACAL PO) Take 1 tablet by mouth daily at 8 pm.   . EPINEPHrine (EPIPEN 2-PAK) 0.3 mg/0.3 mL IJ SOAJ injection Inject 0.3 mLs (0.3 mg total) into the muscle once.  . Estradiol 10 MCG TABS vaginal tablet Place 1 tablet (10 mcg total) vaginally 2 (two) times a week.  . famotidine (PEPCID) 20 MG tablet Take 20 mg by mouth as needed.   . fexofenadine (ALLEGRA) 180 MG tablet Take 180 mg by mouth daily.  . fluticasone (FLONASE) 50 MCG/ACT nasal spray Place 2 sprays into  both nostrils daily.   Marland Kitchen levofloxacin (LEVAQUIN) 750 MG tablet Take 1 tablet (750 mg total) by mouth daily.  . Multiple Vitamin (MULTIVITAMIN) capsule Take 1 capsule by mouth daily.    Marland Kitchen omeprazole (PRILOSEC) 40 MG capsule Take 40 mg by mouth daily.  Marland Kitchen Spacer/Aero-Holding Chambers (AEROCHAMBER MV) inhaler Use as instructed  . [DISCONTINUED] predniSONE (DELTASONE) 10 MG tablet Take 2 tablets (20 mg total) by mouth daily.  . [DISCONTINUED] esomeprazole (NEXIUM) 40 MG capsule Take 40 mg by mouth daily.  . [DISCONTINUED] predniSONE (DELTASONE) 10 MG tablet Take 4 tabs daily x 2 days, 3 tabs daily x 2 days, 2 tabs daily x 2 days, 1 tab daily x 2 days (Patient not taking: Reported on 01/21/2015)   No facility-administered encounter medications on file as of 01/21/2015.

## 2015-01-21 NOTE — Addendum Note (Signed)
Addended by: Len Blalock on: 01/21/2015 04:51 PM   Modules accepted: Orders

## 2015-01-21 NOTE — Patient Instructions (Signed)
Keep taking your medications as you're doing Get a flu shot in the fall Continue to follow environmental control guidelines outlined by Drs. Perfect and Remus Blake We will see you back in 6 months

## 2015-02-16 ENCOUNTER — Ambulatory Visit (INDEPENDENT_AMBULATORY_CARE_PROVIDER_SITE_OTHER): Payer: 59 | Admitting: Gynecology

## 2015-02-16 ENCOUNTER — Encounter: Payer: Self-pay | Admitting: Gynecology

## 2015-02-16 VITALS — BP 120/74 | Ht 68.0 in | Wt 162.0 lb

## 2015-02-16 DIAGNOSIS — N952 Postmenopausal atrophic vaginitis: Secondary | ICD-10-CM | POA: Diagnosis not present

## 2015-02-16 DIAGNOSIS — Z01419 Encounter for gynecological examination (general) (routine) without abnormal findings: Secondary | ICD-10-CM

## 2015-02-16 DIAGNOSIS — Z1159 Encounter for screening for other viral diseases: Secondary | ICD-10-CM

## 2015-02-16 DIAGNOSIS — M8580 Other specified disorders of bone density and structure, unspecified site: Secondary | ICD-10-CM | POA: Diagnosis not present

## 2015-02-16 MED ORDER — ESTRADIOL 0.1 MG/GM VA CREA: 2.0000 g | TOPICAL_CREAM | VAGINAL | Status: DC

## 2015-02-16 NOTE — Progress Notes (Signed)
Kathryn Griffin September 09, 1955 [id number]   History:    59 y.o.  for annual gyn exam with complaint of vaginal dryness and irritation. She had been on Vagifem 10 g twice a week but is not helping. Prior to that she was on vaginal compounded estrogen but her insurance company would not cover it. Patient denied any vaginal bleeding.Her PCP is Dr. Inda Merlin who has been doing her blood work and her pulmonologist is Dr. Curt Jews. In 2014 patient had a colonoscopy which demonstrated benign polyps. Patient's last bone density study was in February 2014 in the lowest T score was -1.5 at the right femoral neck within normal FRAX Analysis. Patient reports no previous history of any abnormal Pap smears.   Past medical history,surgical history, family history and social history were all reviewed and documented in the EPIC chart.  Gynecologic History No LMP recorded. Patient is postmenopausal. Contraception: post menopausal status Last Pap: 2015. Results were: normal Last mammogram: 2016. Results were: normal  Obstetric History OB History  Gravida Para Term Preterm AB SAB TAB Ectopic Multiple Living  4 2   2 2    2     # Outcome Date GA Lbr Len/2nd Weight Sex Delivery Anes PTL Lv  4 SAB           3 SAB           2 Para           1 Para                ROS: A ROS was performed and pertinent positives and negatives are included in the history.  GENERAL: No fevers or chills. HEENT: No change in vision, no earache, sore throat or sinus congestion. NECK: No pain or stiffness. CARDIOVASCULAR: No chest pain or pressure. No palpitations. PULMONARY: No shortness of breath, cough or wheeze. GASTROINTESTINAL: No abdominal pain, nausea, vomiting or diarrhea, melena or bright red blood per rectum. GENITOURINARY: No urinary frequency, urgency, hesitancy or dysuria. MUSCULOSKELETAL: No joint or muscle pain, no back pain, no recent trauma. DERMATOLOGIC: No rash, no itching, no lesions. ENDOCRINE: No polyuria,  polydipsia, no heat or cold intolerance. No recent change in weight. HEMATOLOGICAL: No anemia or easy bruising or bleeding. NEUROLOGIC: No headache, seizures, numbness, tingling or weakness. PSYCHIATRIC: No depression, no loss of interest in normal activity or change in sleep pattern.     Exam: chaperone present  BP 120/74 mmHg  Ht 5\' 8"  (1.727 m)  Wt 162 lb (73.483 kg)  BMI 24.64 kg/m2  Body mass index is 24.64 kg/(m^2).  General appearance : Well developed well nourished female. No acute distress HEENT: Eyes: no retinal hemorrhage or exudates,  Neck supple, trachea midline, no carotid bruits, no thyroidmegaly Lungs: Clear to auscultation, no rhonchi or wheezes, or rib retractions  Heart: Regular rate and rhythm, no murmurs or gallops Breast:Examined in sitting and supine position were symmetrical in appearance, no palpable masses or tenderness,  no skin retraction, no nipple inversion, no nipple discharge, no skin discoloration, no axillary or supraclavicular lymphadenopathy Abdomen: no palpable masses or tenderness, no rebound or guarding Extremities: no edema or skin discoloration or tenderness  Pelvic:  Bartholin, Urethra, Skene Glands: Within normal limits             Vagina: No gross lesions or discharge, atrophic changes  Cervix: No gross lesions or discharge  Uterus  anteverted, normal size, shape and consistency, non-tender and mobile  Adnexa  Without masses or tenderness  Anus and perineum  normal   Rectovaginal  normal sphincter tone without palpated masses or tenderness             Hemoccult cards will be provided     Assessment/Plan:  59 y.o. female for annual exam who is due for bone density study this October. Patient with past history of cortical steroid usage. Patient's currently on calcium and vitamin D. Patient was given literature information on Osphena. She will be prescribed Estrace vaginal cream to apply twice a week instead of the Vagifem to see if this helps  with her vaginal dryness and irritation. She will see what type of coverage her insurance will provide for her in the event she wants to switch over to the [place]. Patient had most of her lab work done this year at the only blood to she has not had has fasting lipid profile. When she returns back in a few weeks for her bone density study she'll come fasting for fasting lipid profile. Pap smear not done today in accordance to the new guidelines. We discussed importance of monthly self breast examination.  New CDC guidelines is recommending patients be tested once in her lifetime for hepatitis C antibody who were born between [birth year] through 1963. This was discussed with the patient today and has agreed to be tested today.   Terrance Mass MD, 10:42 AM 02/16/2015

## 2015-02-19 ENCOUNTER — Other Ambulatory Visit: Payer: Self-pay | Admitting: Gynecology

## 2015-02-19 ENCOUNTER — Other Ambulatory Visit: Payer: 59

## 2015-02-19 DIAGNOSIS — Z780 Asymptomatic menopausal state: Secondary | ICD-10-CM

## 2015-02-19 LAB — LIPID PANEL
Cholesterol: 196 mg/dL (ref 125–200)
HDL: 87 mg/dL (ref >=46)
LDL Cholesterol: 93 mg/dL (ref <130)
Total CHOL/HDL Ratio: 2.3 Ratio (ref <=5.0)
Triglycerides: 80 mg/dL (ref <150)
VLDL: 16 mg/dL (ref <30)

## 2015-02-19 LAB — HEPATITIS C ANTIBODY: HCV Ab: NEGATIVE

## 2015-02-20 LAB — FOLLICLE STIMULATING HORMONE: FSH: 48.7 m[IU]/mL

## 2015-03-18 ENCOUNTER — Other Ambulatory Visit: Payer: Self-pay | Admitting: Orthopedic Surgery

## 2015-03-18 DIAGNOSIS — M5416 Radiculopathy, lumbar region: Secondary | ICD-10-CM

## 2015-03-18 DIAGNOSIS — M5090 Cervical disc disorder, unspecified, unspecified cervical region: Secondary | ICD-10-CM

## 2015-03-18 DIAGNOSIS — M542 Cervicalgia: Secondary | ICD-10-CM

## 2015-03-19 ENCOUNTER — Other Ambulatory Visit: Payer: 59 | Admitting: Anesthesiology

## 2015-03-19 DIAGNOSIS — Z1211 Encounter for screening for malignant neoplasm of colon: Secondary | ICD-10-CM

## 2015-03-29 ENCOUNTER — Other Ambulatory Visit: Payer: 59

## 2015-04-01 ENCOUNTER — Ambulatory Visit
Admission: RE | Admit: 2015-04-01 | Discharge: 2015-04-01 | Disposition: A | Discharge status: Home / Self Care | Payer: 59 | Source: Ambulatory Visit | Attending: Orthopedic Surgery | Admitting: Orthopedic Surgery

## 2015-04-01 DIAGNOSIS — M542 Cervicalgia: Secondary | ICD-10-CM

## 2015-04-01 DIAGNOSIS — M5416 Radiculopathy, lumbar region: Secondary | ICD-10-CM

## 2015-04-01 DIAGNOSIS — M5090 Cervical disc disorder, unspecified, unspecified cervical region: Secondary | ICD-10-CM

## 2015-05-11 ENCOUNTER — Ambulatory Visit (INDEPENDENT_AMBULATORY_CARE_PROVIDER_SITE_OTHER): Payer: 59

## 2015-05-11 DIAGNOSIS — Z13820 Encounter for screening for osteoporosis: Secondary | ICD-10-CM

## 2015-05-11 DIAGNOSIS — M899 Disorder of bone, unspecified: Secondary | ICD-10-CM

## 2015-05-11 DIAGNOSIS — M8580 Other specified disorders of bone density and structure, unspecified site: Secondary | ICD-10-CM

## 2015-05-12 ENCOUNTER — Other Ambulatory Visit: Payer: Self-pay | Admitting: Gynecology

## 2015-05-12 DIAGNOSIS — Z13820 Encounter for screening for osteoporosis: Secondary | ICD-10-CM

## 2015-05-12 DIAGNOSIS — M8580 Other specified disorders of bone density and structure, unspecified site: Secondary | ICD-10-CM

## 2015-05-15 ENCOUNTER — Other Ambulatory Visit: Payer: Self-pay | Admitting: *Deleted

## 2015-05-15 DIAGNOSIS — M8580 Other specified disorders of bone density and structure, unspecified site: Secondary | ICD-10-CM

## 2015-05-18 ENCOUNTER — Other Ambulatory Visit: Payer: 59

## 2015-05-18 DIAGNOSIS — M8580 Other specified disorders of bone density and structure, unspecified site: Secondary | ICD-10-CM

## 2015-05-19 ENCOUNTER — Other Ambulatory Visit: Payer: Self-pay | Admitting: Gynecology

## 2015-05-19 DIAGNOSIS — E559 Vitamin D deficiency, unspecified: Secondary | ICD-10-CM

## 2015-05-19 LAB — PTH, INTACT AND CALCIUM
Calcium: 8.7 mg/dL (ref 8.4–10.5)
PTH: 53 pg/mL (ref 14–64)

## 2015-05-19 LAB — VITAMIN D 25 HYDROXY (VIT D DEFICIENCY, FRACTURES): Vit D, 25-Hydroxy: 24 ng/mL — ABNORMAL LOW (ref 30–100)

## 2015-05-19 MED ORDER — VITAMIN D (ERGOCALCIFEROL) 1.25 MG (50000 UNIT) PO CAPS: 50000.0000 [IU] | ORAL_CAPSULE | ORAL | Status: DC

## 2015-08-10 ENCOUNTER — Ambulatory Visit (INDEPENDENT_AMBULATORY_CARE_PROVIDER_SITE_OTHER): Payer: 59 | Admitting: Pulmonary Disease

## 2015-08-10 ENCOUNTER — Encounter: Payer: Self-pay | Admitting: Pulmonary Disease

## 2015-08-10 VITALS — BP 114/68 | HR 73 | Ht 67.5 in | Wt 161.0 lb

## 2015-08-10 DIAGNOSIS — B4489 Other forms of aspergillosis: Secondary | ICD-10-CM

## 2015-08-10 NOTE — Progress Notes (Signed)
Subjective:    Patient ID: Kathryn Griffin, female    DOB: 03-29-1956, 60 y.o.   MRN: [id number]  Synopsis: 60 yo female developed cough and thick mucus produciton for the first time in her life in 2013. Produced casts with coughing on several occassions and responded to antibiotic and prednisone therapy.  Bronchoscopy in 04/2013 showed mucus plugging/cast LUL. Bronch cultures grew aspergillus species. Baseline GERD.  Methacholine challenge 01/2013 negative.  IgE low/normal 2014. Eosinophil elevated one time in early 2014.  She was treated with itraconazole for a month and did well by January 2015.  HPI Chief Complaint  Patient presents with  . Follow-up    pt doing well-no complaints today.  pt states she has been doing well since August.      This is been a stable interval for Kathryn Griffin. She has not had an exacerbation since August. She continues to take Qvar 2 puffs twice a day. She has not had any cough or wheezing. She is not producing mucus. She has not had any problems with the Qvar. She brushes her teeth afterwards. She has not noticed any thrush.  Past Medical History  Diagnosis Date  . Reflux   . Aspergillus fumigatus ([place]) 11/14  . Vaginal atrophy      Review of Systems  Constitutional: Negative for fever, chills and fatigue.  HENT: Negative for postnasal drip, rhinorrhea and sinus pressure.   Respiratory: Negative for cough, shortness of breath and wheezing.   Cardiovascular: Negative for chest pain, palpitations and leg swelling.         Objective:   Physical Exam Filed Vitals:   08/10/15 0916  BP: 114/68  Pulse: 73  Height: 5' 7.5" (1.715 m)  Weight: 73.029 kg (161 lb)  SpO2: 99%  RA  Gen: well appearing, no acute distress HEENT: NCAT, EOMi, OP clear PULM: CTA B CV: RRR, no mgr, no JVD AB: BS+, soft, nontender, no hsm Ext: warm, no edema, no clubbing, no cyanosis  08/02/2013 CXR > normal   04/2013 bronch > large left upper lobe mucus plug/cast causing  left upper lobe collapse 04/2013 bronch culture> aspergillus species 05/2013 IgE 156 05/2013 Apergillus precipitans > low positive 07/2013 sputum culture > negative 08/2013 sputum culture > aspergillus sp 01/2014 sputum culture > aspergillus sp    Assessment & Plan:   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 She has been responding well to inhaled steroids.  Plan: I would like to see a year of stability prior to stopping inhaled steroids. Continue Qvar for another 6 months then stop Follow-up with me in 6    Updated Medication List Outpatient Encounter Prescriptions as of 08/10/2015  Medication Sig  . albuterol (PROAIR HFA) 108 (90 BASE) MCG/ACT inhaler Inhale 2 puffs into the lungs every 6 (six) hours as needed.  Marland Kitchen albuterol (PROVENTIL) (2.5 MG/3ML) 0.083% nebulizer solution Take 3 mLs (2.5 mg total) by nebulization every 6 (six) hours as needed for wheezing or shortness of breath.  . beclomethasone (QVAR) 80 MCG/ACT inhaler Inhale 4 puffs into the lungs 2 (two) times daily. (Patient taking differently: Inhale 2 puffs into the lungs 2 (two) times daily. )  . Calcium Citrate (CITRACAL PO) Take 1 tablet by mouth daily at 8 pm.   . EPINEPHrine (EPIPEN 2-PAK) 0.3 mg/0.3 mL IJ SOAJ injection Inject 0.3  mLs (0.3 mg total) into the muscle once.  Marland Kitchen estradiol (ESTRACE) 0.1 MG/GM vaginal cream Place [id number] Applicatorfuls vaginally 2 (two) times a week.  . famotidine (PEPCID) 20 MG tablet Take 20 mg by mouth as needed.   . fexofenadine (ALLEGRA) 180 MG tablet Take 180 mg by mouth daily.  . fluticasone (FLONASE) 50 MCG/ACT nasal spray Place 2 sprays into both nostrils daily.   . Multiple Vitamin (MULTIVITAMIN) capsule Take 1 capsule by mouth daily.    Marland Kitchen omeprazole (PRILOSEC) 40 MG capsule Take 40 mg by mouth daily.   Marland Kitchen Spacer/Aero-Holding Chambers (AEROCHAMBER MV) inhaler Use as instructed  . Vitamin D, Ergocalciferol, (DRISDOL) 50000 UNITS CAPS capsule Take 1 capsule (50,000 Units total) by mouth every 7 (seven) days.   No facility-administered encounter medications on file as of 08/10/2015.

## 2015-08-10 NOTE — Patient Instructions (Signed)
Keep taking Qvar 2 puffs twice a day for another 6 months I will see you back in 6 months and then we will likely taper the Qvar off

## 2015-08-10 NOTE — Assessment & Plan Note (Signed)
This has been a stable interval for her.  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 She has been responding well to inhaled steroids.  Plan: I would like to see a year of stability prior to stopping inhaled steroids. Continue Qvar for another 6 months then stop Follow-up with me in 6

## 2015-08-14 ENCOUNTER — Other Ambulatory Visit: Payer: 59

## 2015-08-14 DIAGNOSIS — E559 Vitamin D deficiency, unspecified: Secondary | ICD-10-CM

## 2015-08-15 LAB — VITAMIN D 25 HYDROXY (VIT D DEFICIENCY, FRACTURES): Vit D, 25-Hydroxy: 46 ng/mL (ref 30–100)

## 2015-08-16 ENCOUNTER — Encounter: Payer: Self-pay | Admitting: Gynecology

## 2015-09-18 ENCOUNTER — Telehealth: Payer: Self-pay | Admitting: Pulmonary Disease

## 2015-09-18 NOTE — Telephone Encounter (Signed)
K

## 2015-09-18 NOTE — Telephone Encounter (Signed)
Spoke with Kathryn Griffin and he states that pt was seen at [facility] on the 19th. Pt tested positive for flu, CXR was negative. Pt started on Levaquin. Will send to [facility] as FYI.

## 2015-10-12 ENCOUNTER — Other Ambulatory Visit: Payer: Self-pay

## 2015-10-12 DIAGNOSIS — Z1231 Encounter for screening mammogram for malignant neoplasm of breast: Secondary | ICD-10-CM

## 2015-10-22 ENCOUNTER — Encounter: Payer: Self-pay | Admitting: Gynecology

## 2015-10-22 ENCOUNTER — Ambulatory Visit (INDEPENDENT_AMBULATORY_CARE_PROVIDER_SITE_OTHER): Payer: 59 | Admitting: Gynecology

## 2015-10-22 VITALS — BP 132/78

## 2015-10-22 DIAGNOSIS — Z124 Encounter for screening for malignant neoplasm of cervix: Secondary | ICD-10-CM | POA: Diagnosis not present

## 2015-10-22 DIAGNOSIS — N952 Postmenopausal atrophic vaginitis: Secondary | ICD-10-CM | POA: Diagnosis not present

## 2015-10-22 DIAGNOSIS — N950 Postmenopausal bleeding: Secondary | ICD-10-CM

## 2015-10-22 DIAGNOSIS — Z8639 Personal history of other endocrine, nutritional and metabolic disease: Secondary | ICD-10-CM | POA: Diagnosis not present

## 2015-10-22 DIAGNOSIS — D251 Intramural leiomyoma of uterus: Secondary | ICD-10-CM | POA: Diagnosis not present

## 2015-10-22 NOTE — Progress Notes (Signed)
   Patient is a 60 year old who presented to the office with a complaint of vaginal bleeding for 2 days. Patient stated that her bleeding started very light yesterday and today was like to start a new menstrual cycle. Since early part of April she was complaining of low abdominal cramping for which she went to see her primary physician this week. They found blood in her urine and she's been worked up for that. She denied any fever, chills, nausea, or vomiting. No change in appetite. She has one to 2 bowel movements a times a day but no blood in stool was noted. Review of her record indicated that in 2010 2011 she had postmenopausal bleeding and had benign endometrial biopsy. She also had a sonohysterogram in 2011 and all his small intramural fibroid measuring 2.3 x 1.4 cm was noted. Patient very infrequently has use vaginal estrogen for her vaginal atrophy but more times she forgets to use it. She denies any recent intercourse or vaginal trauma. Patient was recently treated for vitamin D deficiency and her follow-up vitamin D level was normal  ROS: A ROS was performed and pertinent positives and negatives are included in the history.  GENERAL: No fevers or chills. HEENT: No change in vision, no earache, sore throat or sinus congestion. NECK: No pain or stiffness. CARDIOVASCULAR: No chest pain or pressure. No palpitations. PULMONARY: No shortness of breath, cough or wheeze. GASTROINTESTINAL: No, nausea, vomiting or diarrhea, melena or bright red blood per rectum. GENITOURINARY: No urinary frequency, urgency, hesitancy or dysuria. MUSCULOSKELETAL: No joint or muscle pain, no back pain, no recent trauma. DERMATOLOGIC: No rash, no itching, no lesions. ENDOCRINE: No polyuria, polydipsia, no heat or cold intolerance. No recent change in weight. HEMATOLOGICAL: No anemia or easy bruising or bleeding. NEUROLOGIC: No headache, seizures, numbness, tingling or weakness. PSYCHIATRIC: No depression, no loss of interest in  normal activity or change in sleep pattern.   Exam: Gen. appearance well-developed well-nourished female with above-mentioned complaining of vaginal bleeding Back: No CVA tenderness Abdomen: Soft nontender no rebound or guarding Pelvic: Bartholin urethra Skene was within normal limits Vagina: No blood was noted in the vaginal vault. Atrophic changes Cervix: No lesions or discharge Uterus: Anteverted normal size shape and consistency nontender Adnexa: No palpable masses or tenderness Rectal exam not done  Patient was counseled for an endometrial biopsy as a result of her postmenopausal bleeding. The cervix was cleansed with Betadine solution. A CO2 tenaculum was placed on the anterior cervical lip. A sterile Pipelle was introduced into the uterine cavity very little tissue obtained but was submitted for histological evaluation.  Assessment/plan: 60 year old patient with postmenopausal bleeding. Similar scenarios in 2010 2011 with benign endometrial biopsy only a small intramural myoma. An endometrial biopsy was obtained today with minimal tissue symmetric histological evaluation pathology report pending at time of this dictation. Patient will return back to the office next week for sonohysterogram to rule out any intracavitary abnormality such as an endometrial polyp or submucous myoma. Will notify the patient as soon as pathology report become available. Cousin her vitamin D deficiency history she was encouraged to take vitamin D 3 2000 units daily.  Greater than 50% of time was spent counseling and coordinating care of this patient with postmenopausal bleeding 15 minutes      Terrance Mass MD, 3:06 PM 10/22/2015

## 2015-10-22 NOTE — Patient Instructions (Signed)
Endometrial Biopsy Endometrial biopsy is a procedure in which a tissue sample is taken from inside the uterus. The tissue sample is then looked at under a microscope to see if the tissue is normal or abnormal. The endometrium is the lining of the uterus. This procedure helps determine where you are in your menstrual cycle and how hormone levels are affecting the lining of the uterus. This procedure may also be used to evaluate uterine bleeding or to diagnose endometrial cancer, tuberculosis, polyps, or inflammatory conditions.  LET YOUR HEALTH CARE PROVIDER KNOW ABOUT:  Any allergies you have.  All medicines you are taking, including vitamins, herbs, eye drops, creams, and over-the-counter medicines.  Previous problems you or members of your family have had with the use of anesthetics.  Any blood disorders you have.  Previous surgeries you have had.  Medical conditions you have.  Possibility of pregnancy. RISKS AND COMPLICATIONS Generally, this is a safe procedure. However, as with any procedure, complications can occur. Possible complications include:  Bleeding.  Pelvic infection.  Puncture of the uterine wall with the biopsy device (rare). BEFORE THE PROCEDURE   Keep a record of your menstrual cycles as directed by your health care provider. You may need to schedule your procedure for a specific time in your cycle.  You may want to bring a sanitary pad to wear home after the procedure.  Arrange for someone to drive you home after the procedure if you will be given a medicine to help you relax (sedative). PROCEDURE   You may be given a sedative to relax you.  You will lie on an exam table with your feet and legs supported as in a pelvic exam.  Your health care provider will insert an instrument (speculum) into your vagina to see your cervix.  Your cervix will be cleansed with an antiseptic solution. A medicine (local anesthetic) will be used to numb the cervix.  A forceps  instrument (tenaculum) will be used to hold your cervix steady for the biopsy.  A thin, rodlike instrument (uterine sound) will be inserted through your cervix to determine the length of your uterus and the location where the biopsy sample will be removed.  A thin, flexible tube (catheter) will be inserted through your cervix and into the uterus. The catheter is used to collect the biopsy sample from your endometrial tissue.  The catheter and speculum will then be removed, and the tissue sample will be sent to a lab for examination. AFTER THE PROCEDURE  You will rest in a recovery area until you are ready to go home.  You may have mild cramping and a small amount of vaginal bleeding for a few days after the procedure. This is normal.  Make sure you find out how to get your test results.   This information is not intended to replace advice given to you by your health care provider. Make sure you discuss any questions you have with your health care provider.   Document Released: 10/14/2004 Document Revised: 02/13/2013 Document Reviewed: 11/28/2012 Elsevier Interactive Patient Education 2016 [employer]  

## 2015-10-22 NOTE — Addendum Note (Signed)
Addended by: Thurnell Garbe A on: 10/22/2015 04:23 PM   Modules accepted: Orders

## 2015-10-23 ENCOUNTER — Telehealth: Payer: Self-pay | Admitting: Gynecology

## 2015-10-23 ENCOUNTER — Ambulatory Visit: Payer: 59 | Admitting: Women's Health

## 2015-10-23 NOTE — Telephone Encounter (Signed)
10/23/15-I spoke with patient to let her know that her [facility] states she has a 20% coins due for the sonohysterogram. Allowable is $893.27 so pt will Have to pay $178.65 date of service. Per [email or web address]

## 2015-10-26 ENCOUNTER — Other Ambulatory Visit: Payer: Self-pay | Admitting: Gynecology

## 2015-10-26 ENCOUNTER — Ambulatory Visit (INDEPENDENT_AMBULATORY_CARE_PROVIDER_SITE_OTHER): Payer: 59 | Admitting: Gynecology

## 2015-10-26 ENCOUNTER — Encounter: Payer: Self-pay | Admitting: Gynecology

## 2015-10-26 ENCOUNTER — Ambulatory Visit (INDEPENDENT_AMBULATORY_CARE_PROVIDER_SITE_OTHER): Payer: 59

## 2015-10-26 DIAGNOSIS — N950 Postmenopausal bleeding: Secondary | ICD-10-CM

## 2015-10-26 DIAGNOSIS — D251 Intramural leiomyoma of uterus: Secondary | ICD-10-CM

## 2015-10-26 LAB — PAP, TP IMAGING W/ HPV RNA, RFLX HPV TYPE 16,18/45: HPV mRNA, High Risk: NOT DETECTED

## 2015-10-26 NOTE — Progress Notes (Signed)
   HPI: Patient was seen the office on April 27 as a result of her postmenopausal bleeding as follows: "Patient been complaining of vaginal bleeding for 2 days prior to her office visit.Patient stated that her bleeding started very light yesterday and today was like to start a new menstrual cycle. Since early part of April she was complaining of low abdominal cramping for which she went to see her primary physician this week. They found blood in her urine and she's been worked up for that. She denied any fever, chills, nausea, or vomiting. No change in appetite. She has one to 2 bowel movements a times a day but no blood in stool was noted. Review of her record indicated that in 2010 2011 she had postmenopausal bleeding and had benign endometrial biopsy. She also had a sonohysterogram in 2011 and all his small intramural fibroid measuring 2.3 x 1.4 cm was noted. Patient very infrequently has use vaginal estrogen for her vaginal atrophy but more times she forgets to use it. She denies any recent intercourse or vaginal trauma. Patient was recently treated for vitamin D deficiency and her follow-up vitamin D level was normal  ROS: A ROS was performed and pertinent positives and negatives are included in the history.  GENERAL: No fevers or chills. HEENT: No change in vision, no earache, sore throat or sinus congestion. NECK: No pain or stiffness. CARDIOVASCULAR: No chest pain or pressure. No palpitations. PULMONARY: No shortness of breath, cough or wheeze. GASTROINTESTINAL: No abdominal pain, nausea, vomiting or diarrhea, melena or bright red blood per rectum. GENITOURINARY: No urinary frequency, urgency, hesitancy or dysuria. MUSCULOSKELETAL: No joint or muscle pain, no back pain, no recent trauma. DERMATOLOGIC: No rash, no itching, no lesions. ENDOCRINE: No polyuria, polydipsia, no heat or cold intolerance. No recent change in weight. HEMATOLOGICAL: Postmenopausal bleeding NEUROLOGIC: No headache, seizures,  numbness, tingling or weakness. PSYCHIATRIC: No depression, no loss of interest in normal activity or change in sleep pattern".  Pathology report from endometrial biopsy 10/22/2015: Diagnosis Endometrium, biopsy, uterus - BENIGN INACTIVE ENDOMETRIUM WITH SMALL FOCI OF BREAKDOWN. - NO HYPERPLASIA, ATYPIA OR MALIGNANCY IDENTIFIED  Ultrasound today: Uterus measures 6.3 x 4.6 x 3.57 m with endometrial stripe at 2.7 mm. Right and left ovary normal. A small intramural myoma measuring 18 x 11 mm was noted. The cervix is then cleansed with Betadine solution and sterile catheter was introduced into the uterine cavity no intracavitary uterine defect was noted.   Assessment Plan: Isolated postmenopausal spotting patient on no hormone replacement therapy normal endometrial biopsy and normal sonohysterogram. I've explained to the patient this is probably no atrophic bleed that no further intervention is recommended but to maintain encountered this becomes a persistent problem more frequent we may need to repeat the above tests.

## 2015-10-29 ENCOUNTER — Ambulatory Visit
Admission: RE | Admit: 2015-10-29 | Discharge: 2015-10-29 | Disposition: A | Discharge status: Home / Self Care | Payer: 59 | Source: Ambulatory Visit

## 2015-10-29 DIAGNOSIS — Z1231 Encounter for screening mammogram for malignant neoplasm of breast: Secondary | ICD-10-CM

## 2016-02-17 ENCOUNTER — Encounter: Payer: 59 | Admitting: Gynecology

## 2016-02-18 ENCOUNTER — Encounter: Payer: 59 | Admitting: Gynecology

## 2016-02-19 ENCOUNTER — Ambulatory Visit (INDEPENDENT_AMBULATORY_CARE_PROVIDER_SITE_OTHER): Payer: 59 | Admitting: Pulmonary Disease

## 2016-02-19 ENCOUNTER — Encounter: Payer: Self-pay | Admitting: Pulmonary Disease

## 2016-02-19 DIAGNOSIS — B4489 Other forms of aspergillosis: Secondary | ICD-10-CM | POA: Diagnosis not present

## 2016-02-19 MED ORDER — BECLOMETHASONE DIPROPIONATE 80 MCG/ACT IN AERS: 2.0000 | INHALATION_SPRAY | Freq: Two times a day (BID) | RESPIRATORY_TRACT | 11 refills | Status: DC

## 2016-02-19 NOTE — Progress Notes (Signed)
Subjective:    Patient ID: Kathryn Griffin, female    DOB: [birth year], 60 y.o.   MRN: [id number]  Synopsis: 60 yo female developed cough and thick mucus produciton for the first time in her life in 2013. Produced casts with coughing on several occassions and responded to antibiotic and prednisone therapy.  Bronchoscopy in 04/2013 showed mucus plugging/cast LUL. Bronch cultures grew aspergillus species. Baseline GERD.  Methacholine challenge 01/2013 negative.  IgE low/normal 2014. Eosinophil elevated one time in early 2014.  She was treated with itraconazole for a month and did well by January 2015.  HPI Chief Complaint  Patient presents with  . Follow-up    pt has been doing well, has only had 1 coughing spell since last office visit.  no complaints today.     Coline has been doing great. No problems over the summer. She had one episode over the last week of school when she was exposed to a lot of dust from a paper shredder she had some mucus production.   NO other treatments over the summer.  Currently taking QVar 2 puffs bid.    Past Medical History:  Diagnosis Date  . Aspergillus fumigatus ([place]) 11/14  . Reflux   . Vaginal atrophy   . Vitamin D deficiency      Review of Systems  Constitutional: Negative for chills, fatigue and fever.  HENT: Negative for postnasal drip, rhinorrhea and sinus pressure.   Respiratory: Negative for cough, shortness of breath and wheezing.   Cardiovascular: Negative for chest pain, palpitations and leg swelling.         Objective:   Physical Exam Vitals:   02/19/16 1633  BP: 124/68  BP Location: Right Arm  Cuff Size: Normal  Pulse: 63  SpO2: 97%  Weight: 158 lb (71.7 kg)  Height: 5' 7.5" (1.715 m)  RA  Gen: well appearing, no acute distress HEENT: NCAT, EOMi, OP clear PULM: CTA B CV: RRR, no mgr, no JVD AB: BS+, soft, nontender, no hsm Ext: warm, no edema, no clubbing, no cyanosis  08/02/2013 CXR > normal   04/2013 bronch > large  left upper lobe mucus plug/cast causing left upper lobe collapse 04/2013 bronch culture> aspergillus species 05/2013 IgE 156 05/2013 Apergillus precipitans > low positive 07/2013 sputum culture > negative 08/2013 sputum culture > aspergillus sp 01/2014 sputum culture > aspergillus sp    Assessment & Plan:   Infection due to aspergillus fumigatus ([place]) Sukhman continues to do well with a moderate dose of inhaled corticosteroid. Because of her clinical stability I feel that I can see her again in a year. However, considering the nature of her illness and the severity at the time of diagnosis am not inclined to stop her inhaled corticosteroids until at least about 2019.  Plan: As above   Updated Medication List Outpatient Encounter Prescriptions as of 02/19/2016  Medication Sig  . albuterol (PROAIR HFA) 108 (90 BASE) MCG/ACT inhaler Inhale 2 puffs into the lungs every 6 (six) hours as needed.  . beclomethasone (QVAR) 80 MCG/ACT inhaler Inhale 2 puffs into the lungs 2 (two) times daily.  . Calcium Citrate (CITRACAL PO) Take 1 tablet by mouth daily at 8 pm.   . Cholecalciferol (VITAMIN D) 2000 units CAPS Take 1 capsule by mouth daily.  Marland Kitchen EPINEPHrine (EPIPEN 2-PAK) 0.3 mg/0.3 mL IJ SOAJ injection Inject 0.3 mLs (0.3 mg total) into the muscle once.  Marland Kitchen estradiol (ESTRACE) 0.1 MG/GM vaginal cream Place [id number] Applicatorfuls vaginally 2 (two) times  a week.  . famotidine (PEPCID) 20 MG tablet Take 20 mg by mouth as needed.   . fexofenadine (ALLEGRA) 180 MG tablet Take 180 mg by mouth daily.  . fluticasone (FLONASE) 50 MCG/ACT nasal spray Place 2 sprays into both nostrils daily.   . meloxicam (MOBIC) 15 MG tablet Take 15 mg by mouth daily.  . Multiple Vitamin (MULTIVITAMIN) capsule Take 1 capsule by mouth daily.    Marland Kitchen omeprazole (PRILOSEC) 40 MG capsule Take 40 mg by mouth daily.  Marland Kitchen Spacer/Aero-Holding Chambers (AEROCHAMBER MV) inhaler Use as instructed  . [DISCONTINUED] beclomethasone (QVAR) 80 MCG/ACT  inhaler Inhale 4 puffs into the lungs 2 (two) times daily. (Patient taking differently: Inhale 2 puffs into the lungs 2 (two) times daily. )  . albuterol (PROVENTIL) (2.5 MG/3ML) 0.083% nebulizer solution Take 3 mLs (2.5 mg total) by nebulization every 6 (six) hours as needed for wheezing or shortness of breath. (Patient not taking: Reported on 10/22/2015)  . [DISCONTINUED] Vitamin D, Ergocalciferol, (DRISDOL) 50000 UNITS CAPS capsule Take 1 capsule (50,000 Units total) by mouth every 7 (seven) days. (Patient not taking: Reported on 02/19/2016)   No facility-administered encounter medications on file as of 02/19/2016.

## 2016-02-19 NOTE — Patient Instructions (Signed)
Keep taking Qvar 2 puffs twice a day as you are doing I will see you back in one year or sooner if needed

## 2016-02-19 NOTE — Assessment & Plan Note (Signed)
Kathryn Griffin continues to do well with a moderate dose of inhaled corticosteroid. Because of her clinical stability I feel that I can see her again in a year. However, considering the nature of her illness and the severity at the time of diagnosis am not inclined to stop her inhaled corticosteroids until at least about 2019.  Plan: As above

## 2016-03-20 ENCOUNTER — Telehealth: Payer: Self-pay | Admitting: Pulmonary Disease

## 2016-03-20 DIAGNOSIS — R05 Cough: Secondary | ICD-10-CM

## 2016-03-20 DIAGNOSIS — J9819 Other pulmonary collapse: Secondary | ICD-10-CM

## 2016-03-20 DIAGNOSIS — R059 Cough, unspecified: Secondary | ICD-10-CM

## 2016-03-20 MED ORDER — PREDNISONE 10 MG (21) PO TBPK: 40.0000 mg | ORAL_TABLET | Freq: Every day | ORAL | 0 refills | Status: DC

## 2016-03-20 MED ORDER — ALBUTEROL SULFATE (2.5 MG/3ML) 0.083% IN NEBU: 2.5000 mg | INHALATION_SOLUTION | Freq: Four times a day (QID) | RESPIRATORY_TRACT | 12 refills | Status: DC | PRN

## 2016-03-20 NOTE — Telephone Encounter (Signed)
She has hx of ABPA on Qvar.    She was last seen by Dr. Lake Bells in August 2017.  For past few days she has progressive cough and chest congestion.  She is bringing up dark, sticky phlegm.  She feels tight in her chest.  Sputum has typical taste she gets when her ABPA flares up.  She denies fever, hemoptysis, sinus congestion, headache, ear pain, abdominal pain, diarrhea, skin rash.  Will send script for prednisone.  Will renew her albuterol nebulizer medication.  Will have Triage team arrange for ROV with Dr. Lake Bells or NP this week.

## 2016-03-21 ENCOUNTER — Telehealth: Payer: Self-pay | Admitting: Emergency Medicine

## 2016-03-21 MED ORDER — ALBUTEROL SULFATE (2.5 MG/3ML) 0.083% IN NEBU: 2.5000 mg | INHALATION_SOLUTION | Freq: Four times a day (QID) | RESPIRATORY_TRACT | 12 refills | Status: DC | PRN

## 2016-03-21 MED ORDER — ALBUTEROL SULFATE (2.5 MG/3ML) 0.083% IN NEBU
2.5000 mg | INHALATION_SOLUTION | Freq: Four times a day (QID) | RESPIRATORY_TRACT | 12 refills | Status: DC | PRN
Start: 2016-03-21 — End: 2017-05-24

## 2016-03-21 MED ORDER — PREDNISONE 10 MG (21) PO TBPK: 40.0000 mg | ORAL_TABLET | Freq: Every day | ORAL | 0 refills | Status: DC

## 2016-03-21 MED ORDER — PREDNISONE 10 MG (21) PO TBPK: ORAL_TABLET | ORAL | 0 refills | Status: DC

## 2016-03-21 NOTE — Telephone Encounter (Signed)
Left message with pt. Advised her that I would resend the 2 prescriptions. I have asked her to call back to make an appointment for this week per VS.

## 2016-03-21 NOTE — Telephone Encounter (Signed)
Received page from pt regarding her albuterol script. Attempted to call her but no answer. I will have the office call her in the am to clarify.

## 2016-03-21 NOTE — Telephone Encounter (Signed)
Spoke with pt and she stated that the meds still have not been sent to the pharmacy.  I sent these in for her and she stated that she has already scheduled an appt with VS>

## 2016-03-21 NOTE — Telephone Encounter (Signed)
Patient called states that the 2 meds that were to be called into her pharmacy was never sent. She also mentioned that she coughed up a large amount of phlegm last night. 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

## 2016-03-22 ENCOUNTER — Ambulatory Visit (INDEPENDENT_AMBULATORY_CARE_PROVIDER_SITE_OTHER): Payer: 59 | Admitting: Pulmonary Disease

## 2016-03-22 ENCOUNTER — Encounter: Payer: Self-pay | Admitting: Pulmonary Disease

## 2016-03-22 ENCOUNTER — Ambulatory Visit (INDEPENDENT_AMBULATORY_CARE_PROVIDER_SITE_OTHER)
Admission: RE | Admit: 2016-03-22 | Discharge: 2016-03-22 | Disposition: A | Discharge status: Home / Self Care | Payer: 59 | Source: Ambulatory Visit | Attending: Pulmonary Disease | Admitting: Pulmonary Disease

## 2016-03-22 VITALS — BP 132/78 | HR 76 | Ht 67.5 in | Wt 154.8 lb

## 2016-03-22 DIAGNOSIS — R0602 Shortness of breath: Secondary | ICD-10-CM | POA: Diagnosis not present

## 2016-03-22 DIAGNOSIS — B4489 Other forms of aspergillosis: Secondary | ICD-10-CM | POA: Diagnosis not present

## 2016-03-22 MED ORDER — PREDNISONE 10 MG PO TABS: ORAL_TABLET | ORAL | 0 refills | Status: DC

## 2016-03-22 NOTE — Patient Instructions (Signed)
Take prednisone as follows: Take 60mg  daily x3 days, then Take 40mg  po daily for 3 days, then take 30mg  po daily for 3 days, then take 20mg  po daily for two days, then take 10mg  po daily for 2 days  Use albuterol 3 times a day for the next 3 days then after that as needed  Stay out of work today, if you need to stay out for the next 3 days that is okay  Follow-up with our nurse practitioner in 10 days  I will plan on seeing you back in 2 months as well

## 2016-03-22 NOTE — Telephone Encounter (Signed)
Pt is in office to see BQ. Will have him address.

## 2016-03-22 NOTE — Assessment & Plan Note (Signed)
Unfortunately Kathryn Griffin is having a flare of asthma which considering her history of an ABPA-like illness I worry she's having this disease flareup again. For now we will treat this as an asthma flare with a stronger dose of prednisone tapering down over the next 10-12 days. I've instructed her to use albuterol 3 times a day for the next 3 days as well.  I'm hoping that a strong, longer than usual taper will be effective, but if she still having symptoms in about 2 weeks then she may need to be treated for 2-3 months with prednisone again as we did in the past.  Plan: Qvar 4 puffs twice a day Prednisone taper prescribed Albuterol 3 times a day for the next 3 days then when necessary after that Follow-up with our nurse practitioner in 10 days, if she's doing well but just complete the taper If however she's not feeling well, then we'll need to prescribe a steady dose of prednisone around 20 mg for 2-3 months Chest x-ray today Follow-up with me in 2 months

## 2016-03-22 NOTE — Progress Notes (Signed)
Subjective:    Patient ID: Kathryn Griffin, female    DOB: Sep 20, 1955, 60 y.o.   MRN: [id number]  Synopsis: 60 yo female developed cough and thick mucus produciton for the first time in her life in 2013. Produced casts with coughing on several occassions and responded to antibiotic and prednisone therapy.  Bronchoscopy in 04/2013 showed mucus plugging/cast LUL. Bronch cultures grew aspergillus species. Baseline GERD.  Methacholine challenge 01/2013 negative.  IgE low/normal 2014. Eosinophil elevated one time in early 2014.  She was treated with itraconazole for a month and did well by January 2015.  HPI Chief Complaint  Patient presents with  . Acute Visit    pt c/o worsening sob, prod cough with very large "globs" of mucus, foul taste in mouth, SOB worse Xseveral days.    Just after our last visit Kathryn Griffin stared feeling a rattle in her chest.  Three days ago she started coughing up large amounts of mucus which she describes as gobs of mucus.  This has been associated with increasing dyspnea. She needed albuterol again for the first time in months. She produced a large "cotton ball" size of mucus it was beige and chunky like prior.  She feels more short of breath now.  No fever or chills, no chest pain.  No sweats or night sweats.     Past Medical History:  Diagnosis Date  . Aspergillus fumigatus ([place]) 11/14  . Reflux   . Vaginal atrophy   . Vitamin D deficiency      Review of Systems  Constitutional: Negative for chills, fatigue and fever.  HENT: Negative for postnasal drip, rhinorrhea and sinus pressure.   Respiratory: Negative for cough, shortness of breath and wheezing.   Cardiovascular: Negative for chest pain, palpitations and leg swelling.         Objective:   Physical Exam Vitals:   03/22/16 0918  BP: 132/78  BP Location: Right Arm  Cuff Size: Normal  Pulse: 76  SpO2: 100%  Weight: 154 lb 12.8 oz (70.2 kg)  Height: 5' 7.5" (1.715 m)  RA  Gen: mildly ill  appearing, no acute distress HEENT: NCAT, EOMi, OP clear PULM: poor air movement, no wheezing CV: RRR, no mgr, no JVD AB: BS+, soft, nontender, no hsm Ext: warm, no edema, no clubbing, no cyanosis  08/02/2013 CXR > normal   04/2013 bronch > large left upper lobe mucus plug/cast causing left upper lobe collapse 04/2013 bronch culture> aspergillus species 05/2013 IgE 156 05/2013 Apergillus precipitans > low positive 07/2013 sputum culture > negative 08/2013 sputum culture > aspergillus sp 01/2014 sputum culture > aspergillus sp    Assessment & Plan:   Infection due to aspergillus fumigatus ([place]) Unfortunately Kathryn Griffin is having a flare of asthma which considering her history of an ABPA-like illness I worry she's having this disease flareup again. For now we will treat this as an asthma flare with a stronger dose of prednisone tapering down over the next 10-12 days. I've instructed her to use albuterol 3 times a day for the next 3 days as well.  I'm hoping that a strong, longer than usual taper will be effective, but if she still having symptoms in about 2 weeks then she may need to be treated for 2-3 months with prednisone again as we did in the past.  Plan: Qvar 4 puffs twice a day Prednisone taper prescribed Albuterol 3 times a day for the next 3 days then when necessary after that Follow-up with our nurse  practitioner in 10 days, if she's doing well but just complete the taper If however she's not feeling well, then we'll need to prescribe a steady dose of prednisone around 20 mg for 2-3 months Chest x-ray today Follow-up with me in 2 months   Updated Medication List Outpatient Encounter Prescriptions as of 03/22/2016  Medication Sig  . albuterol (PROAIR HFA) 108 (90 BASE) MCG/ACT inhaler Inhale 2 puffs into the lungs every 6 (six) hours as needed.  . beclomethasone (QVAR) 80 MCG/ACT inhaler Inhale 2 puffs into the lungs 2 (two) times daily.  . Calcium Citrate (CITRACAL PO) Take 1  tablet by mouth daily at 8 pm.   . Cholecalciferol (VITAMIN D) 2000 units CAPS Take 1 capsule by mouth daily.  Marland Kitchen EPINEPHrine (EPIPEN 2-PAK) 0.3 mg/0.3 mL IJ SOAJ injection Inject 0.3 mLs (0.3 mg total) into the muscle once.  Marland Kitchen estradiol (ESTRACE) 0.1 MG/GM vaginal cream Place [id number] Applicatorfuls vaginally 2 (two) times a week.  . famotidine (PEPCID) 20 MG tablet Take 20 mg by mouth as needed.   . fexofenadine (ALLEGRA) 180 MG tablet Take 180 mg by mouth daily.  . fluticasone (FLONASE) 50 MCG/ACT nasal spray Place 2 sprays into both nostrils daily.   . meloxicam (MOBIC) 15 MG tablet Take 15 mg by mouth daily.  . Multiple Vitamin (MULTIVITAMIN) capsule Take 1 capsule by mouth daily.    Marland Kitchen omeprazole (PRILOSEC) 40 MG capsule Take 40 mg by mouth daily.  Marland Kitchen Spacer/Aero-Holding Chambers (AEROCHAMBER MV) inhaler Use as instructed  . albuterol (PROVENTIL) (2.5 MG/3ML) 0.083% nebulizer solution Take 3 mLs (2.5 mg total) by nebulization every 6 (six) hours as needed for wheezing or shortness of breath. (Patient not taking: Reported on 03/22/2016)  . predniSONE (DELTASONE) 10 MG tablet Use to complete taper, combine with current prescription at home and use per instructions on 9/26 check out sheeet  . predniSONE (STERAPRED UNI-PAK 21 TAB) 10 MG (21) TBPK tablet Take 4 tablets by mouth daily (Patient not taking: Reported on 03/22/2016)   No facility-administered encounter medications on file as of 03/22/2016.

## 2016-03-28 ENCOUNTER — Encounter: Payer: Self-pay | Admitting: Gynecology

## 2016-03-28 ENCOUNTER — Telehealth: Payer: Self-pay | Admitting: Pulmonary Disease

## 2016-03-28 ENCOUNTER — Ambulatory Visit (INDEPENDENT_AMBULATORY_CARE_PROVIDER_SITE_OTHER): Payer: 59 | Admitting: Gynecology

## 2016-03-28 VITALS — BP 120/78 | Ht 67.0 in | Wt 155.0 lb

## 2016-03-28 DIAGNOSIS — Z01419 Encounter for gynecological examination (general) (routine) without abnormal findings: Secondary | ICD-10-CM | POA: Diagnosis not present

## 2016-03-28 DIAGNOSIS — M8580 Other specified disorders of bone density and structure, unspecified site: Secondary | ICD-10-CM

## 2016-03-28 MED ORDER — PREDNISONE 10 MG PO TABS: ORAL_TABLET | ORAL | 0 refills | Status: DC

## 2016-03-28 NOTE — Telephone Encounter (Signed)
Rx has been sent in per JN. Pt is aware. Nothing further was needed.

## 2016-03-28 NOTE — Telephone Encounter (Signed)
Go ahead and send the appropriate quantity to complete her taper. Thanks.

## 2016-03-28 NOTE — Progress Notes (Signed)
Kathryn Griffin 1955/07/26 [id number]   History:    60 y.o.  for annual gyn exam with no complaints today. 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 She is breathing better. Patient reports no menstrual cycle she is on no hormone replacement therapy. She was seen the office in May of this year for postmenopausal bleeding episode had a benign endometrial biopsy and her ultrasound demonstrated a normal-size uterus with endometrial stripe of 2.7 mm both ovaries were normal and she had a small intramural myoma measuring 18 x 11 mm. And a sonohysterogram demonstrated no intracavitary defect. I explained to the supine atrophic bleeding has had no further bleeding so we'll continue to watch for now. Her last bone density study was in 2016 the lowest T score was at the right femoral neck with a value of -2.0 when compared with 2014 it was a statistics significant decrease in bone mineralization of -4%. She had a normal Frax analysis. She stated her colonoscopy is up-to-date and her gastrologist which she saw her recently told her next colonoscopy is due in 2 years. Patient with no previous history of any abnormal Pap smear.   Past medical history,surgical history, family history and social history were all reviewed and documented in the EPIC chart.  Gynecologic History No LMP recorded. Patient is postmenopausal. Contraception: post menopausal status Last Pap: 2017. Results were: normal Last mammogram: 2017. Results were: normal  Obstetric History OB History  Gravida Para Term Preterm AB Living  4 2     2 2   SAB TAB Ectopic Multiple Live Births  2            # Outcome Date GA Lbr Len/2nd Weight Sex Delivery Anes PTL Lv  4 SAB           3 SAB           2 Para           1 Para                ROS: A ROS was performed and pertinent positives and negatives are included in the history.  GENERAL: No fevers or chills. HEENT: No  change in vision, no earache, sore throat or sinus congestion. NECK: No pain or stiffness. CARDIOVASCULAR: No chest pain or pressure. No palpitations. PULMONARY: No shortness of breath, cough or wheeze. GASTROINTESTINAL: No abdominal pain, nausea, vomiting or diarrhea, melena or bright red blood per rectum. GENITOURINARY: No urinary frequency, urgency, hesitancy or dysuria. MUSCULOSKELETAL: No joint or muscle pain, no back pain, no recent trauma. DERMATOLOGIC: No rash, no itching, no lesions. ENDOCRINE: No polyuria, polydipsia, no heat or cold intolerance. No recent change in weight. HEMATOLOGICAL: No anemia or easy bruising or bleeding. NEUROLOGIC: No headache, seizures, numbness, tingling or weakness. PSYCHIATRIC: No depression, no loss of interest in normal activity or change in sleep pattern.     Exam: chaperone present  BP 120/78   Ht 5\' 7"  (1.702 m)   Wt 155 lb (70.3 kg)   BMI 24.28 kg/m   Body mass index is 24.28 kg/m.  General appearance : Well developed well nourished female. No acute distress HEENT: Eyes: no retinal hemorrhage or exudates,  Neck supple, trachea midline, no carotid bruits, no thyroidmegaly Lungs: Clear to auscultation, no rhonchi or wheezes, or rib retractions  Heart: Regular rate and rhythm, no murmurs or gallops Breast:Examined in sitting and supine position were symmetrical in appearance, no  palpable masses or tenderness,  no skin retraction, no nipple inversion, no nipple discharge, no skin discoloration, no axillary or supraclavicular lymphadenopathy Abdomen: no palpable masses or tenderness, no rebound or guarding Extremities: no edema or skin discoloration or tenderness  Pelvic:  Bartholin, Urethra, Skene Glands: Within normal limits             Vagina: No gross lesions or discharge  Cervix: No gross lesions or discharge  Uterus  anteverted, normal size, shape and consistency, non-tender and mobile  Adnexa  Without masses or tenderness  Anus and perineum   normal   Rectovaginal  normal sphincter tone without palpated masses or tenderness             Hemoccult cards will be provided     Assessment/Plan:  60 y.o. female for annual exam who is postmenopausal has had no further be bleeding since early this year had a negative workup. Pap smear was done also April this year which was normal. Patient currently being treated with steroids for pulmonary infection. She is on her calcium and vitamin D and has history of osteopenia due for her next bone density study next year. She was reminded importance of calcium vitamin D and weightbearing exercises for osteoporosis prevention. Also discussed importance of monthly breast exam. Because of her pulmonary infection right now and her been on prednisone the flu vaccine was put on hold for now. We are going to check a vitamin D level today because of her history of osteopenia and decreased bone mass. Her PCP we'll be doing her blood work.   Terrance Mass MD, 4:38 PM 03/28/2016

## 2016-03-28 NOTE — Telephone Encounter (Signed)
Spoke with pt and she states that she was only given #35 prednisone tablets when the taper is set for #45.  LOV 03/22/16 Take prednisone as follows: Take 60mg  daily x3 days, then Take 40mg  po daily for 3 days, then take 30mg  po daily for 3 days, then take 20mg  po daily for two days, then take 10mg  po daily for 2 days = #45tabs   predniSONE (STERAPRED UNI-PAK 21 TAB) 10 MG (21) TBPK tablet [id number]  Dose, Route, Frequency: As Directed   Dispense Quantity:  30 tablet Refills:  0 Fills remaining:  --      predniSONE (DELTASONE) 10 MG tablet [id number]  Dose, Route, Frequency: As Directed   Dispense Quantity:  5 tablet Refills:  0 Fills remaining:  --        Sig: Use to complete taper, combine with current prescription at home and use per instructions on 9/26 check out sheeet      BQ is out of office. JN - Please advise if ok to send remaining tablets. Thanks!

## 2016-03-28 NOTE — Telephone Encounter (Signed)
lmtcb x1 for pt. 

## 2016-03-29 LAB — VITAMIN D 25 HYDROXY (VIT D DEFICIENCY, FRACTURES): Vit D, 25-Hydroxy: 37 ng/mL (ref 30–100)

## 2016-04-01 ENCOUNTER — Encounter: Payer: Self-pay | Admitting: Adult Health

## 2016-04-01 ENCOUNTER — Ambulatory Visit: Payer: 59 | Admitting: Adult Health

## 2016-04-01 ENCOUNTER — Ambulatory Visit (INDEPENDENT_AMBULATORY_CARE_PROVIDER_SITE_OTHER): Payer: 59 | Admitting: Adult Health

## 2016-04-01 DIAGNOSIS — B4489 Other forms of aspergillosis: Secondary | ICD-10-CM

## 2016-04-01 NOTE — Patient Instructions (Signed)
Taper steroids as directed.  Continue on current regimen  Follow up Dr. Lake Bells in 2 months and As needed

## 2016-04-01 NOTE — Progress Notes (Signed)
Subjective:    Patient ID: Kathryn Griffin, female    DOB: Jul 18, 1955, 60 y.o.   MRN: [id number]  HPI Synopsis: 60 yo female developed cough and thick mucus produciton for the first time in her life in 2013. Produced casts with coughing on several occassions and responded to antibiotic and prednisone therapy.  Bronchoscopy in 04/2013 showed mucus plugging/cast LUL. Bronch cultures grew aspergillus species. Baseline GERD.  Methacholine challenge 01/2013 negative.  IgE low/normal 2014. Eosinophil elevated one time in early 2014.  She was treated with itraconazole for a month and did well by January 2015.  04/01/2016 Follow up : Asthma flare  Pt returns for a 2 week follow up . She was seen last ov with asthma like flare vs ABPA like illness flare . She was treated with steroids . Says it took a few days to start working but now feels much better and back to her baseline. Has few days left on steroid taper. Denies flare of cough or wheezing . No fever, chest pain, orthopnea, edema or hemoptysis .  CXR last ov with no acute process .    Past Medical History:  Diagnosis Date  . Aspergillus fumigatus ([place]) 11/14  . Reflux   . Vaginal atrophy   . Vitamin D deficiency    Current Outpatient Prescriptions on File Prior to Visit  Medication Sig Dispense Refill  . albuterol (PROAIR HFA) 108 (90 BASE) MCG/ACT inhaler Inhale 2 puffs into the lungs every 6 (six) hours as needed.    Marland Kitchen albuterol (PROVENTIL) (2.5 MG/3ML) 0.083% nebulizer solution Take 3 mLs (2.5 mg total) by nebulization every 6 (six) hours as needed for wheezing or shortness of breath. 75 mL 12  . beclomethasone (QVAR) 80 MCG/ACT inhaler Inhale 2 puffs into the lungs 2 (two) times daily. 8.7 g 11  . Calcium Citrate (CITRACAL PO) Take 1 tablet by mouth daily at 8 pm.     . Cholecalciferol (VITAMIN D) 2000 units CAPS Take 1 capsule by mouth daily.    Marland Kitchen EPINEPHrine (EPIPEN 2-PAK) 0.3 mg/0.3 mL IJ SOAJ injection Inject 0.3 mLs (0.3 mg total)  into the muscle once. 1 Device 0  . estradiol (ESTRACE) 0.1 MG/GM vaginal cream Place [id number] Applicatorfuls vaginally 2 (two) times a week. 42.5 g 8  . famotidine (PEPCID) 20 MG tablet Take 20 mg by mouth as needed.     . fexofenadine (ALLEGRA) 180 MG tablet Take 180 mg by mouth daily.    . fluticasone (FLONASE) 50 MCG/ACT nasal spray Place 2 sprays into both nostrils daily.     . meloxicam (MOBIC) 15 MG tablet Take 15 mg by mouth daily.    . Multiple Vitamin (MULTIVITAMIN) capsule Take 1 capsule by mouth daily.      Marland Kitchen omeprazole (PRILOSEC) 40 MG capsule Take 40 mg by mouth daily.    . predniSONE (DELTASONE) 10 MG tablet Use to complete taper, combine with current prescription at home and use per instructions on 9/26 check out sheeet 10 tablet 0  . Spacer/Aero-Holding Chambers (AEROCHAMBER MV) inhaler Use as instructed 1 each 0   No current facility-administered medications on file prior to visit.      Review of Systems Constitutional:   No  weight loss, night sweats,  Fevers, chills, fatigue, or  lassitude.  HEENT:   No headaches,  Difficulty swallowing,  Tooth/dental problems, or  Sore throat,                No sneezing, itching, ear  ache, nasal congestion, post nasal drip,   CV:  No chest pain,  Orthopnea, PND, swelling in lower extremities, anasarca, dizziness, palpitations, syncope.   GI  No heartburn, indigestion, abdominal pain, nausea, vomiting, diarrhea, change in bowel habits, loss of appetite, bloody stools.   Resp: No shortness of breath with exertion or at rest.  No excess mucus, no productive cough,  No non-productive cough,  No coughing up of blood.  No change in color of mucus.  No wheezing.  No chest wall deformity  Skin: no rash or lesions.  GU: no dysuria, change in color of urine, no urgency or frequency.  No flank pain, no hematuria   MS:  No joint pain or swelling.  No decreased range of motion.  No back pain.  Psych:  No change in mood or affect. No depression or  anxiety.  No memory loss.         Objective:   Physical Exam   , Vitals:   04/01/16 1606  BP: 98/62  Pulse: 69  SpO2: 98%  Weight: 157 lb 6.4 oz (71.4 kg)  Height: 5' 7.5" (1.715 m)    GEN: A/Ox3; pleasant , NAD, well nourished    HEENT:  [place]/AT,  EACs-clear, TMs-wnl, NOSE-clear, THROAT-clear, no lesions, no postnasal drip or exudate noted.   NECK:  Supple w/ fair ROM; no JVD; normal carotid impulses w/o bruits; no thyromegaly or nodules palpated; no lymphadenopathy.    RESP  Clear  P & A; w/o, wheezes/ rales/ or rhonchi. no accessory muscle use, no dullness to percussion  CARD:  RRR, no m/r/g  , no peripheral edema, pulses intact, no cyanosis or clubbing.  GI:   Soft & nt; nml bowel sounds; no organomegaly or masses detected.   Musco: Warm bil, no deformities or joint swelling noted.   Neuro: alert, no focal deficits noted.    Skin: Warm, no lesions or rashes  .Tammy Parrett NP-C  [facility]  04/01/2016        Assessment & Plan:

## 2016-04-01 NOTE — Assessment & Plan Note (Signed)
Recent flare with asthma  Resolved with steroid course  Plan  Patient Instructions  Taper steroids as directed.  Continue on current regimen  Follow up Dr. Lake Bells in 2 months and As needed

## 2016-04-27 ENCOUNTER — Other Ambulatory Visit: Payer: Self-pay | Admitting: Anesthesiology

## 2016-04-27 DIAGNOSIS — Z1211 Encounter for screening for malignant neoplasm of colon: Secondary | ICD-10-CM

## 2016-05-10 ENCOUNTER — Other Ambulatory Visit: Payer: Self-pay | Admitting: Gynecology

## 2016-05-24 ENCOUNTER — Encounter: Payer: Self-pay | Admitting: Pulmonary Disease

## 2016-05-24 ENCOUNTER — Ambulatory Visit (INDEPENDENT_AMBULATORY_CARE_PROVIDER_SITE_OTHER): Payer: 59 | Admitting: Pulmonary Disease

## 2016-05-24 DIAGNOSIS — R059 Cough, unspecified: Secondary | ICD-10-CM

## 2016-05-24 DIAGNOSIS — B4489 Other forms of aspergillosis: Secondary | ICD-10-CM

## 2016-05-24 DIAGNOSIS — R05 Cough: Secondary | ICD-10-CM

## 2016-05-24 MED ORDER — BUDESONIDE-FORMOTEROL FUMARATE 160-4.5 MCG/ACT IN AERO: 2.0000 | INHALATION_SPRAY | Freq: Two times a day (BID) | RESPIRATORY_TRACT | 0 refills | Status: DC

## 2016-05-24 MED ORDER — AEROCHAMBER MV MISC: 0 refills | Status: DC

## 2016-05-24 MED ORDER — PREDNISONE 10 MG PO TABS: ORAL_TABLET | ORAL | 0 refills | Status: DC

## 2016-05-24 NOTE — Assessment & Plan Note (Signed)
Laryngeal irritation due to persistent cough is perpetuating her cough. I advised voice rest today.

## 2016-05-24 NOTE — Patient Instructions (Signed)
Take the Symbicort 2 puffs twice a day in addition to the Qvar 4 puffs twice a day Be sure to rinse her mouth after using these inhaled medicines Use the inhaled medicines with a spacer If you still have symptoms of chest tightness and congestion after one week of using the Symbicort you should take the prednisone taper I have prescribed If you continue to have symptoms of throat scratchiness, hacking cough then call me and I will recommend that you stay out of work for a few days and rest your voice Otherwise, I will plan on seeing you back in 2 months or sooner if needed

## 2016-05-24 NOTE — Assessment & Plan Note (Signed)
Unfortunately Kathryn Griffin is having another flare of her asthma related to chronic colonization with aspergillus.  She is very reluctant to go back on prednisone again. She's been somewhat responsive to inhaled corticosteroids for control of her disease in the past.  To help control her disease I'm going to add Symbicort in addition to Qvar. Qvar provides a small particle inhaled steroid and Symbicort will provide a larger particle size as well as long-acting beta agonist. If after taking this medicine for one week she does not have improvement then I've asked her to take a prednisone taper which I have provided her in written form today. If she does not respond to this regimen she'll need to be treated with prednisone for 2-3 months again at a dose of 30 mg daily initially.  > 50% of this 28 minute visit spent face to face

## 2016-05-24 NOTE — Progress Notes (Signed)
Subjective:    Patient ID: Kathryn Griffin, female    DOB: 10/28/1955, 60 y.o.   MRN: [id number]  Synopsis: 60 yo female developed cough and thick mucus produciton for the first time in her life in 2013. Produced casts with coughing on several occassions and responded to antibiotic and prednisone therapy.  Bronchoscopy in 04/2013 showed mucus plugging/cast LUL. Bronch cultures grew aspergillus species. Baseline GERD.  Methacholine challenge 01/2013 negative.  IgE low/normal 2014. Eosinophil elevated one time in early 2014.  She was treated with itraconazole for a month and did well by January 2015.  HPI Chief Complaint  Patient presents with  . Follow-up    pt c/o increased SOB, chest tightness, prod cough with brown mucus since 05/03/2016   [name] says she started coughing up mucus again on 11/7 roughly and it hasn't started since then.  She has been feeling more chest congestion but apparently not a lot.  Last weekend she felt a lot of congestion so she used her flutter valve and her nebulizer.  She said that things moved around but it didn't come out.  She feels tightness in her chest and some wheezing.  Some dyspnea.  She can feel some problems in her throat with laryngeal irritation.  She has been coughing more during the daytime.  She denies fevers, chills, body aches.  QVar is 4 puffs twice a day. She is bringing up mucus, but not a lot.     Past Medical History:  Diagnosis Date  . Aspergillus fumigatus ([place]) 11/14  . Reflux   . Vaginal atrophy   . Vitamin D deficiency      Review of Systems  Constitutional: Negative for chills, fatigue and fever.  HENT: Negative for postnasal drip, rhinorrhea and sinus pressure.   Respiratory: Positive for cough, shortness of breath and wheezing.   Cardiovascular: Negative for chest pain, palpitations and leg swelling.         Objective:   Physical Exam Vitals:   05/24/16 1618  BP: 126/74  BP Location: Left Arm  Cuff Size: Normal   Pulse: 77  Temp: 97.7 F (36.5 C)  TempSrc: Oral  SpO2: 100%  Weight: 157 lb 9.6 oz (71.5 kg)  Height: 5' 7.5" (1.715 m)  RA  Gen: mildly ill appearing, no acute distress HEENT: NCAT, EOMi, OP clear PULM: CTA B today, no wheezing CV: RRR, no mgr, no JVD AB: BS+, soft, nontender, no hsm Ext: warm, no edema, no clubbing, no cyanosis  08/02/2013 CXR > normal   04/2013 bronch > large left upper lobe mucus plug/cast causing left upper lobe collapse 04/2013 bronch culture> aspergillus species 05/2013 IgE 156 05/2013 Apergillus precipitans > low positive 07/2013 sputum culture > negative 08/2013 sputum culture > aspergillus sp 01/2014 sputum culture > aspergillus sp    Assessment & Plan:   Infection due to aspergillus fumigatus ([place]) Unfortunately [name] is having another flare of her asthma related to chronic colonization with aspergillus.  She is very reluctant to go back on prednisone again. She's been somewhat responsive to inhaled corticosteroids for control of her disease in the past.  To help control her disease I'm going to add Symbicort in addition to Qvar. Qvar provides a small particle inhaled steroid and Symbicort will provide a larger particle size as well as long-acting beta agonist. If after taking this medicine for one week she does not have improvement then I've asked her to take a prednisone taper which I have provided her in written  form today. If she does not respond to this regimen she'll need to be treated with prednisone for 2-3 months again at a dose of 30 mg daily initially.  > 50% of this 28 minute visit spent face to face  Cough Laryngeal irritation due to persistent cough is perpetuating her cough. I advised voice rest today.   Updated Medication List Outpatient Encounter Prescriptions as of 05/24/2016  Medication Sig  . albuterol (PROAIR HFA) 108 (90 BASE) MCG/ACT inhaler Inhale 2 puffs into the lungs every 6 (six) hours as needed.  Marland Kitchen albuterol  (PROVENTIL) (2.5 MG/3ML) 0.083% nebulizer solution Take 3 mLs (2.5 mg total) by nebulization every 6 (six) hours as needed for wheezing or shortness of breath.  . beclomethasone (QVAR) 80 MCG/ACT inhaler Inhale 2 puffs into the lungs 2 (two) times daily.  . Calcium Citrate (CITRACAL PO) Take 1 tablet by mouth daily at 8 pm.   . Cholecalciferol (VITAMIN D) 2000 units CAPS Take 1 capsule by mouth daily.  Marland Kitchen EPINEPHrine (EPIPEN 2-PAK) 0.3 mg/0.3 mL IJ SOAJ injection Inject 0.3 mLs (0.3 mg total) into the muscle once.  Marland Kitchen ESTRACE VAGINAL 0.1 MG/GM vaginal cream PLACE [id number] APPLICATORFULS VAGINALLY TWICE A WEEK  . famotidine (PEPCID) 20 MG tablet Take 20 mg by mouth as needed.   . fexofenadine (ALLEGRA) 180 MG tablet Take 180 mg by mouth daily.  . fluticasone (FLONASE) 50 MCG/ACT nasal spray Place 2 sprays into both nostrils daily.   . meloxicam (MOBIC) 15 MG tablet Take 15 mg by mouth daily.  . Multiple Vitamin (MULTIVITAMIN) capsule Take 1 capsule by mouth daily.    Marland Kitchen omeprazole (PRILOSEC) 40 MG capsule Take 40 mg by mouth daily.  Marland Kitchen Spacer/Aero-Holding Chambers (AEROCHAMBER MV) inhaler Use as instructed  . [DISCONTINUED] predniSONE (DELTASONE) 10 MG tablet Use to complete taper, combine with current prescription at home and use per instructions on 9/26 check out sheeet  . budesonide-formoterol (SYMBICORT) 160-4.5 MCG/ACT inhaler Inhale 2 puffs into the lungs 2 (two) times daily.  . predniSONE (DELTASONE) 10 MG tablet Take 40mg  po daily for 3 days, then take 30mg  po daily for 3 days, then take 20mg  po daily for two days, then take 10mg  po daily for 2 days  . Spacer/Aero-Holding Chambers (AEROCHAMBER MV) inhaler Use as instructed   No facility-administered encounter medications on file as of 05/24/2016.

## 2016-05-31 ENCOUNTER — Telehealth: Payer: Self-pay | Admitting: Internal Medicine

## 2016-05-31 ENCOUNTER — Telehealth: Payer: Self-pay | Admitting: Pulmonary Disease

## 2016-05-31 NOTE — Telephone Encounter (Signed)
Called and spoke with pt and she stated that the symbicort is not working for her.  She stated that last night she started with a mucus plug in her lung and she had a very hard time to get this to move.  She stated that she did take the albuterol and she did start back on the prednisone.  She wanted to call and make BQ aware and to see if anything further is needed.  She did stop the symbicort.  Please advise. Thanks  Allergies  Allergen Reactions  . Molds & Smuts Shortness Of Breath  . Dexilant [Dexlansoprazole] Anxiety  . Protonix [Pantoprazole Sodium] Anxiety

## 2016-06-01 NOTE — Telephone Encounter (Signed)
BQ  Spoke with pt. And gave her your message. She stated that she started the prednisone taper yesterday and will finish it. She just wanted to make you aware that she finally got the mucus plug out and it was the size of a cotton ball.

## 2016-06-01 NOTE — Telephone Encounter (Signed)
Find out when she started the prednisone.  If within 48 hours then she should continue the prednisone taper.  If no improvement then f/u.  However if she has been on the prednisone for 4-5 days then she needs an OV now.  OK to stay off of symbicort.

## 2016-06-07 NOTE — Telephone Encounter (Signed)
Error. Disregard

## 2016-06-09 ENCOUNTER — Telehealth: Payer: Self-pay | Admitting: Pulmonary Disease

## 2016-06-09 MED ORDER — PREDNISONE 10 MG PO TABS: ORAL_TABLET | ORAL | 0 refills | Status: DC

## 2016-06-09 NOTE — Telephone Encounter (Signed)
LMTCB

## 2016-06-09 NOTE — Telephone Encounter (Signed)
Spoke with pt, c/o prod cough with brown/green mucus, increased SOB X4-5 days.    Denies fever, "feeling bad", sinus congestion.   Pt uses [facility] on [address].    BQ please advise on recs.  Thanks!

## 2016-06-09 NOTE — Telephone Encounter (Signed)
Prednisone 30mg  daily x2 weeks, then 20mg  daily x2 weeks f/u with me or NP in 4 weeks Will likely need prolonged prednisone again

## 2016-06-09 NOTE — Telephone Encounter (Signed)
Patient is returning phone call. Patient request to leave a detailed message on voicemail.

## 2016-06-09 NOTE — Telephone Encounter (Signed)
Spoke with pt. She is aware of BQ's recommendations. Rx has been sent in. Pt states that she will call back to make ROV. Nothing further was needed.

## 2016-06-10 ENCOUNTER — Telehealth: Payer: Self-pay | Admitting: Pulmonary Disease

## 2016-06-10 NOTE — Telephone Encounter (Signed)
Called by Kathryn Griffin who is concerned that she has used her Qvar inhaler and can't get a refill for 6 days. However, she is currently on oral Prednisone Rx. Assured the patient that the inhaled steroid, Qvar, will not significantly add to her Rx as she is already on systemic steroid Rx with Prednisone. She will call the office if she fails to improve on her current regime.

## 2016-06-13 MED ORDER — BECLOMETHASONE DIPROPIONATE 80 MCG/ACT IN AERS: 2.0000 | INHALATION_SPRAY | Freq: Two times a day (BID) | RESPIRATORY_TRACT | 0 refills | Status: DC

## 2016-06-13 NOTE — Addendum Note (Signed)
Addended by: Len Blalock on: 06/13/2016 03:19 PM   Modules accepted: Orders

## 2016-06-13 NOTE — Telephone Encounter (Signed)
Please offer a QVar sample at front desk.  54mcg Thanks

## 2016-06-13 NOTE — Telephone Encounter (Signed)
Spoke with pt, would in fact like a qvar sample.  This has been placed up front for pickup.  Nothing further needed.

## 2016-07-25 ENCOUNTER — Encounter: Payer: Self-pay | Admitting: Pulmonary Disease

## 2016-07-25 ENCOUNTER — Ambulatory Visit (INDEPENDENT_AMBULATORY_CARE_PROVIDER_SITE_OTHER): Payer: 59 | Admitting: Pulmonary Disease

## 2016-07-25 DIAGNOSIS — B4489 Other forms of aspergillosis: Secondary | ICD-10-CM

## 2016-07-25 NOTE — Patient Instructions (Signed)
Take Qvar 4 puffs twice a day for the next 4 weeks, then 3 puffs twice a day for 4 weeks after that, then 2 puffs twice a day I will see back in 2 months or sooner if needed

## 2016-07-25 NOTE — Assessment & Plan Note (Signed)
Kathryn Griffin has asthmatic bronchitis exacerbated by what I believe is chronic colonization from Aspergillus fumigatus. Fortunately she recovered after a 4 week long course of prednisone most recently. Today on exam her lungs are clear and she is asymptomatic. I'm hopeful that week and go back to controlling her disease with inhaled corticosteroids alone.  Plan: Qvar 4 puffs twice a day for 4 weeks, then 3 puffs twice a day for 4 weeks, then 2 puffs twice a day until she sees me next Follow-up 2 months

## 2016-07-25 NOTE — Progress Notes (Signed)
Subjective:    Patient ID: Kathryn Griffin, female    DOB: 11-19-1955, 61 y.o.   MRN: [id number]  Synopsis: 61 yo female developed cough and thick mucus produciton for the first time in her life in 2013. Produced casts with coughing on several occassions and responded to antibiotic and prednisone therapy.  Bronchoscopy in 04/2013 showed mucus plugging/cast LUL. Bronch cultures grew aspergillus species. Baseline GERD.  Methacholine challenge 01/2013 negative.  IgE low/normal 2014. Eosinophil elevated one time in early 2014.  She was treated with itraconazole for a month and did well by January 2015.  HPI Chief Complaint  Patient presents with  . Follow-up    pt doing well, no complaints today.    Glenadine stopped taking prednisone about two weeks ago and has been feeling better since early in her course of prednisone.   No cough right now, no wheezing, no dyspnea.  In general she has done well. She is still taking QVar 4 twice a day and she is doing well on that.  No side effects.  Past Medical History:  Diagnosis Date  . Aspergillus fumigatus ([place]) 11/14  . Reflux   . Vaginal atrophy   . Vitamin D deficiency      Review of Systems  Constitutional: Negative for chills, fatigue and fever.  HENT: Negative for postnasal drip, rhinorrhea and sinus pressure.   Respiratory: Positive for cough, shortness of breath and wheezing.   Cardiovascular: Negative for chest pain, palpitations and leg swelling.         Objective:   Physical Exam Vitals:   07/25/16 1618  BP: 116/68  BP Location: Left Arm  Cuff Size: Normal  Pulse: 63  SpO2: 99%  Weight: 159 lb (72.1 kg)  Height: 5' 7.5" (1.715 m)  RA  Gen: well appearing HENT: OP clear, TM's clear, neck supple PULM: CTA B, normal percussion CV: RRR, no mgr, trace edema GI: BS+, soft, nontender Derm: no cyanosis or rash Psyche: normal mood and affect   08/02/2013 CXR > normal   04/2013 bronch > large left upper lobe mucus plug/cast  causing left upper lobe collapse 04/2013 bronch culture> aspergillus species 05/2013 IgE 156 05/2013 Apergillus precipitans > low positive 07/2013 sputum culture > negative 08/2013 sputum culture > aspergillus sp 01/2014 sputum culture > aspergillus sp    Assessment & Plan:   Infection due to aspergillus fumigatus ([place]) [name] has asthmatic bronchitis exacerbated by what I believe is chronic colonization from Aspergillus fumigatus. Fortunately she recovered after a 4 week long course of prednisone most recently. Today on exam her lungs are clear and she is asymptomatic. I'm hopeful that week and go back to controlling her disease with inhaled corticosteroids alone.  Plan: Qvar 4 puffs twice a day for 4 weeks, then 3 puffs twice a day for 4 weeks, then 2 puffs twice a day until she sees me next Follow-up 2 months   Updated Medication List Outpatient Encounter Prescriptions as of 07/25/2016  Medication Sig  . albuterol (PROAIR HFA) 108 (90 BASE) MCG/ACT inhaler Inhale 2 puffs into the lungs every 6 (six) hours as needed.  Marland Kitchen albuterol (PROVENTIL) (2.5 MG/3ML) 0.083% nebulizer solution Take 3 mLs (2.5 mg total) by nebulization every 6 (six) hours as needed for wheezing or shortness of breath.  . beclomethasone (QVAR) 80 MCG/ACT inhaler Inhale 2 puffs into the lungs 2 (two) times daily.  . beclomethasone (QVAR) 80 MCG/ACT inhaler Inhale 2 puffs into the lungs 2 (two) times daily.  Marland Kitchen  Calcium Citrate (CITRACAL PO) Take 1 tablet by mouth daily at 8 pm.   . Cholecalciferol (VITAMIN D) 2000 units CAPS Take 1 capsule by mouth daily.  Marland Kitchen EPINEPHrine (EPIPEN 2-PAK) 0.3 mg/0.3 mL IJ SOAJ injection Inject 0.3 mLs (0.3 mg total) into the muscle once.  Marland Kitchen ESTRACE VAGINAL 0.1 MG/GM vaginal cream PLACE [id number] APPLICATORFULS VAGINALLY TWICE A WEEK  . famotidine (PEPCID) 20 MG tablet Take 20 mg by mouth as needed.   . fexofenadine (ALLEGRA) 180 MG tablet Take 180 mg by mouth daily.  . fluticasone (FLONASE) 50  MCG/ACT nasal spray Place 2 sprays into both nostrils daily.   . meloxicam (MOBIC) 15 MG tablet Take 15 mg by mouth daily.  . Multiple Vitamin (MULTIVITAMIN) capsule Take 1 capsule by mouth daily.    Marland Kitchen omeprazole (PRILOSEC) 40 MG capsule Take 40 mg by mouth daily.  Marland Kitchen Spacer/Aero-Holding Chambers (AEROCHAMBER MV) inhaler Use as instructed  . [DISCONTINUED] Spacer/Aero-Holding Chambers (AEROCHAMBER MV) inhaler Use as instructed  . [DISCONTINUED] budesonide-formoterol (SYMBICORT) 160-4.5 MCG/ACT inhaler Inhale 2 puffs into the lungs 2 (two) times daily. (Patient not taking: Reported on 07/25/2016)  . [DISCONTINUED] predniSONE (DELTASONE) 10 MG tablet Take 3 tabs daily for 2 weeks, then 2 tabs daily for 2 weeks (Patient not taking: Reported on 07/25/2016)   No facility-administered encounter medications on file as of 07/25/2016.

## 2016-07-28 ENCOUNTER — Other Ambulatory Visit: Payer: Self-pay | Admitting: Pulmonary Disease

## 2016-07-29 ENCOUNTER — Telehealth: Payer: Self-pay | Admitting: Pulmonary Disease

## 2016-07-29 MED ORDER — PREDNISONE 10 MG PO TABS: ORAL_TABLET | ORAL | 0 refills | Status: DC

## 2016-07-29 NOTE — Telephone Encounter (Signed)
Pt aware of BQ's recommendations and voiced her understanding. Rx sent to preferred pharmacy. Nothing further needed.

## 2016-07-29 NOTE — Telephone Encounter (Signed)
Spoke with pt. States that she is having a flare up. Reports cough, SOB. Cough is producing brown and foul smelling mucus. Denies chest tightness, wheezing or fever. States that her symptoms started yesterday. She has been instructed by BQ to always call once her symptoms start. Pt would like to have prednisone sent to the pharmacy.  BQ - please advise. Thanks.

## 2016-07-29 NOTE — Telephone Encounter (Signed)
Yes, agree with prednisone Take 40mg  po daily for 3 days, then take 30mg  po daily for 3 days, then take 20mg  po daily for two days, then take 10mg  po daily for 2 days

## 2016-08-12 ENCOUNTER — Telehealth: Payer: Self-pay | Admitting: Pulmonary Disease

## 2016-08-12 NOTE — Telephone Encounter (Signed)
lmtcb X1 

## 2016-08-12 NOTE — Telephone Encounter (Signed)
No samples of Qvar 40 or 80  I offered to send in rx and she declined  Nothing further needed per pt

## 2016-08-13 ENCOUNTER — Telehealth: Payer: Self-pay | Admitting: Pulmonary Disease

## 2016-08-13 NOTE — Telephone Encounter (Signed)
Called by Suanne Marker who voices that she is out of QVAR d/t Dr. [place] Bells increasing her QVAR dose. Will call in a prescription for QVAR 80 mcg/act, Disp: #1, Sig: 4 puffs BID. Refill X 2.

## 2016-08-25 DIAGNOSIS — H1031 Unspecified acute conjunctivitis, right eye: Secondary | ICD-10-CM | POA: Diagnosis not present

## 2016-08-29 DIAGNOSIS — H15101 Unspecified episcleritis, right eye: Secondary | ICD-10-CM | POA: Diagnosis not present

## 2016-09-06 DIAGNOSIS — J029 Acute pharyngitis, unspecified: Secondary | ICD-10-CM | POA: Diagnosis not present

## 2016-09-22 ENCOUNTER — Ambulatory Visit: Payer: 59 | Admitting: Pulmonary Disease

## 2016-09-22 DIAGNOSIS — M7061 Trochanteric bursitis, right hip: Secondary | ICD-10-CM | POA: Diagnosis not present

## 2016-09-26 ENCOUNTER — Telehealth: Payer: Self-pay | Admitting: Pulmonary Disease

## 2016-09-26 MED ORDER — PREDNISONE 10 MG PO TABS: ORAL_TABLET | ORAL | 0 refills | Status: DC

## 2016-09-26 NOTE — Telephone Encounter (Signed)
Spoke with pt, aware of recs.  rx sent to preferred pharmacy.  Nothing further needed.  

## 2016-09-26 NOTE — Telephone Encounter (Signed)
Give prednisone 40 mg daily. Reduce dose by 10 mg every 2 days.

## 2016-09-26 NOTE — Telephone Encounter (Signed)
Spoke with pt, states she is not feeling ill at this time but has had 2 episodes in the past 8 days where she has coughed up 2 yellow/gray/green plugs of mucus (on 3/25 and on 3/30).  Pt is travelling to [place] on Wednesday and is requesting a rx of prednisone to take in case her symptoms worsen while out of town.    Denies fever, chest pain, sinus congestion, PND, runny nose.  Uses [facility] on [address] to [place] as BQ is working [employer] operational [employer].  PM please advise on recs.  Thanks.

## 2016-10-11 ENCOUNTER — Other Ambulatory Visit (INDEPENDENT_AMBULATORY_CARE_PROVIDER_SITE_OTHER): Payer: 59

## 2016-10-11 ENCOUNTER — Ambulatory Visit (INDEPENDENT_AMBULATORY_CARE_PROVIDER_SITE_OTHER): Payer: 59 | Admitting: Pulmonary Disease

## 2016-10-11 ENCOUNTER — Encounter: Payer: Self-pay | Admitting: Pulmonary Disease

## 2016-10-11 VITALS — BP 112/70 | HR 79 | Ht 67.5 in | Wt 159.4 lb

## 2016-10-11 DIAGNOSIS — J45909 Unspecified asthma, uncomplicated: Secondary | ICD-10-CM | POA: Diagnosis not present

## 2016-10-11 DIAGNOSIS — B4489 Other forms of aspergillosis: Secondary | ICD-10-CM

## 2016-10-11 DIAGNOSIS — M47812 Spondylosis without myelopathy or radiculopathy, cervical region: Secondary | ICD-10-CM | POA: Diagnosis not present

## 2016-10-11 DIAGNOSIS — M542 Cervicalgia: Secondary | ICD-10-CM | POA: Diagnosis not present

## 2016-10-11 LAB — CBC WITH DIFFERENTIAL/PLATELET
Basophils Absolute: 0.1 10*3/uL (ref 0.0–0.1)
Basophils Relative: 0.7 % (ref 0.0–3.0)
Eosinophils Absolute: 0.2 10*3/uL (ref 0.0–0.7)
Eosinophils Relative: 2.4 % (ref 0.0–5.0)
HCT: 42 % (ref 36.0–46.0)
Hemoglobin: 13.9 g/dL (ref 12.0–15.0)
Lymphocytes Relative: 20 % (ref 12.0–46.0)
Lymphs Abs: 1.4 10*3/uL (ref 0.7–4.0)
MCHC: 33.2 g/dL (ref 30.0–36.0)
MCV: 92.3 fl (ref 78.0–100.0)
Monocytes Absolute: 0.8 10*3/uL (ref 0.1–1.0)
Monocytes Relative: 10.6 % (ref 3.0–12.0)
Neutro Abs: 4.7 10*3/uL (ref 1.4–7.7)
Neutrophils Relative %: 66.3 % (ref 43.0–77.0)
Platelets: 360 10*3/uL (ref 150.0–400.0)
RBC: 4.55 Mil/uL (ref 3.87–5.11)
RDW: 13.4 % (ref 11.5–15.5)
WBC: 7.2 10*3/uL (ref 4.0–10.5)

## 2016-10-11 MED ORDER — BECLOMETHASONE DIPROPIONATE 80 MCG/ACT IN AERS: 4.0000 | INHALATION_SPRAY | Freq: Two times a day (BID) | RESPIRATORY_TRACT | 11 refills | Status: DC

## 2016-10-11 MED ORDER — SODIUM CHLORIDE 3 % IN NEBU: INHALATION_SOLUTION | Freq: Two times a day (BID) | RESPIRATORY_TRACT | 11 refills | Status: DC

## 2016-10-11 NOTE — Addendum Note (Signed)
Addended by: Len Blalock on: 10/11/2016 04:45 PM   Modules accepted: Orders

## 2016-10-11 NOTE — Patient Instructions (Signed)
Keep taking Qvar 2 puffs twice a day We will refill the Qvar We will add a prescription for something called hypertonic saline which he can use twice a day to help when you had increasing chest congestion We will call you with the results of today's blood work Follow-up 6 months or sooner if needed

## 2016-10-11 NOTE — Assessment & Plan Note (Signed)
She had a flareup earlier this spring but she is now improving. We have learned this round that she can treat with more aggressive mucociliary clearance measures without having to take a steroid. However, she continues to need an inhaled corticosteroid.  I would like to test her blood today for serum eosinophils and IgE once again in case she has a bad spell of recurrent flareups. Perhaps one of the new biologic agents for asthma could be used to help control this if she encounters this in the future.  Plan: Continue Qvar 2 puffs twice a day, though we will write a prescription for poor puff twice a day as she often needs this when she's having recurrent symptoms CBC with differential Serum IgE Had hypertonic saline to use when she's having increasing chest congestion Continue albuterol as needed Continue Mucinex on days when she's having increasing chest congestion  Follow-up 6 months  15 minutes met in direct consultation, 26 minutes on visit 

## 2016-10-11 NOTE — Progress Notes (Signed)
Subjective:    Patient ID: Kathryn Griffin, female    DOB: 16-Jan-1956, 61 y.o.   MRN: [id number]  Synopsis: 61 yo female developed cough and thick mucus produciton for the first time in her life in 2013. Produced casts with coughing on several occassions and responded to antibiotic and prednisone therapy.  Bronchoscopy in 04/2013 showed mucus plugging/cast LUL. Bronch cultures grew aspergillus species. Baseline GERD.  Methacholine challenge 01/2013 negative.  IgE low/normal 2014. Eosinophil elevated one time in early 2014.  She was treated with itraconazole for a month and did well by January 2015.  HPI Chief Complaint  Patient presents with  . Follow-up    2 month f/u aspergillus fumigatus, had flare up at the end of march but ended upo not using the prednisone, still on Qvar   Kathryn Griffin has been off work for the last few days.  Kathryn Griffin is better. She filled the prednisone we called in in the end of March but she didn't need to take it. He has been good since then.  The only thing she has done differently is taking the nebulizer in the morning and then mucinex.  She is taking QVar 2 puff bid.  No dyspnea or wheezing right now.  She is walking a lot right now.   Past Medical History:  Diagnosis Date  . Aspergillus fumigatus ([place]) 11/14  . Reflux   . Vaginal atrophy   . Vitamin D deficiency      Review of Systems  Constitutional: Negative for chills, fatigue and fever.  HENT: Negative for postnasal drip, rhinorrhea and sinus pressure.   Respiratory: Negative for cough, shortness of breath and wheezing.   Cardiovascular: Negative for chest pain, palpitations and leg swelling.         Objective:   Physical Exam Vitals:   10/11/16 1550  BP: 112/70  Pulse: 79  SpO2: 98%  Weight: 159 lb 6.4 oz (72.3 kg)  Height: 5' 7.5" (1.715 m)  RA  Gen: well appearing HENT: OP clear, TM's clear, neck supple PULM: CTA B, normal percussion CV: RRR, no mgr, trace edema GI: BS+, soft,  nontender Derm: no cyanosis or rash Psyche: normal mood and affect    08/02/2013 CXR > normal   04/2013 bronch > large left upper lobe mucus plug/cast causing left upper lobe collapse 04/2013 bronch culture> aspergillus species 05/2013 IgE 156 05/2013 Apergillus precipitans > low positive 07/2013 sputum culture > negative 08/2013 sputum culture > aspergillus sp 01/2014 sputum culture > aspergillus sp    Assessment & Plan:   Infection due to aspergillus fumigatus [facility]) She had a flareup earlier this spring but she is now improving. We have learned this round that she can treat with more aggressive mucociliary clearance measures without having to take a steroid. However, she continues to need an inhaled corticosteroid.  I would like to test her blood today for serum eosinophils and IgE once again in case she has a bad spell of recurrent flareups. Perhaps one of the new biologic agents for asthma could be used to help control this if she encounters this in the future.  Plan: Continue Qvar 2 puffs twice a day, though we will write a prescription for poor puff twice a day as she often needs this when she's having recurrent symptoms CBC with differential Serum IgE Had hypertonic saline to use when she's having increasing chest congestion Continue albuterol as needed Continue Mucinex on days when she's having increasing chest congestion  Follow-up 6 months  15 minutes met in direct consultation, 26 minutes on visit   Updated Medication List Outpatient Encounter Prescriptions as of 10/11/2016  Medication Sig  . albuterol (PROVENTIL) (2.5 MG/3ML) 0.083% nebulizer solution Take 3 mLs (2.5 mg total) by nebulization every 6 (six) hours as needed for wheezing or shortness of breath.  . beclomethasone (QVAR) 80 MCG/ACT inhaler Inhale 2 puffs into the lungs 2 (two) times daily.  . beclomethasone (QVAR) 80 MCG/ACT inhaler Inhale 2 puffs into the lungs 2 (two) times daily.  . Calcium Citrate  (CITRACAL PO) Take 1 tablet by mouth daily at 8 pm.   . Cholecalciferol (VITAMIN D) 2000 units CAPS Take 1 capsule by mouth daily.  Marland Kitchen EPINEPHrine (EPIPEN 2-PAK) 0.3 mg/0.3 mL IJ SOAJ injection Inject 0.3 mLs (0.3 mg total) into the muscle once.  Marland Kitchen ESTRACE VAGINAL 0.1 MG/GM vaginal cream PLACE 6.96 APPLICATORFULS VAGINALLY TWICE A WEEK  . famotidine (PEPCID) 20 MG tablet Take 20 mg by mouth as needed.   . fexofenadine (ALLEGRA) 180 MG tablet Take 180 mg by mouth daily.  . fluticasone (FLONASE) 50 MCG/ACT nasal spray Place 2 sprays into both nostrils daily.   Marland Kitchen guaiFENesin (MUCINEX) 600 MG 12 hr tablet Take by mouth 2 (two) times daily.  . meloxicam (MOBIC) 15 MG tablet Take 15 mg by mouth daily.  . Multiple Vitamin (MULTIVITAMIN) capsule Take 1 capsule by mouth daily.    Marland Kitchen omeprazole (PRILOSEC) 40 MG capsule Take 40 mg by mouth daily.  . predniSONE (DELTASONE) 10 MG tablet 66mX2 days, 314mX2 days, 2014m2 days, 41m60mdays, then stop.  . SpMarland Kitchencer/Aero-Holding Chambers (AEROCHAMBER MV) inhaler Use as instructed  . VENTOLIN HFA 108 (90 Base) MCG/ACT inhaler inhale 2 puffs by mouth every 4 hours if needed   No facility-administered encounter medications on file as of 10/11/2016.

## 2016-10-12 ENCOUNTER — Ambulatory Visit: Payer: 59 | Admitting: Pulmonary Disease

## 2016-10-12 LAB — IGE: IgE (Immunoglobulin E), Serum: 227 kU/L — ABNORMAL HIGH (ref <115)

## 2016-10-28 DIAGNOSIS — M47812 Spondylosis without myelopathy or radiculopathy, cervical region: Secondary | ICD-10-CM | POA: Diagnosis not present

## 2016-10-28 DIAGNOSIS — M542 Cervicalgia: Secondary | ICD-10-CM | POA: Diagnosis not present

## 2016-11-09 ENCOUNTER — Encounter: Payer: Self-pay | Admitting: Gynecology

## 2016-11-15 DIAGNOSIS — M545 Low back pain: Secondary | ICD-10-CM | POA: Diagnosis not present

## 2016-11-15 DIAGNOSIS — M542 Cervicalgia: Secondary | ICD-10-CM | POA: Diagnosis not present

## 2016-11-15 DIAGNOSIS — M47812 Spondylosis without myelopathy or radiculopathy, cervical region: Secondary | ICD-10-CM | POA: Diagnosis not present

## 2016-11-28 ENCOUNTER — Telehealth: Payer: Self-pay | Admitting: Pulmonary Disease

## 2016-11-28 NOTE — Telephone Encounter (Signed)
BQ  Please Advise-  Pt states she is noticing when she is using her sodium chloride hypertonic solution in her nebulizer machine, she is using it for 15 mins and its barely running out. She states she is concerned that it will take her an hour to completely finish the one vial and she is suppose to do this twice a day.

## 2016-11-29 NOTE — Telephone Encounter (Signed)
Spoke with the pt and notified of recs   She states the vials are too big  They are 15 ml per vial  Should she take half of it? Please advise thanks!

## 2016-11-29 NOTE — Telephone Encounter (Signed)
This is unusual.  Tell her to review this document about appropriate hypertonic saline use: https://johnston.net/.  I wonder if she is using too much solution.  If she has the volumes right then she should only use it for about 15-20 minutes each time.

## 2016-11-30 NOTE — Telephone Encounter (Signed)
Yep, try 1/2 a vial at a time

## 2016-11-30 NOTE — Telephone Encounter (Signed)
Patient asks that we leave detailed message if possible, CB is [phone].

## 2016-11-30 NOTE — Telephone Encounter (Signed)
Kathryn Griffin, a better solution would be to get her the nebulizer device mentioned in the document I referred to earlier.  Can we get her that? Then she could use the 34mL faster.

## 2016-11-30 NOTE — Telephone Encounter (Signed)
Spoke with patient regarding BQ's recs. Explained to patient that he recommended using 7.69mL of the solution. She stated that the vials are single use and with that method she is practically wasting money. She stated that the vials were the size of her thumb, twice the size of what she normally gets.   She wanted to know if she needs to bring the solution by the office.   BQ, please advise.

## 2016-11-30 NOTE — Telephone Encounter (Signed)
LMTCB

## 2016-12-01 MED ORDER — SODIUM CHLORIDE 3 % IN NEBU: INHALATION_SOLUTION | Freq: Two times a day (BID) | RESPIRATORY_TRACT | 11 refills | Status: DC

## 2016-12-01 NOTE — Telephone Encounter (Signed)
Spoke with pt and explained nebulizer explained in attached document.  Pt states she would be willing to try a different nebulizer but her main concern is that she is being given 38mL of hypertonic saline for a single use dose (for reference a vial of albuterol is 8mL).  Pt is concerned that she is not being given the correct dosage of hypertonic saline from her pharmacy.   Pt received her hypertonic saline from [employer] [facility].  Typically we prescribe from [facility] as most pharmacies do not carry hypertonic saline. [facility] to verify how much hypertonic saline is dispensed from them in a single dose vial- pharmacist verified this is dispensed as a 36mL single dose vial.    Spoke with pt, [facility].  Pt aware.  Nothing further needed.  Forwarding to BQ just as FYI.

## 2016-12-02 DIAGNOSIS — M545 Low back pain: Secondary | ICD-10-CM | POA: Diagnosis not present

## 2016-12-02 DIAGNOSIS — M47816 Spondylosis without myelopathy or radiculopathy, lumbar region: Secondary | ICD-10-CM | POA: Diagnosis not present

## 2016-12-05 NOTE — Telephone Encounter (Signed)
Noted, thanks!

## 2016-12-06 ENCOUNTER — Other Ambulatory Visit: Payer: Self-pay | Admitting: Obstetrics & Gynecology

## 2016-12-06 DIAGNOSIS — Z1231 Encounter for screening mammogram for malignant neoplasm of breast: Secondary | ICD-10-CM

## 2016-12-08 DIAGNOSIS — R58 Hemorrhage, not elsewhere classified: Secondary | ICD-10-CM | POA: Diagnosis not present

## 2016-12-19 ENCOUNTER — Other Ambulatory Visit: Payer: Self-pay

## 2016-12-19 MED ORDER — BECLOMETHASONE DIPROP HFA 80 MCG/ACT IN AERB: 4.0000 | INHALATION_SPRAY | Freq: Two times a day (BID) | RESPIRATORY_TRACT | 11 refills | Status: DC

## 2016-12-21 ENCOUNTER — Ambulatory Visit
Admission: RE | Admit: 2016-12-21 | Discharge: 2016-12-21 | Disposition: A | Discharge status: Home / Self Care | Payer: 59 | Source: Ambulatory Visit | Attending: Obstetrics & Gynecology | Admitting: Obstetrics & Gynecology

## 2016-12-21 DIAGNOSIS — Z1231 Encounter for screening mammogram for malignant neoplasm of breast: Secondary | ICD-10-CM

## 2017-01-02 DIAGNOSIS — M545 Low back pain: Secondary | ICD-10-CM | POA: Diagnosis not present

## 2017-01-02 DIAGNOSIS — M461 Sacroiliitis, not elsewhere classified: Secondary | ICD-10-CM | POA: Diagnosis not present

## 2017-01-02 DIAGNOSIS — M47816 Spondylosis without myelopathy or radiculopathy, lumbar region: Secondary | ICD-10-CM | POA: Diagnosis not present

## 2017-01-13 DIAGNOSIS — M461 Sacroiliitis, not elsewhere classified: Secondary | ICD-10-CM | POA: Diagnosis not present

## 2017-02-23 DIAGNOSIS — M461 Sacroiliitis, not elsewhere classified: Secondary | ICD-10-CM | POA: Diagnosis not present

## 2017-03-21 DIAGNOSIS — J4530 Mild persistent asthma, uncomplicated: Secondary | ICD-10-CM | POA: Diagnosis not present

## 2017-03-21 DIAGNOSIS — J3089 Other allergic rhinitis: Secondary | ICD-10-CM | POA: Diagnosis not present

## 2017-03-21 DIAGNOSIS — Z87892 Personal history of anaphylaxis: Secondary | ICD-10-CM | POA: Diagnosis not present

## 2017-03-29 ENCOUNTER — Encounter: Payer: 59 | Admitting: Obstetrics & Gynecology

## 2017-03-29 ENCOUNTER — Encounter: Payer: 59 | Admitting: Gynecology

## 2017-04-03 DIAGNOSIS — M533 Sacrococcygeal disorders, not elsewhere classified: Secondary | ICD-10-CM | POA: Diagnosis not present

## 2017-04-12 ENCOUNTER — Encounter: Payer: Self-pay | Admitting: Pulmonary Disease

## 2017-04-12 ENCOUNTER — Ambulatory Visit (INDEPENDENT_AMBULATORY_CARE_PROVIDER_SITE_OTHER): Payer: 59 | Admitting: Pulmonary Disease

## 2017-04-12 VITALS — BP 130/70 | HR 65 | Ht 67.5 in | Wt 160.0 lb

## 2017-04-12 DIAGNOSIS — J4550 Severe persistent asthma, uncomplicated: Secondary | ICD-10-CM | POA: Diagnosis not present

## 2017-04-12 DIAGNOSIS — Z23 Encounter for immunization: Secondary | ICD-10-CM

## 2017-04-12 DIAGNOSIS — B4489 Other forms of aspergillosis: Secondary | ICD-10-CM

## 2017-04-12 NOTE — Progress Notes (Signed)
Subjective:    Patient ID: Kathryn Griffin, female    DOB: November 26, 1955, 61 y.o.   MRN: [id number]  Synopsis: 61 yo female developed cough and thick mucus produciton for the first time in her life in 2013. Produced casts with coughing on several occassions and responded to antibiotic and prednisone therapy.  Bronchoscopy in 04/2013 showed mucus plugging/cast LUL. Bronch cultures grew aspergillus species. Baseline GERD.  Methacholine challenge 01/2013 negative.  IgE low/normal 2014. Eosinophil elevated one time in early 2014.  She was treated with itraconazole for a month and did well by January 2015.  HPI Chief Complaint  Patient presents with  . Follow-up    pt has good and bad days, has noticed occasional discolored mucus production, some chest tightness.   She is just OK, not great.  She has been feeling kind of congested and winded from time to time.  She coughed out some thick mucus back on Saturday.  No fever no chills.  She is using albuterol nearly every day at this point.  She has remained off prednisone since the spring.  She used the hypertonic saline back in the summer and managed to mange her symptoms without prednisone.  She will sometimes feel mucus rattling around but it doesn't always come out.    She says that a few months back after school started she acutally had an attack of her asthma when she was visiting her mother in [place].  She was driving by herself when that happened and she thinks that this is the first time she had this.  This occurred after she went to some old buildings that "smelled weird".    She is taking QVar regularly.  No recent sinus problems.      Past Medical History:  Diagnosis Date  . Aspergillus fumigatus ([place]) 11/14  . Reflux   . Vaginal atrophy   . Vitamin D deficiency      Review of Systems  Constitutional: Negative for chills, fatigue and fever.  HENT: Negative for postnasal drip, rhinorrhea and sinus pressure.   Respiratory:  Negative for cough, shortness of breath and wheezing.   Cardiovascular: Negative for chest pain, palpitations and leg swelling.         Objective:   Physical Exam Vitals:   04/12/17 1605  BP: 130/70  Pulse: 65  SpO2: 99%  Weight: 160 lb (72.6 kg)  Height: 5' 7.5" (1.715 m)  RA  Gen: well appearing HENT: OP clear, TM's clear, neck supple PULM: CTA B, normal percussion CV: RRR, no mgr, trace edema GI: BS+, soft, nontender Derm: no cyanosis or rash Psyche: normal mood and affect     08/02/2013 CXR > normal   04/2013 bronch > large left upper lobe mucus plug/cast causing left upper lobe collapse 04/2013 bronch culture> aspergillus species 05/2013 IgE 156 05/2013 Apergillus precipitans > low positive 07/2013 sputum culture > negative 08/2013 sputum culture > aspergillus sp 01/2014 sputum culture > aspergillus sp  Labs: 09/2016 absolute eosinophil 200 cell/dL, IgE 227     Assessment & Plan:   No diagnosis found.  Discussion: Severe persistent asthma with elevated serum IgE. Kathryn Griffin has been dealing with worsening asthma symptoms this year. She currently has poorly controlled asthma as she is using albuterol on a daily basis. She reports no concomitant acid reflux or postnasal drip symptoms at this time to explain this. Given the frequency of exacerbations, recurrent prednisone use she needs throughout the course of the year and the length of her  illness we need to start her on Xolair to try to help stop the frequency of exacerbations.  Plan: We are going to start a medicine called Xolair, this process will take a few weeks to get going Continue taking Qvar as you were doing Continue taking Allegra as you are doing Continue using your allergic rhinitis treatment with Flonase Continue your acid reflux treatment with Pepcid Flu shot today  We'll see you back in about 2-3 months or sooner if needed    Current Outpatient Prescriptions:  .  albuterol (PROVENTIL) (2.5  MG/3ML) 0.083% nebulizer solution, Take 3 mLs (2.5 mg total) by nebulization every 6 (six) hours as needed for wheezing or shortness of breath., Disp: 75 mL, Rfl: 12 .  beclomethasone (QVAR REDIHALER) 80 MCG/ACT inhaler, Inhale 4 puffs into the lungs 2 (two) times daily., Disp: 1 Inhaler, Rfl: 11 .  Calcium Citrate (CITRACAL PO), Take 1 tablet by mouth daily at 8 pm. , Disp: , Rfl:  .  Cholecalciferol (VITAMIN D) 2000 units CAPS, Take 1 capsule by mouth daily., Disp: , Rfl:  .  EPINEPHrine (EPIPEN 2-PAK) 0.3 mg/0.3 mL IJ SOAJ injection, Inject 0.3 mLs (0.3 mg total) into the muscle once., Disp: 1 Device, Rfl: 0 .  ESTRACE VAGINAL 0.1 MG/GM vaginal cream, PLACE 9.79 APPLICATORFULS VAGINALLY TWICE A WEEK, Disp: 42.5 g, Rfl: 5 .  famotidine (PEPCID) 20 MG tablet, Take 20 mg by mouth as needed. , Disp: , Rfl:  .  fexofenadine (ALLEGRA) 180 MG tablet, Take 180 mg by mouth daily., Disp: , Rfl:  .  fluticasone (FLONASE) 50 MCG/ACT nasal spray, Place 2 sprays into both nostrils daily. , Disp: , Rfl:  .  guaiFENesin (MUCINEX) 600 MG 12 hr tablet, Take by mouth 2 (two) times daily., Disp: , Rfl:  .  Multiple Vitamin (MULTIVITAMIN) capsule, Take 1 capsule by mouth daily.  , Disp: , Rfl:  .  omeprazole (PRILOSEC) 40 MG capsule, Take 40 mg by mouth daily., Disp: , Rfl:  .  sodium chloride HYPERTONIC 3 % nebulizer solution, Take by nebulization 2 (two) times daily., Disp: 750 mL, Rfl: 11 .  Spacer/Aero-Holding Chambers (AEROCHAMBER MV) inhaler, Use as instructed, Disp: 1 each, Rfl: 0 .  VENTOLIN HFA 108 (90 Base) MCG/ACT inhaler, inhale 2 puffs by mouth every 4 hours if needed, Disp: 18 g, Rfl: 5

## 2017-04-12 NOTE — Patient Instructions (Signed)
Severe persistent asthma with elevated serum IgE.   Plan: We are going to start a medicine called Xolair, this process will take a few weeks to get going Continue taking Qvar as you were doing Continue taking Allegra as you are doing Continue using your allergic rhinitis treatment with Flonase Continue your acid reflux treatment with Pepcid Flu shot today  We'll see you back in about 2-3 months or sooner if needed

## 2017-04-13 ENCOUNTER — Encounter: Payer: Self-pay | Admitting: Obstetrics & Gynecology

## 2017-04-13 ENCOUNTER — Ambulatory Visit (INDEPENDENT_AMBULATORY_CARE_PROVIDER_SITE_OTHER): Payer: 59 | Admitting: Obstetrics & Gynecology

## 2017-04-13 VITALS — BP 110/72 | Ht 67.0 in | Wt 160.0 lb

## 2017-04-13 DIAGNOSIS — Z1151 Encounter for screening for human papillomavirus (HPV): Secondary | ICD-10-CM | POA: Diagnosis not present

## 2017-04-13 DIAGNOSIS — Z780 Asymptomatic menopausal state: Secondary | ICD-10-CM

## 2017-04-13 DIAGNOSIS — Z01419 Encounter for gynecological examination (general) (routine) without abnormal findings: Secondary | ICD-10-CM

## 2017-04-13 DIAGNOSIS — N952 Postmenopausal atrophic vaginitis: Secondary | ICD-10-CM | POA: Diagnosis not present

## 2017-04-13 DIAGNOSIS — R29898 Other symptoms and signs involving the musculoskeletal system: Secondary | ICD-10-CM | POA: Diagnosis not present

## 2017-04-13 MED ORDER — ESTRADIOL 10 MCG VA TABS: 1.0000 | ORAL_TABLET | VAGINAL | 5 refills | Status: DC

## 2017-04-13 NOTE — Progress Notes (Signed)
Kathryn Griffin Dec 23, 1955 [id number]   History:    61 y.o.  Married.  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  Son 71 yo doing a PHD.  Daughter 51 yo in [place].   RP:  Established patient presenting for annual gyn exam   HPI:  Menopause.  No HRT.  No PMB.  Dyspareunia/dryness.  Would like an alternative to Estradiol cream which is cumbersome.  No pelvic pain.  Breasts wnl.  Mictions/BMs wnl.  SI joint pain doing PT.  Past medical history,surgical history, family history and social history were all reviewed and documented in the EPIC chart.  Gynecologic History No LMP recorded. Patient is postmenopausal. Contraception: post menopausal status Last Pap: 2015. Results were: normal Last mammogram: 2018. Results were: normal Dexa 2016 T-score -2.0 Colono 9 yrs ago  Obstetric History OB History  Gravida Para Term Preterm AB Living  4 2     2 2   SAB TAB Ectopic Multiple Live Births  2            # Outcome Date GA Lbr Len/2nd Weight Sex Delivery Anes PTL Lv  4 SAB           3 SAB           2 Para           1 Para                ROS: A ROS was performed and pertinent positives and negatives are included in the history.  GENERAL: No fevers or chills. HEENT: No change in vision, no earache, sore throat or sinus congestion. NECK: No pain or stiffness. CARDIOVASCULAR: No chest pain or pressure. No palpitations. PULMONARY: No shortness of breath, cough or wheeze. GASTROINTESTINAL: No abdominal pain, nausea, vomiting or diarrhea, melena or bright red blood per rectum. GENITOURINARY: No urinary frequency, urgency, hesitancy or dysuria. MUSCULOSKELETAL: No joint or muscle pain, no back pain, no recent trauma. DERMATOLOGIC: No rash, no itching, no lesions. ENDOCRINE: No polyuria, polydipsia, no heat or cold intolerance. No recent change in weight. HEMATOLOGICAL: No anemia or easy bruising or bleeding. NEUROLOGIC: No headache, seizures, numbness, tingling or weakness. PSYCHIATRIC: No depression, no  loss of interest in normal activity or change in sleep pattern.     Exam:   BP 110/72   Ht 5\' 7"  (1.702 m)   Wt 160 lb (72.6 kg)   BMI 25.06 kg/m   Body mass index is 25.06 kg/m.  General appearance : Well developed well nourished female. No acute distress HEENT: Eyes: no retinal hemorrhage or exudates,  Neck supple, trachea midline, no carotid bruits, no thyroidmegaly Lungs: Clear to auscultation, no rhonchi or wheezes, or rib retractions  Heart: Regular rate and rhythm, no murmurs or gallops Breast:Examined in sitting and supine position were symmetrical in appearance, no palpable masses or tenderness,  no skin retraction, no nipple inversion, no nipple discharge, no skin discoloration, no axillary or supraclavicular lymphadenopathy Abdomen: no palpable masses or tenderness, no rebound or guarding Extremities: no edema or skin discoloration or tenderness  Pelvic: Vulva normal  Bartholin, Urethra, Skene Glands: Within normal limits             Vagina: No gross lesions or discharge  Cervix: No gross lesions or discharge.  Pap/HPV HR done.  Uterus  Intermediate, normal size, shape and consistency, non-tender and mobile  Adnexa  Without masses or tenderness  Anus and perineum  normal     Assessment/Plan:  61 y.o.  female for annual exam   1. Encounter for routine gynecological examination with Papanicolaou smear of cervix Normal gyn exam except Atrophic vaginitis of menopause.  Pap/HPV HR done.  Breasts wnl.  Screening mammo normal 2018.  2. Menopause present No HRT.  No PMB.  Vit D supplements/Ca++ in food/Weight bearing physical activity.  F/U Bone Density. - DG Bone Density; Future  3. Post-menopause atrophic vaginitis Stop Estradiol cream.  Start Vagifem tab vaginally, daily x 2 weeks, then twice a week as needed.  Usage reviewed.   Princess Bruins MD, 4:14 PM 04/13/2017

## 2017-04-15 NOTE — Patient Instructions (Signed)
1. Encounter for routine gynecological examination with Papanicolaou smear of cervix Normal gyn exam except Atrophic vaginitis of menopause.  Pap/HPV HR done.  Breasts wnl.  Screening mammo normal 2018.  2. Menopause present No HRT.  No PMB.  Vit D supplements/Ca++ in food/Weight bearing physical activity.  F/U Bone Density. - DG Bone Density; Future  3. Post-menopause atrophic vaginitis Stop Estradiol cream.  Start Vagifem tab vaginally, daily x 2 weeks, then twice a week as needed.  Usage reviewed.  Kathryn Griffin, it was a pleasure meeting you today!  I will inform you of your results as soon as available.   Health Maintenance for Postmenopausal Women Menopause is a normal process in which your reproductive ability comes to an end. This process happens gradually over a span of months to years, usually between the ages of 69 and 53. Menopause is complete when you have missed 12 consecutive menstrual periods. It is important to talk with your health care provider about some of the most common conditions that affect postmenopausal women, such as heart disease, cancer, and bone loss (osteoporosis). Adopting a healthy lifestyle and getting preventive care can help to promote your health and wellness. Those actions can also lower your chances of developing some of these common conditions. What should I know about menopause? During menopause, you may experience a number of symptoms, such as:  Moderate-to-severe hot flashes.  Night sweats.  Decrease in sex drive.  Mood swings.  Headaches.  Tiredness.  Irritability.  Memory problems.  Insomnia.  Choosing to treat or not to treat menopausal changes is an individual decision that you make with your health care provider. What should I know about hormone replacement therapy and supplements? Hormone therapy products are effective for treating symptoms that are associated with menopause, such as hot flashes and night sweats. Hormone replacement  carries certain risks, especially as you become older. If you are thinking about using estrogen or estrogen with progestin treatments, discuss the benefits and risks with your health care provider. What should I know about heart disease and stroke? Heart disease, heart attack, and stroke become more likely as you age. This may be due, in part, to the hormonal changes that your body experiences during menopause. These can affect how your body processes dietary fats, triglycerides, and cholesterol. Heart attack and stroke are both medical emergencies. There are many things that you can do to help prevent heart disease and stroke:  Have your blood pressure checked at least every 1-2 years. High blood pressure causes heart disease and increases the risk of stroke.  If you are 58-53 years old, ask your health care provider if you should take aspirin to prevent a heart attack or a stroke.  Do not use any tobacco products, including cigarettes, chewing tobacco, or electronic cigarettes. If you need help quitting, ask your health care provider.  It is important to eat a healthy diet and maintain a healthy weight. ? Be sure to include plenty of vegetables, fruits, low-fat dairy products, and lean protein. ? Avoid eating foods that are high in solid fats, added sugars, or salt (sodium).  Get regular exercise. This is one of the most important things that you can do for your health. ? Try to exercise for at least 150 minutes each week. The type of exercise that you do should increase your heart rate and make you sweat. This is known as moderate-intensity exercise. ? Try to do strengthening exercises at least twice each week. Do these in addition to  the moderate-intensity exercise.  Know your numbers.Ask your health care provider to check your cholesterol and your blood glucose. Continue to have your blood tested as directed by your health care provider.  What should I know about cancer screening? There  are several types of cancer. Take the following steps to reduce your risk and to catch any cancer development as early as possible. Breast Cancer  Practice breast self-awareness. ? This means understanding how your breasts normally appear and feel. ? It also means doing regular breast self-exams. Let your health care provider know about any changes, no matter how small.  If you are 33 or older, have a clinician do a breast exam (clinical breast exam or CBE) every year. Depending on your age, family history, and medical history, it may be recommended that you also have a yearly breast X-ray (mammogram).  If you have a family history of breast cancer, talk with your health care provider about genetic screening.  If you are at high risk for breast cancer, talk with your health care provider about having an MRI and a mammogram every year.  Breast cancer (BRCA) gene test is recommended for women who have family members with BRCA-related cancers. Results of the assessment will determine the need for genetic counseling and BRCA1 and for BRCA2 testing. BRCA-related cancers include these types: ? Breast. This occurs in males or females. ? Ovarian. ? Tubal. This may also be called fallopian tube cancer. ? Cancer of the abdominal or pelvic lining (peritoneal cancer). ? Prostate. ? Pancreatic.  Cervical, Uterine, and Ovarian Cancer Your health care provider may recommend that you be screened regularly for cancer of the pelvic organs. These include your ovaries, uterus, and vagina. This screening involves a pelvic exam, which includes checking for microscopic changes to the surface of your cervix (Pap test).  For women ages 21-65, health care providers may recommend a pelvic exam and a Pap test every three years. For women ages 27-65, they may recommend the Pap test and pelvic exam, combined with testing for human papilloma virus (HPV), every five years. Some types of HPV increase your risk of cervical  cancer. Testing for HPV may also be done on women of any age who have unclear Pap test results.  Other health care providers may not recommend any screening for nonpregnant women who are considered low risk for pelvic cancer and have no symptoms. Ask your health care provider if a screening pelvic exam is right for you.  If you have had past treatment for cervical cancer or a condition that could lead to cancer, you need Pap tests and screening for cancer for at least 20 years after your treatment. If Pap tests have been discontinued for you, your risk factors (such as having a new sexual partner) need to be reassessed to determine if you should start having screenings again. Some women have medical problems that increase the chance of getting cervical cancer. In these cases, your health care provider may recommend that you have screening and Pap tests more often.  If you have a family history of uterine cancer or ovarian cancer, talk with your health care provider about genetic screening.  If you have vaginal bleeding after reaching menopause, tell your health care provider.  There are currently no reliable tests available to screen for ovarian cancer.  Lung Cancer Lung cancer screening is recommended for adults 41-58 years old who are at high risk for lung cancer because of a history of smoking. A yearly low-dose  CT scan of the lungs is recommended if you:  Currently smoke.  Have a history of at least 30 pack-years of smoking and you currently smoke or have quit within the past 15 years. A pack-year is smoking an average of one pack of cigarettes per day for one year.  Yearly screening should:  Continue until it has been 15 years since you quit.  Stop if you develop a health problem that would prevent you from having lung cancer treatment.  Colorectal Cancer  This type of cancer can be detected and can often be prevented.  Routine colorectal cancer screening usually begins at age 38  and continues through age 81.  If you have risk factors for colon cancer, your health care provider may recommend that you be screened at an earlier age.  If you have a family history of colorectal cancer, talk with your health care provider about genetic screening.  Your health care provider may also recommend using home test kits to check for hidden blood in your stool.  A small camera at the end of a tube can be used to examine your colon directly (sigmoidoscopy or colonoscopy). This is done to check for the earliest forms of colorectal cancer.  Direct examination of the colon should be repeated every 5-10 years until age 37. However, if early forms of precancerous polyps or small growths are found or if you have a family history or genetic risk for colorectal cancer, you may need to be screened more often.  Skin Cancer  Check your skin from head to toe regularly.  Monitor any moles. Be sure to tell your health care provider: ? About any new moles or changes in moles, especially if there is a change in a mole's shape or color. ? If you have a mole that is larger than the size of a pencil eraser.  If any of your family members has a history of skin cancer, especially at a young age, talk with your health care provider about genetic screening.  Always use sunscreen. Apply sunscreen liberally and repeatedly throughout the day.  Whenever you are outside, protect yourself by wearing long sleeves, pants, a wide-brimmed hat, and sunglasses.  What should I know about osteoporosis? Osteoporosis is a condition in which bone destruction happens more quickly than new bone creation. After menopause, you may be at an increased risk for osteoporosis. To help prevent osteoporosis or the bone fractures that can happen because of osteoporosis, the following is recommended:  If you are 58-64 years old, get at least 1,000 mg of calcium and at least 600 mg of vitamin D per day.  If you are older than  age 18 but younger than age 32, get at least 1,200 mg of calcium and at least 600 mg of vitamin D per day.  If you are older than age 31, get at least 1,200 mg of calcium and at least 800 mg of vitamin D per day.  Smoking and excessive alcohol intake increase the risk of osteoporosis. Eat foods that are rich in calcium and vitamin D, and do weight-bearing exercises several times each week as directed by your health care provider. What should I know about how menopause affects my mental health? Depression may occur at any age, but it is more common as you become older. Common symptoms of depression include:  Low or sad mood.  Changes in sleep patterns.  Changes in appetite or eating patterns.  Feeling an overall lack of motivation or enjoyment of  activities that you previously enjoyed.  Frequent crying spells.  Talk with your health care provider if you think that you are experiencing depression. What should I know about immunizations? It is important that you get and maintain your immunizations. These include:  Tetanus, diphtheria, and pertussis (Tdap) booster vaccine.  Influenza every year before the flu season begins.  Pneumonia vaccine.  Shingles vaccine.  Your health care provider may also recommend other immunizations. This information is not intended to replace advice given to you by your health care provider. Make sure you discuss any questions you have with your health care provider. Document Released: 08/05/2005 Document Revised: 01/01/2016 Document Reviewed: 03/17/2015 Elsevier Interactive Patient Education  2018 [employer].

## 2017-04-17 ENCOUNTER — Telehealth: Payer: Self-pay | Admitting: Pulmonary Disease

## 2017-04-17 NOTE — Telephone Encounter (Signed)
Send to [place] to f/u after form filled out.

## 2017-04-19 DIAGNOSIS — M25551 Pain in right hip: Secondary | ICD-10-CM | POA: Diagnosis not present

## 2017-04-19 LAB — PAP, TP IMAGING W/ HPV RNA, RFLX HPV TYPE 16,18/45: HPV DNA High Risk: NOT DETECTED

## 2017-04-20 NOTE — Telephone Encounter (Signed)
No one said anything to me about Kathryn Griffin starting [employer]. The paperwork didn't make it over my way yet.

## 2017-04-20 NOTE — Telephone Encounter (Signed)
Has the form been filled out? Will route to Caryl Pina and Alroy Bailiff to see where we are on this process Thank you ladies!

## 2017-04-21 DIAGNOSIS — M25551 Pain in right hip: Secondary | ICD-10-CM | POA: Diagnosis not present

## 2017-04-21 NOTE — Telephone Encounter (Signed)
I found Mrs. Harvill's paperwork, I'm taking care of it now.

## 2017-04-21 NOTE — Telephone Encounter (Signed)
[name], can you help with this?

## 2017-04-25 DIAGNOSIS — M25551 Pain in right hip: Secondary | ICD-10-CM | POA: Diagnosis not present

## 2017-04-25 NOTE — Telephone Encounter (Addendum)
Duplicate, closing encounter. I closed the other encounter to un clutter my box and epic closed both.

## 2017-04-26 NOTE — Telephone Encounter (Signed)
I called Kathryn Griffin letting her know she does need to come in and  sign section 5 of the PAN form, so [employer] can run her benefits. Then I happened to think, I could fax it to her (If she has access to a fax machine.) and she won't have to come in. She'll fax it back to [place] and we'll fax it to [employer].

## 2017-04-28 DIAGNOSIS — M25551 Pain in right hip: Secondary | ICD-10-CM | POA: Diagnosis not present

## 2017-05-01 ENCOUNTER — Telehealth: Payer: Self-pay | Admitting: Pulmonary Disease

## 2017-05-01 DIAGNOSIS — M25551 Pain in right hip: Secondary | ICD-10-CM | POA: Diagnosis not present

## 2017-05-01 NOTE — Telephone Encounter (Signed)
I receive a fax from [facility] saying the pt. Completed section 5 of PAN form. I called the pt. She said she brought it in sealed, For Dr. Lake Bells. I don't have it, [place] has it she's supposed to be bringing it to me. (I don't know how long she had.)  I faxed section 5 of PAN form to [employer] this afternoon. Will wait for sob.

## 2017-05-02 ENCOUNTER — Other Ambulatory Visit: Payer: Self-pay | Admitting: Obstetrics & Gynecology

## 2017-05-02 DIAGNOSIS — Z13820 Encounter for screening for osteoporosis: Secondary | ICD-10-CM

## 2017-05-03 DIAGNOSIS — M25551 Pain in right hip: Secondary | ICD-10-CM | POA: Diagnosis not present

## 2017-05-03 NOTE — Telephone Encounter (Signed)
[employer] sent me a fax today saying the PAN form I sent 05/01/17 is old and that we have any more to discard them. The CMA Caryl Pina) that gave her the section 5 PAN form printed it online. Will call [employer] or Gus Rankin tomorrow. (AS it is almost 5:30.) Pt. Came in this afternoon and signed form, I faxed it, and received confirmation fax was sent ok.

## 2017-05-08 ENCOUNTER — Other Ambulatory Visit: Payer: Self-pay | Admitting: Gynecology

## 2017-05-08 DIAGNOSIS — M25551 Pain in right hip: Secondary | ICD-10-CM | POA: Diagnosis not present

## 2017-05-08 DIAGNOSIS — Z13820 Encounter for screening for osteoporosis: Secondary | ICD-10-CM

## 2017-05-11 DIAGNOSIS — Z23 Encounter for immunization: Secondary | ICD-10-CM | POA: Diagnosis not present

## 2017-05-11 DIAGNOSIS — Z13220 Encounter for screening for lipoid disorders: Secondary | ICD-10-CM | POA: Diagnosis not present

## 2017-05-11 DIAGNOSIS — M25551 Pain in right hip: Secondary | ICD-10-CM | POA: Diagnosis not present

## 2017-05-11 DIAGNOSIS — R7301 Impaired fasting glucose: Secondary | ICD-10-CM | POA: Diagnosis not present

## 2017-05-15 DIAGNOSIS — M25551 Pain in right hip: Secondary | ICD-10-CM | POA: Diagnosis not present

## 2017-05-15 NOTE — Telephone Encounter (Signed)
Tammy, please see Shelby's message as to when pt can be scheduled for injection. Thanks.

## 2017-05-15 NOTE — Telephone Encounter (Deleted)
Tammy, please advise. Thanks 

## 2017-05-15 NOTE — Telephone Encounter (Signed)
Patient calling about Xolair and to see when can schedule, [phone].

## 2017-05-16 NOTE — Telephone Encounter (Signed)
I called [employer] to initiate a p/a, the rep. She realized this after we were ready to ask and answer the clinical questions. The rep. Said her Arvid Right has been approved. Ref.# [id number] Dates: 05/10/17 - 05/10/18, it's pending. I called Mrs. Seib back to let her know the status, lmom. I haven't her heard from her since.

## 2017-05-23 ENCOUNTER — Telehealth: Payer: Self-pay | Admitting: Pulmonary Disease

## 2017-05-24 ENCOUNTER — Other Ambulatory Visit: Payer: Self-pay

## 2017-05-24 DIAGNOSIS — R05 Cough: Secondary | ICD-10-CM

## 2017-05-24 DIAGNOSIS — J9819 Other pulmonary collapse: Secondary | ICD-10-CM

## 2017-05-24 DIAGNOSIS — R059 Cough, unspecified: Secondary | ICD-10-CM

## 2017-05-24 MED ORDER — ALBUTEROL SULFATE (2.5 MG/3ML) 0.083% IN NEBU: 2.5000 mg | INHALATION_SOLUTION | Freq: Four times a day (QID) | RESPIRATORY_TRACT | 11 refills | Status: DC | PRN

## 2017-05-24 NOTE — Telephone Encounter (Addendum)
Faxed info. First thing this morning. Nothing further needed.

## 2017-05-25 NOTE — Telephone Encounter (Signed)
I faxed more clinical info. 05/24/17.

## 2017-05-29 DIAGNOSIS — M25551 Pain in right hip: Secondary | ICD-10-CM | POA: Diagnosis not present

## 2017-05-31 ENCOUNTER — Telehealth: Payer: Self-pay | Admitting: Pulmonary Disease

## 2017-05-31 NOTE — Telephone Encounter (Signed)
Pt is aware of below message and voiced her understanding. Nothing further is needed. 

## 2017-05-31 NOTE — Telephone Encounter (Signed)
Called and spoke to pt. Pt reports of increased sob, dizziness & occ chest congestion x1wk. Pt has been monitoring spo2- sats are maintaining between 96-97% Pt taken mucinex daily with no improvement. Pt using albuterol neb bid with mild relief. Pt states she had pred taper at home- starting with 40mg  and deceasing by 10mg  every 3 days.  Pt would like to know if she can take taper.  BQ please advise. Thanks.

## 2017-05-31 NOTE — Telephone Encounter (Signed)
Take the taper Also, I gave Kathryn Griffin a form for her insurance company.  We need to appeal her Xolair

## 2017-06-08 ENCOUNTER — Encounter: Payer: Self-pay | Admitting: Acute Care

## 2017-06-08 ENCOUNTER — Other Ambulatory Visit (INDEPENDENT_AMBULATORY_CARE_PROVIDER_SITE_OTHER): Payer: 59

## 2017-06-08 ENCOUNTER — Ambulatory Visit (INDEPENDENT_AMBULATORY_CARE_PROVIDER_SITE_OTHER)
Admission: RE | Admit: 2017-06-08 | Discharge: 2017-06-08 | Disposition: A | Discharge status: Home / Self Care | Payer: 59 | Source: Ambulatory Visit | Attending: Acute Care | Admitting: Acute Care

## 2017-06-08 ENCOUNTER — Ambulatory Visit: Payer: 59 | Admitting: Acute Care

## 2017-06-08 VITALS — BP 122/68 | HR 80 | Ht 67.5 in | Wt 159.8 lb

## 2017-06-08 DIAGNOSIS — R0602 Shortness of breath: Secondary | ICD-10-CM

## 2017-06-08 DIAGNOSIS — R0789 Other chest pain: Secondary | ICD-10-CM | POA: Diagnosis not present

## 2017-06-08 LAB — CBC WITH DIFFERENTIAL/PLATELET
Basophils Absolute: 0.1 10*3/uL (ref 0.0–0.1)
Basophils Relative: 0.7 % (ref 0.0–3.0)
Eosinophils Absolute: 0.3 10*3/uL (ref 0.0–0.7)
Eosinophils Relative: 2.8 % (ref 0.0–5.0)
HCT: 45.9 % (ref 36.0–46.0)
Hemoglobin: 15.2 g/dL — ABNORMAL HIGH (ref 12.0–15.0)
Lymphocytes Relative: 13.5 % (ref 12.0–46.0)
Lymphs Abs: 1.4 10*3/uL (ref 0.7–4.0)
MCHC: 33.2 g/dL (ref 30.0–36.0)
MCV: 93.5 fl (ref 78.0–100.0)
Monocytes Absolute: 0.6 10*3/uL (ref 0.1–1.0)
Monocytes Relative: 6.2 % (ref 3.0–12.0)
Neutro Abs: 7.7 10*3/uL (ref 1.4–7.7)
Neutrophils Relative %: 76.8 % (ref 43.0–77.0)
Platelets: 370 10*3/uL (ref 150.0–400.0)
RBC: 4.91 Mil/uL (ref 3.87–5.11)
RDW: 13.1 % (ref 11.5–15.5)
WBC: 10.1 10*3/uL (ref 4.0–10.5)

## 2017-06-08 LAB — POCT EXHALED NITRIC OXIDE: FeNO level (ppb): 39

## 2017-06-08 MED ORDER — DOXYCYCLINE HYCLATE 100 MG PO TABS: 100.0000 mg | ORAL_TABLET | Freq: Two times a day (BID) | ORAL | 0 refills | Status: DC

## 2017-06-08 MED ORDER — NYSTATIN 100000 UNIT/ML MT SUSP: 5.0000 mL | Freq: Three times a day (TID) | OROMUCOSAL | 0 refills | Status: DC

## 2017-06-08 MED ORDER — PREDNISONE 10 MG PO TABS: ORAL_TABLET | ORAL | 0 refills | Status: DC

## 2017-06-08 NOTE — Assessment & Plan Note (Signed)
Acute Bronchitis/? Asthma flare Plan: Xopenex neb now FENO today CXR today We will call you with results Sputum for culture , Fungal and AFB CBC with diff D dimer Doxycycline 100 mg  Twice daily x 7 days. Probiotic while on antibiotic ( OTC) Continue taking Qvar as you were doing Continue taking Allegra as you are doing Continue using your allergic rhinitis treatment with Flonase Continue your acid reflux treatment with Pepcid Continue mucinex 1200 mg twice daily with a full glass of water. Complete prednisone taper Dr.McQuaid gave you. Prednisone taper; 10 mg tablets: 4 tabs x 2 days, 3 tabs x 2 days, 2 tabs x 2 days 1 tab x 2 days then stop. Nystatin mouth wash 5 cc's three times daily x 10 days, rinse and spit Follow up with Judson Roch, NP or Dr. Lake Bells in 1 week Please contact office for sooner follow up if symptoms do not improve or worsen or seek emergency care

## 2017-06-08 NOTE — Progress Notes (Signed)
History of Present Illness Kathryn Griffin is a 61 y.o. female never smoker  with  Severe persistent asthma. She is followed by Dr. Lake Bells.  Synopsis: 61 yo female developed cough and thick mucus produciton for the first time in her life in 2013. Produced casts with coughing on several occassions and responded to antibiotic and prednisone therapy.  Bronchoscopy in 04/2013 showed mucus plugging/cast LUL. Bronch cultures grew aspergillus species. Baseline GERD.  Methacholine challenge 01/2013 negative.  IgE low/normal 2014. Eosinophil elevated one time in early 2014.  She was treated with itraconazole for a month and did well by January 2015.   06/08/2017 Acute OV: Pt. Presents for an acute OV. She was last seen in the office 04/12/2017 by Dr. Lake Bells and due to her severe persistent asthma with elevated serum IgE, worsening asthma symptoms,  poorly controlled asthma requiring albuterol use on a daily basis and frequent exacerbations with recurrent prednisone use he felt it was time to start her on Xolair. Plan of care after that visit was as listed below..  Plan: We are going to start a medicine called Xolair, this process will take a few weeks to get going Continue taking Qvar as you were doing Continue taking Allegra as you are doing Continue using your allergic rhinitis treatment with Flonase Continue your acid reflux treatment with Pepcid Flu shot today Follow up in 2-3 months or before  Pt presents today for an acute OV. She states she is having shortness of breath x 2 weeks, which she states comes and goes. Dr. Lake Bells gave her telephone authorization on 05/31/2017  to use a steroid taper, 40 mg x 3 days and decreasing by 10 mg every 3 days. Today is the last day of her taper. After the taper was started she coughed up some grey looking secretions.She stated that she felt better after coughing that up for a short time.She states that she has been watching her oxygen saturations and they  have been ok. She states she got very tired this morning getting ready. She states she is coughing up brown secretions. She states she has no fever, she denies any back pain, but states she is having some intermittent chest tightness.She feels this is from coughing. She states she got no relief from the prednisone taper. She has been using Mucinex every day, albuterol nebs twice daily, Hypertonic nebs twice daily.She tried using  using her flutter valve but it made the shortness of breath worse.She states she has had no leg pain or swelling. She states what she is coughing up is different from the aspergilli's she normally coughs up.She is highly anxious in the office today. She is tearful She has had Xolair denied by her insurance company, and it is in the process of being appealed.  Test Results: CXR 06/08/2017>> : The cardiac silhouette, mediastinal and hilar contours are within normal limits and stable. Stable elevation of both pulmonary hila, likely related to prior granulomas disease. No acute overlying pulmonary process. No pleural effusions. The bony thorax is intact.  IMPRESSION: No acute cardiopulmonary findings.  06/08/2017>> FENO 39 ppb after pred taper 06/08/2017>> Sputum for Cx/ Fungal/ AFB>> 06/08/2017>> D-dimer>> 08/02/2013 CXR > normal 04/2013 bronch > large left upper lobe mucus plug/cast causing left upper lobe collapse 04/2013 bronch culture> aspergillus species 05/2013 IgE 156 05/2013 Apergillus precipitans > low positive 07/2013 sputum culture > negative 08/2013 sputum culture > aspergillus sp 01/2014 sputum culture > aspergillus sp  Labs: 09/2016 absolute eosinophil 200 cell/dL, IgE  227  CBC Latest Ref Rng & Units 10/11/2016 12/04/2014 05/25/2013  WBC 4.0 - 10.5 K/uL 7.2 6.6 13.6(H)  Hemoglobin 12.0 - 15.0 g/dL 13.9 14.2 13.8  Hematocrit 36.0 - 46.0 % 42.0 43.0 40.9  Platelets 150.0 - 400.0 K/uL 360.0 304 289    BMP Latest Ref Rng & Units 05/18/2015 12/04/2014  02/25/2014  Glucose 65 - 99 mg/dL - 88 98  BUN 6 - 20 mg/dL - 17 11  Creatinine 0.44 - 1.00 mg/dL - 0.62 0.8  Sodium 135 - 145 mmol/L - 138 140  Potassium 3.5 - 5.1 mmol/L - 3.0(L) 4.7  Chloride 101 - 111 mmol/L - 100(L) 103  CO2 22 - 32 mmol/L - 27 31  Calcium 8.4 - 10.5 mg/dL 8.7 9.7 9.5     Past medical hx Past Medical History:  Diagnosis Date  . Aspergillus fumigatus ([place]) 11/14  . Reflux   . Vaginal atrophy   . Vitamin D deficiency      Social History   Tobacco Use  . Smoking status: Never Smoker  . Smokeless tobacco: Never Used  Substance Use Topics  . Alcohol use: Yes    Alcohol/week: 0.0 oz    Comment: social  . Drug use: No    Ms.Scotti reports that  has never smoked. she has never used smokeless tobacco. She reports that she drinks alcohol. She reports that she does not use drugs.  Tobacco Cessation: Never smoker  Past surgical hx, Family hx, Social hx all reviewed.  Current Outpatient Medications on File Prior to Visit  Medication Sig  . albuterol (PROVENTIL) (2.5 MG/3ML) 0.083% nebulizer solution Take 3 mLs (2.5 mg total) by nebulization every 6 (six) hours as needed for wheezing or shortness of breath.  . beclomethasone (QVAR REDIHALER) 80 MCG/ACT inhaler Inhale 4 puffs into the lungs 2 (two) times daily.  . Calcium Citrate (CITRACAL PO) Take 1 tablet by mouth daily at 8 pm.   . Cholecalciferol (VITAMIN D) 2000 units CAPS Take 1 capsule by mouth daily.  Marland Kitchen EPINEPHrine (EPIPEN 2-PAK) 0.3 mg/0.3 mL IJ SOAJ injection Inject 0.3 mLs (0.3 mg total) into the muscle once.  . Estradiol 10 MCG TABS vaginal tablet Place 1 tablet (10 mcg total) vaginally 2 (two) times a week. Every day x 2 weeks, then twice a week.  . famotidine (PEPCID) 20 MG tablet Take 20 mg by mouth as needed.   . fexofenadine (ALLEGRA) 180 MG tablet Take 180 mg by mouth daily.  . fluticasone (FLONASE) 50 MCG/ACT nasal spray Place 2 sprays into both nostrils daily.   Marland Kitchen guaiFENesin (MUCINEX) 600  MG 12 hr tablet Take by mouth 2 (two) times daily.  . Multiple Vitamin (MULTIVITAMIN) capsule Take 1 capsule by mouth daily.    Marland Kitchen omeprazole (PRILOSEC) 40 MG capsule Take 40 mg by mouth daily.  . sodium chloride HYPERTONIC 3 % nebulizer solution Take by nebulization 2 (two) times daily.  Marland Kitchen Spacer/Aero-Holding Chambers (AEROCHAMBER MV) inhaler Use as instructed  . VENTOLIN HFA 108 (90 Base) MCG/ACT inhaler inhale 2 puffs by mouth every 4 hours if needed   No current facility-administered medications on file prior to visit.      Allergies  Allergen Reactions  . Molds & Smuts Shortness Of Breath  . Dexilant [Dexlansoprazole] Anxiety  . Protonix [Pantoprazole Sodium] Anxiety    Review Of Systems:  Constitutional:   No  weight loss, night sweats,  Fevers, chills, + fatigue, or  lassitude.  HEENT:   No headaches,  Difficulty swallowing,  Tooth/dental problems, or  Sore throat,                No sneezing, itching, ear ache, nasal congestion, +post nasal drip,   CV:  No chest pain,  Orthopnea, PND, swelling in lower extremities, anasarca, dizziness, palpitations, syncope.   GI  No heartburn, indigestion, abdominal pain, nausea, vomiting, diarrhea, change in bowel habits, loss of appetite, bloody stools.   Resp: + shortness of breath with exertion less at rest.  + excess mucus, + productive cough,  + non-productive cough,  No coughing up of blood.  + change in color of mucus.  Intermittent  wheezing.  No chest wall deformity, chest tightness at intervals  Skin: no rash or lesions.  GU: no dysuria, change in color of urine, no urgency or frequency.  No flank pain, no hematuria   MS:  No joint pain or swelling.  No decreased range of motion.  No back pain.  Psych:  No change in mood or affect. No depression or anxiety.  No memory loss.   Vital Signs BP 122/68 (BP Location: Left Arm, Cuff Size: Normal)   Pulse 80   Ht 5' 7.5" (1.715 m)   Wt 159 lb 12.8 oz (72.5 kg)   SpO2 100%   BMI  24.66 kg/m    Physical Exam:  General- No distress,  A&Ox3, anxious and tearful. ENT: No sinus tenderness, TM clear, pale nasal mucosa, no oral exudate,no post nasal drip, no LAN Cardiac: S1, S2, regular rate and rhythm, no murmur Chest: No wheeze/ rales/ dullness; no accessory muscle use, no nasal flaring, no sternal retractions, + rhonchi, diminished per bases Abd.: Soft Non-tender, non-distended, BS + Ext: No clubbing cyanosis, edema Neuro:  normal strength, cranial nerves intact, MAE x 4, appropriate Skin: No rashes, warm and dry Psych: Very anxious   Assessment/Plan  Acute bronchitis Acute Bronchitis/? Asthma flare Plan: Xopenex neb now FENO today CXR today We will call you with results Sputum for culture , Fungal and AFB CBC with diff D dimer Doxycycline 100 mg  Twice daily x 7 days. Probiotic while on antibiotic ( OTC) Continue taking Qvar as you were doing Continue taking Allegra as you are doing Continue using your allergic rhinitis treatment with Flonase Continue your acid reflux treatment with Pepcid Continue mucinex 1200 mg twice daily with a full glass of water. Complete prednisone taper Dr.McQuaid gave you. Prednisone taper; 10 mg tablets: 4 tabs x 2 days, 3 tabs x 2 days, 2 tabs x 2 days 1 tab x 2 days then stop. Nystatin mouth wash 5 cc's three times daily x 10 days, rinse and spit Follow up with Judson Roch, NP or Dr. Lake Bells in 1 week Please contact office for sooner follow up if symptoms do not improve or worsen or seek emergency care     Appeal  is in process for [place], NP 06/08/2017  11:20 AM

## 2017-06-08 NOTE — Patient Instructions (Addendum)
It is good to see you today Xopenex neb now FENO today CXR today We will call you with results Sputum for culture , Fungal and AFB CBC with diff D dimer Doxycycline 100 mg  Twice daily x 7 days. Continue taking Qvar as you were doing Continue taking Allegra as you are doing Continue using your allergic rhinitis treatment with Flonase Continue your acid reflux treatment with Pepcid Continue mucinex 1200 mg twice daily with a full glass of water. Complete prednisone taper Dr.McQuaid gave you. Prednisone taper; 10 mg tablets: 4 tabs x 2 days, 3 tabs x 2 days, 2 tabs x 2 days 1 tab x 2 days then stop. Nystatin mouth wash 5 cc's three times daily x 10 days, rinse and spit Follow up with Judson Roch, NP or Dr. Lake Bells in 1 week Please contact office for sooner follow up if symptoms do not improve or worsen or seek emergency care

## 2017-06-09 ENCOUNTER — Telehealth: Payer: Self-pay | Admitting: Pulmonary Disease

## 2017-06-09 LAB — D-DIMER, QUANTITATIVE: D-Dimer, Quant: 0.26 mcg/mL FEU (ref <0.50)

## 2017-06-09 NOTE — Telephone Encounter (Signed)
Notes recorded by Magdalen Spatz, NP on 06/08/2017 at 2:49 PM EST Let her know she appears to e dehydrated and have her drink plenty of fluids. Thanks so much ------  Notes recorded by Magdalen Spatz, NP on 06/08/2017 at 2:48 PM EST Please call patient and let her know her CXR did not indicate pneumonia. Let her know her WBC was normal. Have her follow the plan of care developed in the office today, and follow up in 1 week or sooner if needed. Thanks so much  Spoke with patient. She is aware of the above results. Nothing else needed at time of call.

## 2017-06-14 ENCOUNTER — Ambulatory Visit: Payer: 59 | Admitting: Acute Care

## 2017-06-14 ENCOUNTER — Telehealth: Payer: Self-pay | Admitting: Pulmonary Disease

## 2017-06-15 ENCOUNTER — Ambulatory Visit: Payer: 59 | Admitting: Pulmonary Disease

## 2017-06-15 ENCOUNTER — Other Ambulatory Visit: Payer: 59

## 2017-06-15 ENCOUNTER — Encounter: Payer: Self-pay | Admitting: Pulmonary Disease

## 2017-06-15 VITALS — BP 122/74 | HR 95 | Ht 67.5 in | Wt 159.0 lb

## 2017-06-15 DIAGNOSIS — J45909 Unspecified asthma, uncomplicated: Secondary | ICD-10-CM

## 2017-06-15 DIAGNOSIS — R05 Cough: Secondary | ICD-10-CM | POA: Diagnosis not present

## 2017-06-15 DIAGNOSIS — R0602 Shortness of breath: Secondary | ICD-10-CM

## 2017-06-15 DIAGNOSIS — J479 Bronchiectasis, uncomplicated: Secondary | ICD-10-CM | POA: Diagnosis not present

## 2017-06-15 DIAGNOSIS — B4489 Other forms of aspergillosis: Secondary | ICD-10-CM | POA: Diagnosis not present

## 2017-06-15 DIAGNOSIS — R059 Cough, unspecified: Secondary | ICD-10-CM

## 2017-06-15 MED ORDER — AMOXICILLIN-POT CLAVULANATE 875-125 MG PO TABS
1.0000 | ORAL_TABLET | Freq: Two times a day (BID) | ORAL | 0 refills | Status: DC
Start: 2017-06-15 — End: 2017-07-07

## 2017-06-15 MED ORDER — FLUTICASONE FUROATE-VILANTEROL 200-25 MCG/INH IN AEPB: 1.0000 | INHALATION_SPRAY | Freq: Every day | RESPIRATORY_TRACT | 6 refills | Status: DC

## 2017-06-15 MED ORDER — FLUTTER DEVI: 0 refills | Status: DC

## 2017-06-15 MED ORDER — MONTELUKAST SODIUM 10 MG PO TABS: 10.0000 mg | ORAL_TABLET | Freq: Every day | ORAL | 2 refills | Status: DC

## 2017-06-15 MED ORDER — FLUTICASONE FUROATE-VILANTEROL 200-25 MCG/INH IN AEPB: 1.0000 | INHALATION_SPRAY | Freq: Every day | RESPIRATORY_TRACT | 0 refills | Status: AC

## 2017-06-15 MED ORDER — PREDNISONE 10 MG PO TABS: ORAL_TABLET | ORAL | 0 refills | Status: DC

## 2017-06-15 NOTE — Patient Instructions (Addendum)
Severe persistent asthma with recurrent exacerbations complicated by Aspergillus, allergic bronchopulmonary aspergillosis: Take prednisone 20 mg daily times 1 week, then 10 mg daily times 2 weeks Take Brio 1 puff daily no matter how you feel Keep taking Qvar 4 puffs twice a day Keep using albuterol nebulizer twice a day Keep using hypertonic saline twice a day Take Singulair daily Take Mucinex twice a day I will call your insurance company to get approval for Xolair We will order a high-resolution CT scan of your chest to ensure there is no evidence of another problem going on Take Augmentin twice a day times 5 days Please bring [place] a sample of your mucus if you are able to produce  We will see you back in 2-3 weeks

## 2017-06-15 NOTE — Addendum Note (Signed)
Addended by: Maryanna Shape A on: 06/15/2017 10:25 AM   Modules accepted: Orders

## 2017-06-15 NOTE — Addendum Note (Signed)
Addended by: Maryanna Shape A on: 06/15/2017 10:24 AM   Modules accepted: Orders

## 2017-06-15 NOTE — Progress Notes (Signed)
Subjective:    Patient ID: Kathryn Griffin, female    DOB: 05-31-1956, 61 y.o.   MRN: [id number]  Synopsis: 61 yo female developed cough and thick mucus produciton for the first time in her life in 2013. Produced casts with coughing on several occassions and responded to antibiotic and prednisone therapy.  Bronchoscopy in 04/2013 showed mucus plugging/cast LUL. Bronch cultures grew aspergillus species. Baseline GERD.  Methacholine challenge 01/2013 negative.  IgE low/normal 2014. Eosinophil elevated one time in early 2014.  She was treated with itraconazole for a month and did well by January 2015.  HPI Chief Complaint  Patient presents with  . Follow-up    1wk rov- pt reports of sob with exertion, occ chest congestion & chest tightness   Kathryn Griffin still hasn't received coverage for her Xolair.  She was sick a week ago. And she feels like it is worse than prior.  She is more short of breath than normal.  A week ago she was more short of breath than normal and cna't walk very far without getting short of breath.  She says taht she feel tightness in her chest.  She had some pain in her hcest a few weeks ago, this is gone now.  She has felt chest congestion off and on in her chest.  This too has improved but she remains short of breath.  When she started prednisone a few weeks ago she coughed up a black piece of mucus which fits with her prior experiences with aspergilosis.  She feels like there is still mucus in her chest she can't cough out.    She is doing her albuterol nebulizer bid and hypertonic saline neb bid.  She is doing extra strength mucinex bid.  She is taking QVar 4 BID. She is about to finish prednisone.      Past Medical History:  Diagnosis Date  . Aspergillus fumigatus ([place]) 11/14  . Reflux   . Vaginal atrophy   . Vitamin D deficiency      Review of Systems  Constitutional: Negative for chills, fatigue and fever.  HENT: Negative for postnasal drip, rhinorrhea and sinus  pressure.   Respiratory: Positive for cough, shortness of breath and wheezing.   Cardiovascular: Negative for chest pain, palpitations and leg swelling.         Objective:   Physical Exam Vitals:   06/15/17 0943  BP: 122/74  Pulse: 95  SpO2: 98%  Weight: 159 lb (72.1 kg)  Height: 5' 7.5" (1.715 m)  RA  Gen: well appearing HENT: OP clear, TM's clear, neck supple PULM: Wheezing lower lobes bilaterally B, normal percussion CV: RRR, no mgr, trace edema GI: BS+, soft, nontender Derm: no cyanosis or rash Psyche: normal mood and affect     08/02/2013 CXR > normal   04/2013 bronch > large left upper lobe mucus plug/cast causing left upper lobe collapse 04/2013 bronch culture> aspergillus species 05/2013 IgE 156 05/2013 Apergillus precipitans > low positive 07/2013 sputum culture > negative 08/2013 sputum culture > aspergillus sp 01/2014 sputum culture > aspergillus sp  Labs: 09/2016 absolute eosinophil 200 cell/dL, IgE 227  Records from earlier visit in December for a flare of asthma reviewed, treated with prednisone     Assessment & Plan:   Cough - Plan: Fungus Culture with Smear, Respiratory or Resp and Sputum Culture,  MYCOBACTERIA, CULTURE, WITH FLUOROCHROME SMEAR  Shortness of breath  Asthma, unspecified asthma severity, unspecified whether complicated, unspecified whether persistent - Plan: CT Chest High  Resolution  Discussion: Kathryn Griffin is not doing well.  She has an ABPA-like syndrome best characterized as severe persistent asthma with elevated serum IgE.  She has been compliant with mucociliary clearance measures, high-dose inhaled corticosteroids, but yet she continues to struggle with recurrent exacerbations.  We will add a long-acting beta agonist and large particle inhaled corticosteroid.  We will add Singulair.  I believe strongly she needs Xolair to prevent these recurrent exacerbations.  The ddx includes an alternative bacterial infection so we will ask for  another sputum culture and treat again with augmentin, though I think the best approach is going to be more prednisone for now.    Severe persistent asthma with recurrent exacerbations complicated by Aspergillus, allergic bronchopulmonary aspergillosis: Take prednisone 20 mg daily times 1 week, then 10 mg daily times 2 weeks Take Brio 1 puff daily no matter how you feel Keep taking Qvar 4 puffs twice a day Keep using albuterol nebulizer twice a day Keep using hypertonic saline twice a day Take Singulair daily Take Mucinex twice a day I will call your insurance company to get approval for Xolair We will order a high-resolution CT scan of your chest to ensure there is no evidence of another problem going on Take Augmentin twice a day times 5 days  We will see you back in 2-3 weeks    Current Outpatient Medications:  .  albuterol (PROVENTIL) (2.5 MG/3ML) 0.083% nebulizer solution, Take 3 mLs (2.5 mg total) by nebulization every 6 (six) hours as needed for wheezing or shortness of breath., Disp: 360 mL, Rfl: 11 .  beclomethasone (QVAR REDIHALER) 80 MCG/ACT inhaler, Inhale 4 puffs into the lungs 2 (two) times daily., Disp: 1 Inhaler, Rfl: 11 .  Calcium Citrate (CITRACAL PO), Take 1 tablet by mouth daily at 8 pm. , Disp: , Rfl:  .  Cholecalciferol (VITAMIN D) 2000 units CAPS, Take 1 capsule by mouth daily., Disp: , Rfl:  .  EPINEPHrine (EPIPEN 2-PAK) 0.3 mg/0.3 mL IJ SOAJ injection, Inject 0.3 mLs (0.3 mg total) into the muscle once., Disp: 1 Device, Rfl: 0 .  Estradiol 10 MCG TABS vaginal tablet, Place 1 tablet (10 mcg total) vaginally 2 (two) times a week. Every day x 2 weeks, then twice a week., Disp: 24 tablet, Rfl: 5 .  famotidine (PEPCID) 20 MG tablet, Take 20 mg by mouth as needed. , Disp: , Rfl:  .  fexofenadine (ALLEGRA) 180 MG tablet, Take 180 mg by mouth daily., Disp: , Rfl:  .  fluticasone (FLONASE) 50 MCG/ACT nasal spray, Place 2 sprays into both nostrils daily. , Disp: , Rfl:  .   guaiFENesin (MUCINEX) 600 MG 12 hr tablet, Take by mouth 2 (two) times daily., Disp: , Rfl:  .  Multiple Vitamin (MULTIVITAMIN) capsule, Take 1 capsule by mouth daily.  , Disp: , Rfl:  .  omeprazole (PRILOSEC) 40 MG capsule, Take 40 mg by mouth daily., Disp: , Rfl:  .  predniSONE (DELTASONE) 10 MG tablet, 4tabs x's 2 days, 3 tabs x's 2days, 2 tabs x's 2 days, 1 tab x's 2 days, Disp: 20 tablet, Rfl: 0 .  sodium chloride HYPERTONIC 3 % nebulizer solution, Take by nebulization 2 (two) times daily., Disp: 750 mL, Rfl: 11 .  Spacer/Aero-Holding Chambers (AEROCHAMBER MV) inhaler, Use as instructed, Disp: 1 each, Rfl: 0 .  VENTOLIN HFA 108 (90 Base) MCG/ACT inhaler, inhale 2 puffs by mouth every 4 hours if needed, Disp: 18 g, Rfl: 5 .  amoxicillin-clavulanate (AUGMENTIN) 875-125 MG  tablet, Take 1 tablet by mouth 2 (two) times daily., Disp: 10 tablet, Rfl: 0 .  montelukast (SINGULAIR) 10 MG tablet, Take 1 tablet (10 mg total) by mouth daily., Disp: 30 tablet, Rfl: 2 .  Respiratory Therapy Supplies (FLUTTER) DEVI, Use as directed, Disp: 1 each, Rfl: 0

## 2017-06-17 ENCOUNTER — Encounter: Payer: Self-pay | Admitting: Emergency Medicine

## 2017-06-17 ENCOUNTER — Telehealth: Payer: Self-pay | Admitting: Emergency Medicine

## 2017-06-17 NOTE — Telephone Encounter (Signed)
Pt called to discuss her medications. She was recently started on breo, has felt some tachycardia and "strong heartbeat" since she started it.  We talked about the side effect profile, the pros and cons of continuing to see if she gets used to the medication.  She has been taking it at night before bedtime.  I recommended that she start taking it during the day, see if she can begin to tolerate.  If she has a heart rate in the 120s-130s I stop it.  She will call back next week to let us know if she thinks is going to be able to continue or if we need to find an alternative.

## 2017-06-19 ENCOUNTER — Telehealth: Payer: Self-pay | Admitting: Pulmonary Disease

## 2017-06-19 NOTE — Telephone Encounter (Signed)
Can stop BREO to see if this helps  But most likely will need to be seen by PCP or urgent care for workup for Tachycardia . If she is taking Albuterol , advise her to hold this as well.  Will need follow up with Dr. Lake Bells to sort this out .  Please contact office for sooner follow up if symptoms do not improve or worsen or seek emergency care

## 2017-06-19 NOTE — Telephone Encounter (Signed)
Spoke with the pt  She had called on 06/17/17 and spoke with RB on call:   Collene Gobble, MD      06/17/17 10:40 AM  Note   Pt called to discuss her medications. She was recently started on breo, has felt some tachycardia and "strong heartbeat" since she started it.  We talked about the side effect profile, the pros and cons of continuing to see if she gets used to the medication.  She has been taking it at night before bedtime.  I recommended that she start taking it during the day, see if she can begin to tolerate.  If she has a heart rate in the 120s-130s I stop it.  She will call back next week to let us know if she thinks is going to be able to continue or if we need to find an alternative.     She states she tried taking the Breo in the am, and this did not change her increased heart rate  Still ranges in the 120's for approx 25 min  Please advise recs, thanks!

## 2017-06-19 NOTE — Telephone Encounter (Signed)
Spoke with the pt and notified of recs per TP She verbalized understanding She will stop the [facility] and albuterol  She will keep f/u here on 07/07/17 and seek emergent care sooner if needed

## 2017-06-21 ENCOUNTER — Telehealth: Payer: Self-pay | Admitting: Pulmonary Disease

## 2017-06-23 NOTE — Telephone Encounter (Signed)
Error

## 2017-06-23 NOTE — Telephone Encounter (Signed)
Prior Auth. Was initiated 05/15/17, they approved it, ref.# [id number] Dates:05/10/17 05/10/18. The p/a had to process, they allow 7 to 10 business days. (05/29/17) I received a fax from [employer] 05/29/17 that the p/a was pending.  I received a fax from [place] saying it was denied. I immediately called because we had gotten an approval. (I'm sorry between pts, ordering meds, and new starts, I forgot to document.)  I sent additional info. (more clinical notes), once to [facility] 05/24/17 and to their p/a dept. 05/26/17, because I hadn't heard from them.I assumed they used the additional info for the appeal, I called back several times, p/a pending. On 06/14/17 they told me to do a peer to peer. (has to be called in by the Dr.)  Lesleigh Noe came and asked me what was going on with her xolair, I told her [facility] had advised we do a peer to peer. You had put in your 06/15/17 ov notes I will call her ins. To get her xolair approved. There is no other documentation. Please advise.

## 2017-06-25 NOTE — Telephone Encounter (Signed)
Hi, please provide me with the information to do the peer to peer.  Thanks.

## 2017-06-26 ENCOUNTER — Ambulatory Visit (INDEPENDENT_AMBULATORY_CARE_PROVIDER_SITE_OTHER)
Admission: RE | Admit: 2017-06-26 | Discharge: 2017-06-26 | Disposition: A | Discharge status: Home / Self Care | Payer: 59 | Source: Ambulatory Visit | Attending: Pulmonary Disease | Admitting: Pulmonary Disease

## 2017-06-26 DIAGNOSIS — J45909 Unspecified asthma, uncomplicated: Secondary | ICD-10-CM | POA: Diagnosis not present

## 2017-06-26 DIAGNOSIS — R0602 Shortness of breath: Secondary | ICD-10-CM | POA: Diagnosis not present

## 2017-06-26 NOTE — Telephone Encounter (Signed)
I put everything on top of your laptop which is on your desk.

## 2017-06-28 ENCOUNTER — Telehealth: Payer: Self-pay | Admitting: Pulmonary Disease

## 2017-06-28 NOTE — Telephone Encounter (Signed)
Spoke with the pt and notified will route msg to BQ to check on ct chest results  Please advise, thanks!

## 2017-06-28 NOTE — Telephone Encounter (Signed)
Sent message to [place] today

## 2017-06-28 NOTE — Telephone Encounter (Signed)
See phone note; results have already been discussed with pt.  Will close encounter.

## 2017-07-03 ENCOUNTER — Telehealth: Payer: Self-pay | Admitting: Pulmonary Disease

## 2017-07-03 ENCOUNTER — Ambulatory Visit (INDEPENDENT_AMBULATORY_CARE_PROVIDER_SITE_OTHER): Payer: 59

## 2017-07-03 ENCOUNTER — Other Ambulatory Visit: Payer: Self-pay | Admitting: Gynecology

## 2017-07-03 DIAGNOSIS — M8589 Other specified disorders of bone density and structure, multiple sites: Secondary | ICD-10-CM

## 2017-07-03 DIAGNOSIS — Z13820 Encounter for screening for osteoporosis: Secondary | ICD-10-CM | POA: Diagnosis not present

## 2017-07-03 DIAGNOSIS — J209 Acute bronchitis, unspecified: Secondary | ICD-10-CM | POA: Diagnosis not present

## 2017-07-03 MED ORDER — DOXYCYCLINE HYCLATE 100 MG PO TABS: 100.0000 mg | ORAL_TABLET | Freq: Two times a day (BID) | ORAL | 0 refills | Status: DC

## 2017-07-03 NOTE — Telephone Encounter (Signed)
Spoke with patient. She is aware of BQ's recs. Doxy has been called into [facility] for her. Nothing else needed at time of call.

## 2017-07-03 NOTE — Telephone Encounter (Signed)
Switch to doxycycline 100mg  po bid x 5 days

## 2017-07-03 NOTE — Telephone Encounter (Signed)
Called and spoke with pt who stated she was dx with bronchitis at urgent care and was put on levofloxacin.   Pt stated she was worried about being placed on this abx due to being on pred and also because of a warning that it could cause tears of ligaments.  Dr. Lake Bells, please advise if pt is okay to still be on this abx or do you want to switch pt to another abx to help with the bronchitis.  Thanks!

## 2017-07-04 ENCOUNTER — Telehealth: Payer: Self-pay | Admitting: Gynecology

## 2017-07-04 ENCOUNTER — Encounter: Payer: Self-pay | Admitting: Gynecology

## 2017-07-04 DIAGNOSIS — M8580 Other specified disorders of bone density and structure, unspecified site: Secondary | ICD-10-CM

## 2017-07-04 HISTORY — DX: Other specified disorders of bone density and structure, unspecified site: M85.80

## 2017-07-04 NOTE — Telephone Encounter (Signed)
Tell patient her most recent bone density continues to show osteopenia.  There does seem to be some loss from her spine and hips.  I would recommend having a vitamin D level, TSH, PTH, comprehensive metabolic panel and 82-XHBZ urine collection for calcium excretion as she is a little young to have age related loss of calcium from the bones to make sure there is nothing else going on.  Total dietary calcium daily should be at 1500 mg.  Weightbearing exercise on a regular basis such as walking is also good for the bones.  I would recommend repeating the bone density in 2 years.

## 2017-07-05 DIAGNOSIS — R000 Tachycardia, unspecified: Secondary | ICD-10-CM | POA: Diagnosis not present

## 2017-07-06 NOTE — Telephone Encounter (Signed)
Patient informed with the below note, will call to schedule lab appointment.

## 2017-07-06 NOTE — Telephone Encounter (Signed)
Left message for pt to call.

## 2017-07-06 NOTE — Telephone Encounter (Addendum)
Dr. Lake Bells came back 07-05-17. He called [employer] and was going to do a peer to peer, they said to do the xolair rx again. ( It's the first of the year)  I called and took care of that.  I gave a verbal rx. We have to wait for it to process. (5 to 7 bus. Days)

## 2017-07-07 ENCOUNTER — Encounter: Payer: Self-pay | Admitting: Adult Health

## 2017-07-07 ENCOUNTER — Ambulatory Visit (INDEPENDENT_AMBULATORY_CARE_PROVIDER_SITE_OTHER): Payer: 59 | Admitting: Adult Health

## 2017-07-07 DIAGNOSIS — J309 Allergic rhinitis, unspecified: Secondary | ICD-10-CM

## 2017-07-07 DIAGNOSIS — J209 Acute bronchitis, unspecified: Secondary | ICD-10-CM | POA: Diagnosis not present

## 2017-07-07 DIAGNOSIS — J4551 Severe persistent asthma with (acute) exacerbation: Secondary | ICD-10-CM

## 2017-07-07 DIAGNOSIS — J45909 Unspecified asthma, uncomplicated: Secondary | ICD-10-CM | POA: Insufficient documentation

## 2017-07-07 DIAGNOSIS — J45901 Unspecified asthma with (acute) exacerbation: Secondary | ICD-10-CM

## 2017-07-07 MED ORDER — FLUTICASONE FUROATE-VILANTEROL 200-25 MCG/INH IN AEPB: 1.0000 | INHALATION_SPRAY | Freq: Every day | RESPIRATORY_TRACT | 6 refills | Status: DC

## 2017-07-07 NOTE — Assessment & Plan Note (Signed)
Cont on current regimen  

## 2017-07-07 NOTE — Assessment & Plan Note (Signed)
Improved with recent abx

## 2017-07-07 NOTE — Assessment & Plan Note (Signed)
Recurrent asthmatic exacerbations.  Patient is now resolving from recent flare.  She is awaiting Xolair approval.  Have asked her to retry [place].  Emphasized to use once daily only.  If palpitations or tachycardia return or persist she is to contact us immediately.   She will continue on her aggressive pulmonary and mucociliary regimen  Plan  . Patient Instructions  Restart BREO  1 puff daily  Keep taking Qvar 4 puffs twice a day Keep using albuterol nebulizer twice a day Keep using hypertonic saline twice a day Take Singulair daily Take Mucinex twice a day Xolair process underway .  Follow up with Dr. Lake Bells 6-8 weeks and As needed  Please contact office for sooner follow up if symptoms do not improve or worsen or seek emergency care

## 2017-07-07 NOTE — Progress Notes (Signed)
@Patient  ID: Kathryn Griffin, female    DOB: [birth year], 62 y.o.   MRN: [id number]  Chief Complaint  Patient presents with  . Follow-up    Referring provider: Darcus Austin, MD  HPI: 62 year old female never smoker followed for Aspergillus and ABPA Like Syndrome (characterized by severe persistent asthma with an elevated IgE)  TEST [facility]  04/2013 bronch > large left upper lobe mucus plug/cast causing left upper lobe collapse 04/2013 bronch culture> aspergillus species 05/2013 IgE 156 05/2013 Apergillus precipitans > low positive 07/2013 sputum culture > negative 08/2013 sputum culture > aspergillus sp 01/2014 sputum culture > aspergillus sp  07/07/2017 Follow up : Asthma  Patient returns for a 3-week follow-up.  Patient had a severe asthmatic exacerbation last visit.  She was treated with prednisone for 2 weeks.  5 days of Augmentin.  Started on BRE0 she was continued on her Qvar,  albuterol and hypertonic nebs.  She has been recommended for Xolair and is undergoing the approval process. She says she is feeling better with decreased cough and congestion.  She says that she was only able to take Brio for a few days.  Due to palpitations  And tachycardia unfortunately she misunderstood the instructions and took Brio 2 puffs twice daily we talked about potential side effects of long-acting bronchodilators with tachycardia.  She said it lasted for about 30 minutes and resolved.  She had no associated chest pain syncope or diaphoresis.. Patient denies any hemoptysis orthopnea PND nausea vomiting or diarrhea.  Allergies  Allergen Reactions  . Molds & Smuts Shortness Of Breath  . Dexilant [Dexlansoprazole] Anxiety  . Protonix [Pantoprazole Sodium] Anxiety    Immunization History  Administered Date(s) Administered  . Influenza Split 04/19/2013, 05/10/2015, 04/23/2016  . Influenza Whole 03/27/2012, 04/29/2014  . Influenza,inj,Quad PF,6+ Mos 04/12/2017  . Pneumococcal Conjugate-13  01/21/2015  . Pneumococcal Polysaccharide-23 10/15/2012    Past Medical History:  Diagnosis Date  . Aspergillus fumigatus ([place]) 11/14  . Osteopenia 07/04/2017   T score -2.1 FRAX 9.6% / 1.3%  . Reflux   . Vaginal atrophy   . Vitamin D deficiency     Tobacco History: Social History   Tobacco Use  Smoking Status Never Smoker  Smokeless Tobacco Never Used   Counseling given: Not Answered   Outpatient Encounter Medications as of 07/07/2017  Medication Sig  . albuterol (PROVENTIL) (2.5 MG/3ML) 0.083% nebulizer solution Take 3 mLs (2.5 mg total) by nebulization every 6 (six) hours as needed for wheezing or shortness of breath.  . beclomethasone (QVAR REDIHALER) 80 MCG/ACT inhaler Inhale 4 puffs into the lungs 2 (two) times daily.  . Calcium Citrate (CITRACAL PO) Take 1 tablet by mouth daily at 8 pm.   . Cholecalciferol (VITAMIN D) 2000 units CAPS Take 1 capsule by mouth daily.  Marland Kitchen doxycycline (VIBRA-TABS) 100 MG tablet Take 1 tablet (100 mg total) by mouth 2 (two) times daily.  Marland Kitchen EPINEPHrine (EPIPEN 2-PAK) 0.3 mg/0.3 mL IJ SOAJ injection Inject 0.3 mLs (0.3 mg total) into the muscle once.  . Estradiol 10 MCG TABS vaginal tablet Place 1 tablet (10 mcg total) vaginally 2 (two) times a week. Every day x 2 weeks, then twice a week.  . famotidine (PEPCID) 20 MG tablet Take 20 mg by mouth as needed.   . fexofenadine (ALLEGRA) 180 MG tablet Take 180 mg by mouth daily.  . fluticasone (FLONASE) 50 MCG/ACT nasal spray Place 2 sprays into both nostrils daily.   Marland Kitchen guaiFENesin (MUCINEX) 600 MG 12  hr tablet Take by mouth 2 (two) times daily.  . montelukast (SINGULAIR) 10 MG tablet Take 1 tablet (10 mg total) by mouth daily.  . Multiple Vitamin (MULTIVITAMIN) capsule Take 1 capsule by mouth daily.    Marland Kitchen omeprazole (PRILOSEC) 40 MG capsule Take 40 mg by mouth daily.  . predniSONE (DELTASONE) 10 MG tablet 2 tabs x 1 week then 1 tab daily for 2 weeks then stop  . Respiratory Therapy Supplies (FLUTTER)  DEVI Use as directed  . sodium chloride HYPERTONIC 3 % nebulizer solution Take by nebulization 2 (two) times daily.  Marland Kitchen Spacer/Aero-Holding Chambers (AEROCHAMBER MV) inhaler Use as instructed  . VENTOLIN HFA 108 (90 Base) MCG/ACT inhaler inhale 2 puffs by mouth every 4 hours if needed  . fluticasone furoate-vilanterol (BREO ELLIPTA) 200-25 MCG/INH AEPB Inhale 1 puff into the lungs daily.  . [DISCONTINUED] amoxicillin-clavulanate (AUGMENTIN) 875-125 MG tablet Take 1 tablet by mouth 2 (two) times daily. (Patient not taking: Reported on 07/07/2017)  . [DISCONTINUED] fluticasone furoate-vilanterol (BREO ELLIPTA) 200-25 MCG/INH AEPB Inhale 1 puff into the lungs daily. (Patient not taking: Reported on 07/07/2017)  . [DISCONTINUED] predniSONE (DELTASONE) 10 MG tablet 4tabs x's 2 days, 3 tabs x's 2days, 2 tabs x's 2 days, 1 tab x's 2 days (Patient not taking: Reported on 07/07/2017)   No facility-administered encounter medications on file as of 07/07/2017.      Review of Systems  Constitutional:   No  weight loss, night sweats,  Fevers, chills, fatigue, or  lassitude.  HEENT:   No headaches,  Difficulty swallowing,  Tooth/dental problems, or  Sore throat,                No sneezing, itching, ear ache, + nasal congestion, post nasal drip,   CV:  No chest pain,  Orthopnea, PND, swelling in lower extremities, anasarca, dizziness, palpitations, syncope.   GI  No heartburn, indigestion, abdominal pain, nausea, vomiting, diarrhea, change in bowel habits, loss of appetite, bloody stools.   Resp: No chest wall deformity  Skin: no rash or lesions.  GU: no dysuria, change in color of urine, no urgency or frequency.  No flank pain, no hematuria   MS:  No joint pain or swelling.  No decreased range of motion.  No back pain.    Physical Exam  BP 114/62 (BP Location: Right Arm, Cuff Size: Normal)   Pulse 89   Ht 5' 7.5" (1.715 m)   Wt 161 lb 9.6 oz (73.3 kg)   SpO2 98%   BMI 24.94 kg/m   GEN:  A/Ox3; pleasant , NAD, well nourished    HEENT:  [place]/AT,  EACs-clear, TMs-wnl, NOSE-clear, THROAT-clear, no lesions, no postnasal drip or exudate noted.   NECK:  Supple w/ fair ROM; no JVD; normal carotid impulses w/o bruits; no thyromegaly or nodules palpated; no lymphadenopathy.    RESP few trace rhonchi  no accessory muscle use, no dullness to percussion  CARD:  RRR, no m/r/g, no peripheral edema, pulses intact, no cyanosis or clubbing.  GI:   Soft & nt; nml bowel sounds; no organomegaly or masses detected.   Musco: Warm bil, no deformities or joint swelling noted.   Neuro: alert, no focal deficits noted.    Skin: Warm, no lesions or rashes    Lab Results:  CBC  BNP No results found for: BNP  ProBNP No results found for: PROBNP  Imaging: Dg Chest 2 View  Result Date: 06/08/2017 CLINICAL DATA:  Dyspnea on exertion with congestion and  chest tightness for 2 weeks. EXAM: CHEST  2 VIEW COMPARISON:  03/22/2016 FINDINGS: The cardiac silhouette, mediastinal and hilar contours are within normal limits and stable. Stable elevation of both pulmonary hila, likely related to prior granulomas disease. No acute overlying pulmonary process. No pleural effusions. The bony thorax is intact. IMPRESSION: No acute cardiopulmonary findings. Electronically Signed   By: Marijo Sanes M.D.   On: 06/08/2017 11:49   Ct Chest High Resolution  Result Date: 06/26/2017 CLINICAL DATA:  62 year old female with history of persistent shortness of breath and cough. History of Aspergillus infection. Increasing shortness of breath with exertion for 1 week. EXAM: CT CHEST WITHOUT CONTRAST TECHNIQUE: Multidetector CT imaging of the chest was performed following the standard protocol without intravenous contrast. High resolution imaging of the lungs, as well as inspiratory and expiratory imaging, was performed. COMPARISON:  Chest CT 02/25/2014. FINDINGS: Cardiovascular: Heart size is normal. There is no significant  pericardial fluid, thickening or pericardial calcification. There is aortic atherosclerosis, as well as atherosclerosis of the great vessels of the mediastinum and the coronary arteries, including calcified atherosclerotic plaque in the left anterior descending and left circumflex coronary arteries. Mediastinum/Nodes: No pathologically enlarged mediastinal or hilar lymph nodes. Please note that accurate exclusion of hilar adenopathy is limited on noncontrast CT scans. Esophagus is unremarkable in appearance. No axillary lymphadenopathy. Lungs/Pleura: Small area of architectural distortion and volume loss in the lateral segment of the right middle lobe, most compatible with chronic post infectious or inflammatory scarring. High-resolution images otherwise demonstrate no significant regions of ground-glass attenuation, septal thickening, subpleural reticulation or honeycombing. A few areas of very mild cylindrical bronchiectasis are noted, most evident in the right middle lobe. Inspiratory and expiratory imaging demonstrates some mild air trapping indicative of small airways disease. No acute consolidative airspace disease. No pleural effusions. Mild bilateral apical nodular pleuroparenchymal thickening, most compatible with chronic post infectious or inflammatory scarring. Upper Abdomen: Aortic atherosclerosis. Musculoskeletal: There are no aggressive appearing lytic or blastic lesions noted in the visualized portions of the skeleton. IMPRESSION: 1. No definite evidence of interstitial lung disease. 2. Few areas of very mild cylindrical bronchiectasis are noted, most evident in the right middle lobe where there is also some chronic post infectious or inflammatory scarring. 3. Mild air trapping indicative of mild small airways disease. 4. Aortic atherosclerosis, in addition to 2 vessel coronary artery disease. Please note that although the presence of coronary artery calcium documents the presence of coronary artery  disease, the severity of this disease and any potential stenosis cannot be assessed on this non-gated CT examination. Assessment for potential risk factor modification, dietary therapy or pharmacologic therapy may be warranted, if clinically indicated. Aortic Atherosclerosis (ICD10-I70.0). Electronically Signed   By: Vinnie Langton M.D.   On: 06/26/2017 09:33     Assessment & Plan:   Asthma exacerbation Recurrent asthmatic exacerbations.  Patient is now resolving from recent flare.  She is awaiting Xolair approval.  Have asked her to retry [place].  Emphasized to use once daily only.  If palpitations or tachycardia return or persist she is to contact us immediately.   She will continue on her aggressive pulmonary and mucociliary regimen  Plan  . Patient Instructions  Restart BREO  1 puff daily  Keep taking Qvar 4 puffs twice a day Keep using albuterol nebulizer twice a day Keep using hypertonic saline twice a day Take Singulair daily Take Mucinex twice a day Xolair process underway .  Follow up with Dr. Lake Bells 6-8 weeks  and As needed  Please contact office for sooner follow up if symptoms do not improve or worsen or seek emergency care         Allergic rhinitis Cont on current regimen   Acute bronchitis Improved with recent abx      Rexene Edison, NP 07/07/2017

## 2017-07-07 NOTE — Patient Instructions (Signed)
Restart BREO  1 puff daily  Keep taking Qvar 4 puffs twice a day Keep using albuterol nebulizer twice a day Keep using hypertonic saline twice a day Take Singulair daily Take Mucinex twice a day Xolair process underway .  Follow up with Dr. Lake Bells 6-8 weeks and As needed  Please contact office for sooner follow up if symptoms do not improve or worsen or seek emergency care

## 2017-07-10 NOTE — Progress Notes (Signed)
Reviewed, agree 

## 2017-07-21 ENCOUNTER — Ambulatory Visit: Payer: 59 | Admitting: Pulmonary Disease

## 2017-07-22 DIAGNOSIS — J101 Influenza due to other identified influenza virus with other respiratory manifestations: Secondary | ICD-10-CM | POA: Diagnosis not present

## 2017-07-30 DIAGNOSIS — M25561 Pain in right knee: Secondary | ICD-10-CM | POA: Diagnosis not present

## 2017-08-10 ENCOUNTER — Ambulatory Visit (INDEPENDENT_AMBULATORY_CARE_PROVIDER_SITE_OTHER): Payer: 59 | Admitting: Internal Medicine

## 2017-08-10 ENCOUNTER — Other Ambulatory Visit: Payer: 59

## 2017-08-10 ENCOUNTER — Encounter: Payer: Self-pay | Admitting: Internal Medicine

## 2017-08-10 VITALS — BP 100/82 | HR 66 | Wt 161.0 lb

## 2017-08-10 DIAGNOSIS — R0789 Other chest pain: Secondary | ICD-10-CM | POA: Diagnosis not present

## 2017-08-10 DIAGNOSIS — M8580 Other specified disorders of bone density and structure, unspecified site: Secondary | ICD-10-CM | POA: Diagnosis not present

## 2017-08-10 DIAGNOSIS — R0602 Shortness of breath: Secondary | ICD-10-CM | POA: Diagnosis not present

## 2017-08-10 NOTE — Patient Instructions (Addendum)
Medication Instructions:  Your physician recommends that you continue on your current medications as directed. Please refer to the Current Medication list given to you today.  -- If you need a refill on your cardiac medications before your next appointment, please call your pharmacy. --  Labwork: None ordered  Testing/Procedures:  Your physician has requested that you have an echocardiogram.   Echocardiography is a painless test that uses sound waves to create images of your heart. It provides your doctor with information about the size and shape of your heart and how well your heart's chambers and valves are working. This procedure takes approximately one hour. There are no restrictions for this procedure.   Follow-Up: Your physician wants you to follow-up in: 2 MONTHS with Dr. Saunders Revel.     Thank you for choosing [facility]!!    Any Other Special Instructions Will Be Listed Below (If Applicable).

## 2017-08-10 NOTE — Progress Notes (Addendum)
New Outpatient Visit Date: 08/10/2017  Referring Provider: Darcus Austin, MD [address], [place] [address]  Chief Complaint: Shortness of breath  HPI:  Ms. Argyle is a 62 y.o. female who is being seen today for the evaluation of shortness of breath and tachycardia at the request of Darcus Austin, MD. She has a history of allergic bronchopulmonary aspergillosis and gestational diabetes. Over the last several months, Ms. Rader has noted considerable shortness of breath.  However it worsened significantly in December.  She presented to her pulmonologist office and became extremely short of breath walking from the parking lot to the front desk of the office.  She was prescribed 2 separate courses of prednisone and different antibiotics with only modest improvement in her symptoms.  Ms. Shimamoto also noted rapid heartbeats when she first started using Breo, though she now believes that she was taking too much of it.  The tachycardia has resolved.  Ms. Novitski also notes substernal chest discomfort in December that began shortly before her decline in breathing.  The pain is very difficult to describe but lasted continuously for several days before it spontaneously resolved.  Its maximal intensity was 3/10 without exacerbating or alleviating factors.  There were no associated symptoms.  Ms. Sprunger denies orthopnea, PND, and edema.  Ms. Koppel does not have a history of cardiac disease (other than palpitations during menopause) or prior heart testing.  She also denies a history of DVT/PE.  She has not had any recent immobilization.  She notes that she is on prednisone frequently due to her chronic lung conditions.  She drinks 2 cups of caffeinated coffee per day.  --------------------------------------------------------------------------------------------------  Cardiovascular History & Procedures: Cardiovascular Problems:  Shortness of breath  Risk  Factors:  None  Cath/PCI:  None  CV Surgery:  None  EP Procedures and Devices:  None  Non-Invasive Evaluation(s):  None  Recent CV Pertinent Labs: Lab Results  Component Value Date   CHOL 196 02/19/2015   HDL 87 02/19/2015   LDLCALC 93 02/19/2015   TRIG 80 02/19/2015   CHOLHDL 2.3 02/19/2015   INR 0.94 12/04/2014   K 4.6 08/10/2017   MG 2.4 05/25/2013   BUN 17 08/10/2017   CREATININE 0.72 08/10/2017    --------------------------------------------------------------------------------------------------  Past Medical History:  Diagnosis Date  . Aspergillus fumigatus ([place]) 11/14  . Hx of gestational diabetes mellitus, not currently pregnant   . Osteopenia 07/04/2017   T score -2.1 FRAX 9.6% / 1.3%  . Prediabetes   . Reflux   . Vaginal atrophy   . Vitamin D deficiency     Past Surgical History:  Procedure Laterality Date  . BREAST CYST EXCISION  1990  . COLPOSCOPY  2009   benign  . TUBAL LIGATION  1995    Current Meds  Medication Sig  . albuterol (PROVENTIL) (2.5 MG/3ML) 0.083% nebulizer solution Take 3 mLs (2.5 mg total) by nebulization every 6 (six) hours as needed for wheezing or shortness of breath.  . beclomethasone (QVAR REDIHALER) 80 MCG/ACT inhaler Inhale 4 puffs into the lungs 2 (two) times daily.  . Calcium Citrate (CITRACAL PO) Take 1 tablet by mouth daily at 8 pm.   . Cholecalciferol (VITAMIN D) 2000 units CAPS Take 1 capsule by mouth daily.  Marland Kitchen EPINEPHrine (EPIPEN 2-PAK) 0.3 mg/0.3 mL IJ SOAJ injection Inject 0.3 mLs (0.3 mg total) into the muscle once.  . Estradiol 10 MCG TABS vaginal tablet Place 1 tablet (10 mcg total) vaginally 2 (two)  times a week. Every day x 2 weeks, then twice a week.  . famotidine (PEPCID) 20 MG tablet Take 20 mg by mouth as needed.   . fexofenadine (ALLEGRA) 180 MG tablet Take 180 mg by mouth daily.  . fluticasone (FLONASE) 50 MCG/ACT nasal spray Place 2 sprays into both nostrils daily.   . fluticasone  furoate-vilanterol (BREO ELLIPTA) 200-25 MCG/INH AEPB Inhale 1 puff into the lungs daily.  Marland Kitchen guaiFENesin (MUCINEX) 600 MG 12 hr tablet Take by mouth 2 (two) times daily.  . meloxicam (MOBIC) 15 MG tablet Take 1 tablet by mouth daily.  . montelukast (SINGULAIR) 10 MG tablet Take 1 tablet (10 mg total) by mouth daily.  . Multiple Vitamin (MULTIVITAMIN) capsule Take 1 capsule by mouth daily.    Marland Kitchen omeprazole (PRILOSEC) 40 MG capsule Take 40 mg by mouth daily.  Marland Kitchen Respiratory Therapy Supplies (FLUTTER) DEVI Use as directed  . sodium chloride HYPERTONIC 3 % nebulizer solution Take by nebulization 2 (two) times daily.  Marland Kitchen Spacer/Aero-Holding Chambers (AEROCHAMBER MV) inhaler Use as instructed  . VENTOLIN HFA 108 (90 Base) MCG/ACT inhaler inhale 2 puffs by mouth every 4 hours if needed  . [DISCONTINUED] predniSONE (DELTASONE) 10 MG tablet 2 tabs x 1 week then 1 tab daily for 2 weeks then stop    Allergies: Molds & smuts; Dexilant [dexlansoprazole]; and Protonix [pantoprazole sodium]  Social History   Socioeconomic History  . Marital status: Married    Spouse name: Not on file  . Number of children: 2  . Years of education: Not on file  . Highest education level: Not on file  Social Needs  . Financial resource strain: Not on file  . Food insecurity - worry: Not on file  . Food insecurity - inability: Not on file  . Transportation needs - medical: Not on file  . Transportation needs - non-medical: Not on file  Occupational History  . Occupation: Personnel officer: [employer]  Tobacco Use  . Smoking status: Never Smoker  . Smokeless tobacco: Never Used  Substance and Sexual Activity  . Alcohol use: Yes    Alcohol/week: 2.4 oz    Types: 4 Glasses of wine per week  . Drug use: No  . Sexual activity: Yes    Partners: Male    Birth control/protection: Surgical    Comment: 1st intercourse 62 yo-Fewer than 5 partners-BTL  Other Topics Concern  . Not on file  Social  History Narrative  . Not on file    Family History  Problem Relation Age of Onset  . Cancer Mother        bladder  . Hypertension Mother   . Diabetes Brother   . Hypertension Maternal Grandmother   . Ovarian cancer Maternal Grandmother   . Heart attack Maternal Grandmother   . Diabetes Paternal Grandmother   . COPD Father        heavy smoker  . Prostate cancer Father   . Cancer Father        Lung  . Hypertension Father   . Breast cancer Neg Hx     Review of Systems: A 12-system review of systems was performed and was negative except as noted in the HPI.  --------------------------------------------------------------------------------------------------  Physical Exam: BP 100/82   Pulse 66   Wt 161 lb (73 kg)   SpO2 97%   BMI 24.84 kg/m   General: Well-developed, well-nourished woman, seated comfortably in the exam room. HEENT: No conjunctival pallor or scleral  icterus. Moist mucous membranes. OP clear. Neck: Supple without lymphadenopathy, thyromegaly, JVD, or HJR. No carotid bruit. Lungs: Normal work of breathing.  Coarse breath sounds without wheezes or crackles. Heart: Regular rate and rhythm without murmurs, rubs, or gallops. Non-displaced PMI. Abd: Bowel sounds present. Soft, NT/ND without hepatosplenomegaly Ext: No lower extremity edema. Radial, PT, and DP pulses are 2+ bilaterally Skin: Warm and dry without rash. Neuro: CNIII-XII intact. Strength and fine-touch sensation intact in upper and lower extremities bilaterally. Psych: Normal mood and affect.  EKG: Normal sinus rhythm without abnormalities.  Lab Results  Component Value Date   WBC 10.1 06/08/2017   HGB 15.2 (H) 06/08/2017   HCT 45.9 06/08/2017   MCV 93.5 06/08/2017   PLT 370.0 06/08/2017    Lab Results  Component Value Date   NA 140 08/10/2017   K 4.6 08/10/2017   CL 103 08/10/2017   CO2 28 08/10/2017   BUN 17 08/10/2017   CREATININE 0.72 08/10/2017   GLUCOSE 91 08/10/2017   ALT 23  08/10/2017    Lab Results  Component Value Date   CHOL 196 02/19/2015   HDL 87 02/19/2015   LDLCALC 93 02/19/2015   TRIG 80 02/19/2015   CHOLHDL 2.3 02/19/2015     --------------------------------------------------------------------------------------------------  ASSESSMENT AND PLAN: Shortness of breath and atypical chest pain Ms. Luster has attributed to her shortness of breath allergic bronchopulmonary aspergillosis.  However, symptoms became noticeably worse in Deptember without clear explanation.  She does not have any significant cardiac risk factors.  Exam today is unremarkable.  EKG is normal.  However, given chest CT in 05/2017 (personally reviewed) demonstrating mild coronary artery calcification and no obvious parenchymal lung disease to explain progressive symptoms, further cardiac workup is indicated.  We will begin with a transthoracic echocardiogram.  Based on these results, we will discuss the need for further ischemia evaluation (stress test versus catheterization).  Follow-up: Return to clinic in 2 months.  Nelva Bush, MD 08/11/2017 3:54 PM

## 2017-08-11 ENCOUNTER — Encounter: Payer: Self-pay | Admitting: Internal Medicine

## 2017-08-11 DIAGNOSIS — R0602 Shortness of breath: Secondary | ICD-10-CM | POA: Insufficient documentation

## 2017-08-11 LAB — COMPREHENSIVE METABOLIC PANEL
AG Ratio: 1.6 (calc) (ref 1.0–2.5)
ALT: 23 U/L (ref 6–29)
AST: 17 U/L (ref 10–35)
Albumin: 4 g/dL (ref 3.6–5.1)
Alkaline phosphatase (APISO): 60 U/L (ref 33–130)
BUN: 17 mg/dL (ref 7–25)
CO2: 28 mmol/L (ref 20–32)
Calcium: 9.3 mg/dL (ref 8.6–10.4)
Chloride: 103 mmol/L (ref 98–110)
Creat: 0.72 mg/dL (ref 0.50–1.05)
Globulin: 2.5 g/dL (calc) (ref 1.9–3.7)
Glucose, Bld: 91 mg/dL (ref 65–99)
Potassium: 4.6 mmol/L (ref 3.5–5.3)
Sodium: 140 mmol/L (ref 135–146)
Total Bilirubin: 0.4 mg/dL (ref 0.2–1.2)
Total Protein: 6.5 g/dL (ref 6.1–8.1)

## 2017-08-11 LAB — VITAMIN D 25 HYDROXY (VIT D DEFICIENCY, FRACTURES): Vit D, 25-Hydroxy: 33 ng/mL (ref 30–100)

## 2017-08-11 LAB — TSH: TSH: 2.99 mIU/L (ref 0.40–4.50)

## 2017-08-11 LAB — PARATHYROID HORMONE, INTACT (NO CA): PTH: 38 pg/mL (ref 14–64)

## 2017-08-17 ENCOUNTER — Ambulatory Visit (HOSPITAL_COMMUNITY): Payer: 59 | Attending: Cardiology

## 2017-08-17 ENCOUNTER — Other Ambulatory Visit: Payer: Self-pay

## 2017-08-17 DIAGNOSIS — I517 Cardiomegaly: Secondary | ICD-10-CM | POA: Insufficient documentation

## 2017-08-17 DIAGNOSIS — R0602 Shortness of breath: Secondary | ICD-10-CM

## 2017-08-18 ENCOUNTER — Telehealth: Payer: Self-pay

## 2017-08-18 DIAGNOSIS — R0602 Shortness of breath: Secondary | ICD-10-CM

## 2017-08-21 NOTE — Telephone Encounter (Signed)
Arranged dobutamine Echo

## 2017-08-22 ENCOUNTER — Ambulatory Visit: Payer: 59 | Admitting: Pulmonary Disease

## 2017-08-22 ENCOUNTER — Encounter: Payer: Self-pay | Admitting: Pulmonary Disease

## 2017-08-22 VITALS — BP 112/66 | HR 75 | Ht 67.5 in | Wt 152.6 lb

## 2017-08-22 DIAGNOSIS — J4550 Severe persistent asthma, uncomplicated: Secondary | ICD-10-CM | POA: Diagnosis not present

## 2017-08-22 DIAGNOSIS — B4489 Other forms of aspergillosis: Secondary | ICD-10-CM | POA: Diagnosis not present

## 2017-08-22 DIAGNOSIS — J309 Allergic rhinitis, unspecified: Secondary | ICD-10-CM | POA: Diagnosis not present

## 2017-08-22 NOTE — Patient Instructions (Signed)
Severe persistent asthma Start Xolair Continue Breo 1 puff daily Continue Qvar 3 puffs twice a day for the next month, after 1 month on Xolair you can reduce this to 2 puffs twice a day Use albuterol as needed for chest tightness wheezing or shortness of breath  Allergic bronchopulmonary aspergillosis: Continue regimen as above  We will see you back in 3 months

## 2017-08-22 NOTE — Progress Notes (Signed)
Subjective:    Patient ID: Kathryn Griffin, female    DOB: [birth year], 62 y.o.   MRN: [id number]  Synopsis: 62 yo female developed cough and thick mucus produciton for the first time in her life in 2013. Produced casts with coughing on several occassions and responded to antibiotic and prednisone therapy.  Bronchoscopy in 04/2013 showed mucus plugging/cast LUL. Bronch cultures grew aspergillus species. Baseline GERD.  Methacholine challenge 01/2013 negative.  IgE low/normal 2014. Eosinophil elevated one time in early 2014.  She was treated with itraconazole for a month and did well by January 2015.  HPI Chief Complaint  Patient presents with  . Follow-up    pt states she is doing well currently.  has not yet started xolair injections.     Kathryn Griffin had bronchitis and the flu over the last few months. She was diagnosed with influenza in January and took Tamiflu.  She says that she had some time off due to this.  She last had dyspnea and needed prednisone in December when she had dyspnea without chest congestion.  She didn't have a fever or cough. She hasn't ben on prednisone since then.  She was taking too much Breo recently and had some high heart rates.    QVar> currently doing three puffs bid    Past Medical History:  Diagnosis Date  . Aspergillus fumigatus ([place]) 11/14  . Hx of gestational diabetes mellitus, not currently pregnant   . Osteopenia 07/04/2017   T score -2.1 FRAX 9.6% / 1.3%  . Prediabetes   . Reflux   . Vaginal atrophy   . Vitamin D deficiency      Review of Systems  Constitutional: Negative for chills, fatigue and fever.  HENT: Negative for postnasal drip, rhinorrhea and sinus pressure.   Respiratory: Positive for cough, shortness of breath and wheezing.   Cardiovascular: Negative for chest pain, palpitations and leg swelling.         Objective:   Physical Exam Vitals:   08/22/17 0907  BP: 112/66  Pulse: 75  SpO2: 98%  Weight: 152 lb 9.6 oz (69.2 kg)   Height: 5' 7.5" (1.715 m)  RA  Gen: well appearing HENT: OP clear, TM's clear, neck supple PULM: CTA B, normal percussion CV: RRR, no mgr, trace edema GI: BS+, soft, nontender Derm: no cyanosis or rash Psyche: normal mood and affect     08/02/2013 CXR > normal   04/2013 bronch > large left upper lobe mucus plug/cast causing left upper lobe collapse 04/2013 bronch culture> aspergillus species 05/2013 IgE 156 05/2013 Apergillus precipitans > low positive 07/2013 sputum culture > negative 08/2013 sputum culture > aspergillus sp 01/2014 sputum culture > aspergillus sp  Labs: 09/2016 absolute eosinophil 200 cell/dL, IgE 227       Assessment & Plan:   Allergic rhinitis, unspecified seasonality, unspecified trigger  Severe persistent asthma without complication  Infection due to aspergillus fumigatus ([place])  Discussion: Kathryn Griffin suffered from influenza recently and had some bronchitis but fortunately she has been doing fairly well in the last few weeks.  That being said 2018 was a very difficult year for her with severe persistent asthma and recurrent exacerbations.  She will start Xolair soon which I think is a good thing to reduce the frequency of exacerbations and reduce symptom burden.  Plan: Severe persistent asthma Start Xolair Continue Breo 1 puff daily Continue Qvar 3 puffs twice a day for the next month, after 1 month on Xolair you can reduce this to  2 puffs twice a day Use albuterol as needed for chest tightness wheezing or shortness of breath  Allergic bronchopulmonary aspergillosis: Continue regimen as above  We will see you back in 3 months   Current Outpatient Medications:  .  albuterol (PROVENTIL) (2.5 MG/3ML) 0.083% nebulizer solution, Take 3 mLs (2.5 mg total) by nebulization every 6 (six) hours as needed for wheezing or shortness of breath., Disp: 360 mL, Rfl: 11 .  beclomethasone (QVAR REDIHALER) 80 MCG/ACT inhaler, Inhale 4 puffs into the lungs 2 (two)  times daily., Disp: 1 Inhaler, Rfl: 11 .  Calcium Citrate (CITRACAL PO), Take 1 tablet by mouth daily at 8 pm. , Disp: , Rfl:  .  Cholecalciferol (VITAMIN D) 2000 units CAPS, Take 1 capsule by mouth daily., Disp: , Rfl:  .  EPINEPHrine (EPIPEN 2-PAK) 0.3 mg/0.3 mL IJ SOAJ injection, Inject 0.3 mLs (0.3 mg total) into the muscle once., Disp: 1 Device, Rfl: 0 .  Estradiol 10 MCG TABS vaginal tablet, Place 1 tablet (10 mcg total) vaginally 2 (two) times a week. Every day x 2 weeks, then twice a week., Disp: 24 tablet, Rfl: 5 .  famotidine (PEPCID) 20 MG tablet, Take 20 mg by mouth as needed. , Disp: , Rfl:  .  fexofenadine (ALLEGRA) 180 MG tablet, Take 180 mg by mouth daily., Disp: , Rfl:  .  fluticasone (FLONASE) 50 MCG/ACT nasal spray, Place 2 sprays into both nostrils daily. , Disp: , Rfl:  .  fluticasone furoate-vilanterol (BREO ELLIPTA) 200-25 MCG/INH AEPB, Inhale 1 puff into the lungs daily., Disp: 60 each, Rfl: 6 .  guaiFENesin (MUCINEX) 600 MG 12 hr tablet, Take by mouth 2 (two) times daily., Disp: , Rfl:  .  meloxicam (MOBIC) 15 MG tablet, Take 1 tablet by mouth daily as needed. , Disp: , Rfl: 1 .  montelukast (SINGULAIR) 10 MG tablet, Take 1 tablet (10 mg total) by mouth daily., Disp: 30 tablet, Rfl: 2 .  Multiple Vitamin (MULTIVITAMIN) capsule, Take 1 capsule by mouth daily.  , Disp: , Rfl:  .  omeprazole (PRILOSEC) 40 MG capsule, Take 40 mg by mouth daily., Disp: , Rfl:  .  Respiratory Therapy Supplies (FLUTTER) DEVI, Use as directed, Disp: 1 each, Rfl: 0 .  sodium chloride HYPERTONIC 3 % nebulizer solution, Take by nebulization 2 (two) times daily., Disp: 750 mL, Rfl: 11 .  Spacer/Aero-Holding Chambers (AEROCHAMBER MV) inhaler, Use as instructed, Disp: 1 each, Rfl: 0 .  VENTOLIN HFA 108 (90 Base) MCG/ACT inhaler, inhale 2 puffs by mouth every 4 hours if needed, Disp: 18 g, Rfl: 5

## 2017-08-24 ENCOUNTER — Telehealth (HOSPITAL_COMMUNITY): Payer: Self-pay | Admitting: *Deleted

## 2017-08-24 NOTE — Telephone Encounter (Signed)
Patient given detailed instructions per Stress Test Requisition Sheet for test on 08/31/17 at 7:30.Patient Notified to arrive 30 minutes early, and that it is imperative to arrive on time for appointment to keep from having the test rescheduled.  Patient verbalized understanding. Kathryn Griffin

## 2017-08-29 ENCOUNTER — Telehealth: Payer: Self-pay | Admitting: Pulmonary Disease

## 2017-08-30 NOTE — Telephone Encounter (Signed)
Called and left a message with company voice mail. I also faxed that we do want the PFS, in case they don't get my message. Nothing further needed.

## 2017-08-31 ENCOUNTER — Ambulatory Visit (HOSPITAL_COMMUNITY): Payer: 59

## 2017-08-31 ENCOUNTER — Ambulatory Visit (HOSPITAL_COMMUNITY): Payer: 59 | Attending: Cardiology

## 2017-08-31 DIAGNOSIS — R0602 Shortness of breath: Secondary | ICD-10-CM | POA: Diagnosis not present

## 2017-08-31 DIAGNOSIS — R079 Chest pain, unspecified: Secondary | ICD-10-CM | POA: Diagnosis not present

## 2017-08-31 DIAGNOSIS — R000 Tachycardia, unspecified: Secondary | ICD-10-CM | POA: Insufficient documentation

## 2017-08-31 MED ORDER — SODIUM CHLORIDE 0.9 % IV SOLN
30.0000 ug/kg/min | Freq: Once | INTRAVENOUS | Status: AC
  Administered 2017-08-31: 2196 ug/min via INTRAVENOUS

## 2017-09-06 ENCOUNTER — Other Ambulatory Visit: Payer: Self-pay

## 2017-09-06 MED ORDER — ALBUTEROL SULFATE HFA 108 (90 BASE) MCG/ACT IN AERS: INHALATION_SPRAY | RESPIRATORY_TRACT | 5 refills | Status: DC

## 2017-09-06 NOTE — Telephone Encounter (Signed)
Pt. Approved for medication, Pt. Is calling to give permission to ship. Since pharmacy has not called her.

## 2017-09-08 ENCOUNTER — Telehealth: Payer: Self-pay | Admitting: Pulmonary Disease

## 2017-09-11 NOTE — Telephone Encounter (Signed)
Routing this to TS for follow up

## 2017-09-12 NOTE — Telephone Encounter (Signed)
Called pt had to leave a voice mail. I gave her the procedure code and the dx code, just in case. Nothing further needed.

## 2017-09-13 ENCOUNTER — Other Ambulatory Visit: Payer: Self-pay | Admitting: Pulmonary Disease

## 2017-09-13 ENCOUNTER — Other Ambulatory Visit: Payer: Self-pay | Admitting: Obstetrics & Gynecology

## 2017-09-19 ENCOUNTER — Telehealth: Payer: Self-pay | Admitting: Pulmonary Disease

## 2017-09-19 ENCOUNTER — Telehealth: Payer: Self-pay | Admitting: *Deleted

## 2017-09-19 NOTE — Telephone Encounter (Signed)
Xolair has been ordered. Nothing further needed.

## 2017-09-19 NOTE — Telephone Encounter (Signed)
Order has been confirmed.Please refer to 09/19/17 note.

## 2017-09-19 NOTE — Telephone Encounter (Signed)
Prefilled Syringe: #150mg  1  #75mg  1 Ordered Date: 09/19/17  Shipping Date: 09/20/17 Returned rep's called to confirm del.Marland Kitchen

## 2017-09-19 NOTE — Telephone Encounter (Signed)
Returned call, confirmed del.Arvid Right will be here 09/21/17. Will call pt. And make her aware. Pt. Is coming in 09/25/17 for her first xolair inj.. Nothing further needed.

## 2017-09-20 DIAGNOSIS — J4550 Severe persistent asthma, uncomplicated: Secondary | ICD-10-CM | POA: Diagnosis not present

## 2017-09-22 NOTE — Telephone Encounter (Signed)
#   Vials:2 Arrival Date:09/22/2017 Lot #:[id number] Exp Date:02/2021

## 2017-09-25 ENCOUNTER — Telehealth: Payer: Self-pay | Admitting: Pulmonary Disease

## 2017-09-25 ENCOUNTER — Ambulatory Visit (INDEPENDENT_AMBULATORY_CARE_PROVIDER_SITE_OTHER): Payer: 59

## 2017-09-25 DIAGNOSIS — J4550 Severe persistent asthma, uncomplicated: Secondary | ICD-10-CM

## 2017-09-25 NOTE — Telephone Encounter (Signed)
Pt. Had no reaction until an hour and 15 mins. into her 2 hr. Wait. She started having tingling in her hands, by the time her 2 hours was up she had very little tingling. Pt. Had been typing in a lobby chair for most of the 2 hrs. (Pt. Really thought it was coming from her back and typing in that chair.)  I explained to [place] what had happened, she in turn explained it to Dr. Lake Bells (he was in with another pt. At that time.), he said she was okay to leave. Nothing further needed.

## 2017-09-26 MED ORDER — OMALIZUMAB 150 MG ~~LOC~~ SOLR
225.0000 mg | Freq: Once | SUBCUTANEOUS | Status: AC
  Administered 2017-09-25: 225 mg via SUBCUTANEOUS

## 2017-09-26 NOTE — Progress Notes (Signed)
xolair

## 2017-10-04 ENCOUNTER — Telehealth: Payer: Self-pay

## 2017-10-04 DIAGNOSIS — J4550 Severe persistent asthma, uncomplicated: Secondary | ICD-10-CM | POA: Diagnosis not present

## 2017-10-04 NOTE — Telephone Encounter (Signed)
#   vials: 150mg : 2 syringes//75mg : 2 syringes Ordered date:10/04/17  Shipping Date:10/06/2017

## 2017-10-05 DIAGNOSIS — J029 Acute pharyngitis, unspecified: Secondary | ICD-10-CM | POA: Diagnosis not present

## 2017-10-06 NOTE — Telephone Encounter (Signed)
#   Vials:2 vials- only sent 2 week supply, and did not send prefilled syringes Arrival Date:10/06/17  Lot #:[id number] Exp Date:02/25/2021

## 2017-10-09 ENCOUNTER — Telehealth: Payer: Self-pay | Admitting: Pulmonary Disease

## 2017-10-09 NOTE — Telephone Encounter (Signed)
Pt. Is aware of McQ's recs.. She is keeping her appt. In the morning. Nothing further needed.

## 2017-10-09 NOTE — Telephone Encounter (Signed)
Routing to TS to follow up on.  

## 2017-10-09 NOTE — Telephone Encounter (Signed)
Pt is calling back. Pt needs to know if she can still come in for INJ. Pt has been on ABX since 4/12 for Strep Throat.  Cb is [phone].

## 2017-10-09 NOTE — Telephone Encounter (Signed)
Please let me know, as soon as you can so I can call the pt back. So she can know wether she needs to take 1/2 a day off or not.

## 2017-10-09 NOTE — Telephone Encounter (Signed)
Per BQ: Pt is ok to receive injection tomorrow.  Thanks.

## 2017-10-09 NOTE — Telephone Encounter (Signed)
Dr. Lake Bells , please advise if it okay for Kathryn Griffin to get her xolair injection tomorrow. Pt. Has been on ABX since 4/12 for strep throat. Pt. Has not run a fever, she has 7 more of amoxicillin.

## 2017-10-10 ENCOUNTER — Ambulatory Visit (INDEPENDENT_AMBULATORY_CARE_PROVIDER_SITE_OTHER): Payer: 59

## 2017-10-10 DIAGNOSIS — J4540 Moderate persistent asthma, uncomplicated: Secondary | ICD-10-CM | POA: Diagnosis not present

## 2017-10-11 MED ORDER — OMALIZUMAB 150 MG ~~LOC~~ SOLR
225.0000 mg | SUBCUTANEOUS | Status: DC
  Administered 2017-10-10: 225 mg via SUBCUTANEOUS

## 2017-10-11 NOTE — Progress Notes (Signed)
Documentation of medication administration and charges of Xolair have been completed by Lindsay Lemons, CMA based on the Xolair documentation sheet completed by Tammy Scott.  

## 2017-10-12 ENCOUNTER — Ambulatory Visit (INDEPENDENT_AMBULATORY_CARE_PROVIDER_SITE_OTHER): Payer: 59 | Admitting: Internal Medicine

## 2017-10-12 ENCOUNTER — Encounter: Payer: Self-pay | Admitting: Internal Medicine

## 2017-10-12 VITALS — BP 112/74 | HR 67 | Ht 67.5 in | Wt 157.4 lb

## 2017-10-12 DIAGNOSIS — R0602 Shortness of breath: Secondary | ICD-10-CM | POA: Diagnosis not present

## 2017-10-12 NOTE — Progress Notes (Signed)
Follow-up Outpatient Visit Date: 10/12/2017  Primary Care Provider: Darcus Austin, MD [facility] [employer] [address] [address]  Chief Complaint: Follow-up shortness of breath  HPI:  Kathryn Griffin is a 62 y.o. year-old female with history of allergic bronchopulmonary aspergillosis and gestational diabetes, who presents for follow-up of shortness of breath.  I met her 2 months ago, at which time she reported progressive shortness of breath.  Subsequent echo showed mild LVH but otherwise no significant abnormalities.  Dobutamine stress echo was normal without evidence of ischemia.  Today, Kathryn Griffin reports that her breathing is considerably better.  She has not had any further "crazy" episodes since December.  She thinks that the addition of Memory Dance has helped the most.  She also recently began Xolair injections.  She denies chest pain, lightheadedness, edema, and orthopnea.  She has brief palpitations after taking Breo, lasting 5 minutes or less without associated symptoms.  She is currently being treated for Strep throat.   --------------------------------------------------------------------------------------------------  Cardiovascular History & Procedures: Cardiovascular Problems:  Shortness of breath  Risk Factors:  None  Cath/PCI:  None  CV Surgery:  None  EP Procedures and Devices:  None  Non-Invasive Evaluation(s):  DSE (08/31/17): Normal study without echocardiographic evidence of ischemia.  TTE (08/17/17): Normal LV size with mild LVH.  LVEF 55-60% with normal wall motion and grade 1 diastolic dysfunction.  Normal RV size and function.  Normal PA and central venous pressures.   Recent CV Pertinent Labs: Lab Results  Component Value Date   CHOL 196 02/19/2015   HDL 87 02/19/2015   LDLCALC 93 02/19/2015   TRIG 80 02/19/2015   CHOLHDL 2.3 02/19/2015   INR 0.94 12/04/2014   K 4.6 08/10/2017   MG 2.4 05/25/2013   BUN 17 08/10/2017   CREATININE  0.72 08/10/2017    Past medical and surgical history were reviewed and updated in EPIC.  Current Meds  Medication Sig  . albuterol (PROVENTIL) (2.5 MG/3ML) 0.083% nebulizer solution Take 3 mLs (2.5 mg total) by nebulization every 6 (six) hours as needed for wheezing or shortness of breath.  Marland Kitchen albuterol (VENTOLIN HFA) 108 (90 Base) MCG/ACT inhaler inhale 2 puffs by mouth every 4 hours if needed  . beclomethasone (QVAR REDIHALER) 80 MCG/ACT inhaler Inhale 4 puffs into the lungs 2 (two) times daily.  . Calcium Citrate (CITRACAL PO) Take 1 tablet by mouth daily at 8 pm.   . Cholecalciferol (VITAMIN D) 2000 units CAPS Take 1 capsule by mouth daily.  Marland Kitchen EPINEPHrine (EPIPEN 2-PAK) 0.3 mg/0.3 mL IJ SOAJ injection Inject 0.3 mLs (0.3 mg total) into the muscle once.  . Estradiol 10 MCG TABS vaginal tablet PLACE 1 TABLET VAGINALLY EVERY DAY X 2 WEEKS, THEN TWICE A WEEK  . famotidine (PEPCID) 20 MG tablet Take 20 mg by mouth as needed.   . fexofenadine (ALLEGRA) 180 MG tablet Take 180 mg by mouth daily.  . fluticasone (FLONASE) 50 MCG/ACT nasal spray Place 2 sprays into both nostrils daily.   . fluticasone furoate-vilanterol (BREO ELLIPTA) 200-25 MCG/INH AEPB Inhale 1 puff into the lungs daily.  Marland Kitchen guaiFENesin (MUCINEX) 600 MG 12 hr tablet Take by mouth 2 (two) times daily.  . meloxicam (MOBIC) 15 MG tablet Take 1 tablet by mouth daily as needed.   . montelukast (SINGULAIR) 10 MG tablet TAKE 1 TABLET(10 MG) BY MOUTH DAILY  . Multiple Vitamin (MULTIVITAMIN) capsule Take 1 capsule by mouth daily.    Marland Kitchen omeprazole (PRILOSEC) 40 MG capsule  Take 40 mg by mouth daily.  Marland Kitchen Respiratory Therapy Supplies (FLUTTER) DEVI Use as directed  . sodium chloride HYPERTONIC 3 % nebulizer solution Take by nebulization 2 (two) times daily.  Marland Kitchen Spacer/Aero-Holding Chambers (AEROCHAMBER MV) inhaler Use as instructed   Current Facility-Administered Medications for the 10/12/17 encounter (Office Visit) with Deb Loudin, Harrell Gave, MD    Medication  . omalizumab Arvid Right) injection 225 mg    Allergies: Molds & smuts; Dexilant [dexlansoprazole]; and Protonix [pantoprazole sodium]  Social History   Occupational History  . Occupation: Personnel officer: [employer]  Tobacco Use  . Smoking status: Never Smoker  . Smokeless tobacco: Never Used  Substance and Sexual Activity  . Alcohol use: Yes    Alcohol/week: 2.4 oz    Types: 4 Glasses of wine per week  . Drug use: No   Family History  Problem Relation Age of Onset  . Cancer Mother        bladder  . Hypertension Mother   . Diabetes Brother   . Hypertension Maternal Grandmother   . Ovarian cancer Maternal Grandmother   . Heart attack Maternal Grandmother   . Diabetes Paternal Grandmother   . COPD Father        heavy smoker  . Prostate cancer Father   . Cancer Father        Lung  . Hypertension Father   . Breast cancer Neg Hx     Review of Systems: A 12-system review of systems was performed and was negative except as noted in the HPI.  --------------------------------------------------------------------------------------------------  Physical Exam: BP 112/74   Pulse 67   Ht 5' 7.5" (1.715 m)   Wt 157 lb 6.4 oz (71.4 kg)   BMI 24.29 kg/m   General:  NAD HEENT: No conjunctival pallor or scleral icterus. Moist mucous membranes.  OP clear. Neck: Supple without lymphadenopathy, thyromegaly, JVD, or HJR. Lungs: Normal work of breathing. Clear to auscultation bilaterally without wheezes or crackles. Heart: Regular rate and rhythm without murmurs, rubs, or gallops. Non-displaced PMI. Abd: Bowel sounds present. Soft, NT/ND. Ext: No lower extremity edema.  Lab Results  Component Value Date   WBC 10.1 06/08/2017   HGB 15.2 (H) 06/08/2017   HCT 45.9 06/08/2017   MCV 93.5 06/08/2017   PLT 370.0 06/08/2017    Lab Results  Component Value Date   NA 140 08/10/2017   K 4.6 08/10/2017   CL 103 08/10/2017   CO2 28 08/10/2017   BUN  17 08/10/2017   CREATININE 0.72 08/10/2017   GLUCOSE 91 08/10/2017   ALT 23 08/10/2017    Lab Results  Component Value Date   CHOL 196 02/19/2015   HDL 87 02/19/2015   LDLCALC 93 02/19/2015   TRIG 80 02/19/2015   CHOLHDL 2.3 02/19/2015   --------------------------------------------------------------------------------------------------  ASSESSMENT AND PLAN: Shortness of breath Improved since last visit and most likely due to asthma and ABPA.  Resting and dobutamine stress echos were reassuring and do not suggest an underlying cardiac cause for her shortness of breath.  She should continue with primary prevention.  No further cardiac testing or intervention is recommended at this time.  Follow-up: Return to clinic as needed.  Nelva Bush, MD 10/12/2017 3:22 PM

## 2017-10-12 NOTE — Patient Instructions (Addendum)
Medication Instructions:  Your physician recommends that you continue on your current medications as directed. Please refer to the Current Medication list given to you today.  -- If you need a refill on your cardiac medications before your next appointment, please call your pharmacy. --  Labwork: None ordered  Testing/Procedures: None ordered  Follow-Up:  Your physician wants you to follow-up in: AS NEEDED with Dr. Saunders Revel.      Thank you for choosing [facility]!!    Any Other Special Instructions Will Be Listed Below (If Applicable).

## 2017-10-13 DIAGNOSIS — S39012A Strain of muscle, fascia and tendon of lower back, initial encounter: Secondary | ICD-10-CM | POA: Diagnosis not present

## 2017-10-18 ENCOUNTER — Telehealth: Payer: Self-pay | Admitting: Pulmonary Disease

## 2017-10-18 NOTE — Telephone Encounter (Signed)
Pharmacy is having to contact patient to ship Xolair   # vials:2 Ordered date:4.24.19 Shipping Date: 4.26.19 ONLY IF PT CALLS BACK TOMORROW TO [place]

## 2017-10-24 ENCOUNTER — Telehealth: Payer: Self-pay | Admitting: Pulmonary Disease

## 2017-10-24 DIAGNOSIS — J4550 Severe persistent asthma, uncomplicated: Secondary | ICD-10-CM | POA: Diagnosis not present

## 2017-10-24 NOTE — Telephone Encounter (Signed)
#   vials:2 Ordered date:10/24/2017 Shipping Date:10/24/2017

## 2017-10-24 NOTE — Telephone Encounter (Signed)
I spoke to pharmacy and pt.Kathryn Griffin says they have not been able to reach pt.. Pt. Said they have not tried to call her. I gave pt. The # she called and gave her consent. Pharmacy called me back and we set up del. For 10/25/17. Pt had an appt. Early tomorrow morning, I advised her to change her appt. To late morning or early afternoon. Meds. Don't come in til 10:00 or 10:30 sometimes later. Pt. rsc for 3:45 tomorrow. Nothing further needed.

## 2017-10-25 ENCOUNTER — Ambulatory Visit: Payer: 59

## 2017-10-25 NOTE — Telephone Encounter (Signed)
#   Vials:2 Arrival Date:10/25/2017 Lot #:[id number] Exp Date:02/2021

## 2017-10-26 ENCOUNTER — Ambulatory Visit (INDEPENDENT_AMBULATORY_CARE_PROVIDER_SITE_OTHER): Payer: 59

## 2017-10-26 DIAGNOSIS — J4540 Moderate persistent asthma, uncomplicated: Secondary | ICD-10-CM | POA: Diagnosis not present

## 2017-10-27 MED ORDER — OMALIZUMAB 150 MG ~~LOC~~ SOLR
225.0000 mg | Freq: Once | SUBCUTANEOUS | Status: AC
  Administered 2017-10-26: 225 mg via SUBCUTANEOUS

## 2017-11-01 DIAGNOSIS — M5412 Radiculopathy, cervical region: Secondary | ICD-10-CM | POA: Diagnosis not present

## 2017-11-01 DIAGNOSIS — M4312 Spondylolisthesis, cervical region: Secondary | ICD-10-CM | POA: Diagnosis not present

## 2017-11-01 DIAGNOSIS — M47812 Spondylosis without myelopathy or radiculopathy, cervical region: Secondary | ICD-10-CM | POA: Diagnosis not present

## 2017-11-01 DIAGNOSIS — M545 Low back pain: Secondary | ICD-10-CM | POA: Diagnosis not present

## 2017-11-01 DIAGNOSIS — M542 Cervicalgia: Secondary | ICD-10-CM | POA: Diagnosis not present

## 2017-11-08 ENCOUNTER — Telehealth: Payer: Self-pay | Admitting: Pulmonary Disease

## 2017-11-08 DIAGNOSIS — J4550 Severe persistent asthma, uncomplicated: Secondary | ICD-10-CM | POA: Diagnosis not present

## 2017-11-08 NOTE — Telephone Encounter (Signed)
#   vials:2 Ordered date:11/08/17 Shipping Date:11/09/17

## 2017-11-10 NOTE — Telephone Encounter (Signed)
#   Vials:2 Arrival Date:11/10/2017 Lot #:[id number] Exp Date:10/2020

## 2017-11-13 ENCOUNTER — Ambulatory Visit (INDEPENDENT_AMBULATORY_CARE_PROVIDER_SITE_OTHER): Payer: 59

## 2017-11-13 DIAGNOSIS — J4540 Moderate persistent asthma, uncomplicated: Secondary | ICD-10-CM

## 2017-11-14 MED ORDER — OMALIZUMAB 150 MG ~~LOC~~ SOLR
225.0000 mg | SUBCUTANEOUS | Status: DC
Start: 2017-11-13 — End: 2018-03-14
  Administered 2017-11-13: 225 mg via SUBCUTANEOUS

## 2017-11-14 NOTE — Progress Notes (Signed)
Documentation of medication administration and charges of Xolair have been completed by Delando Satter, CMA based on the Xolair documentation sheet completed by Ashley Caulfield, RMA. 

## 2017-11-16 DIAGNOSIS — R293 Abnormal posture: Secondary | ICD-10-CM | POA: Diagnosis not present

## 2017-11-16 DIAGNOSIS — M6281 Muscle weakness (generalized): Secondary | ICD-10-CM | POA: Diagnosis not present

## 2017-11-16 DIAGNOSIS — M5412 Radiculopathy, cervical region: Secondary | ICD-10-CM | POA: Diagnosis not present

## 2017-11-21 ENCOUNTER — Telehealth: Payer: Self-pay | Admitting: Pulmonary Disease

## 2017-11-21 DIAGNOSIS — J4550 Severe persistent asthma, uncomplicated: Secondary | ICD-10-CM | POA: Diagnosis not present

## 2017-11-21 NOTE — Telephone Encounter (Signed)
#   vials:2 Ordered date:11/21/17 Shipping Date:11/23/17

## 2017-11-24 NOTE — Telephone Encounter (Signed)
#   Vials:2 Arrival Date:11/24/17 Lot #:[id number] Exp GFRE:08/2001

## 2017-11-27 ENCOUNTER — Ambulatory Visit (INDEPENDENT_AMBULATORY_CARE_PROVIDER_SITE_OTHER): Payer: 59

## 2017-11-27 ENCOUNTER — Other Ambulatory Visit: Payer: Self-pay | Admitting: Obstetrics & Gynecology

## 2017-11-27 DIAGNOSIS — J4540 Moderate persistent asthma, uncomplicated: Secondary | ICD-10-CM

## 2017-11-27 DIAGNOSIS — Z1231 Encounter for screening mammogram for malignant neoplasm of breast: Secondary | ICD-10-CM

## 2017-11-28 MED ORDER — OMALIZUMAB 150 MG ~~LOC~~ SOLR
225.0000 mg | Freq: Once | SUBCUTANEOUS | Status: AC
  Administered 2017-11-27: 225 mg via SUBCUTANEOUS

## 2017-11-29 DIAGNOSIS — M542 Cervicalgia: Secondary | ICD-10-CM | POA: Diagnosis not present

## 2017-11-29 DIAGNOSIS — R293 Abnormal posture: Secondary | ICD-10-CM | POA: Diagnosis not present

## 2017-11-29 DIAGNOSIS — M5412 Radiculopathy, cervical region: Secondary | ICD-10-CM | POA: Diagnosis not present

## 2017-12-04 DIAGNOSIS — M5412 Radiculopathy, cervical region: Secondary | ICD-10-CM | POA: Diagnosis not present

## 2017-12-04 DIAGNOSIS — M542 Cervicalgia: Secondary | ICD-10-CM | POA: Diagnosis not present

## 2017-12-04 DIAGNOSIS — M6281 Muscle weakness (generalized): Secondary | ICD-10-CM | POA: Diagnosis not present

## 2017-12-06 ENCOUNTER — Telehealth: Payer: Self-pay | Admitting: Pulmonary Disease

## 2017-12-06 DIAGNOSIS — M5412 Radiculopathy, cervical region: Secondary | ICD-10-CM | POA: Diagnosis not present

## 2017-12-06 DIAGNOSIS — M6281 Muscle weakness (generalized): Secondary | ICD-10-CM | POA: Diagnosis not present

## 2017-12-06 DIAGNOSIS — J4550 Severe persistent asthma, uncomplicated: Secondary | ICD-10-CM | POA: Diagnosis not present

## 2017-12-06 DIAGNOSIS — M542 Cervicalgia: Secondary | ICD-10-CM | POA: Diagnosis not present

## 2017-12-06 NOTE — Telephone Encounter (Signed)
#   vials:2 Ordered date:12/06/17 Shipping Date:12/06/17

## 2017-12-08 NOTE — Telephone Encounter (Signed)
#   Vials:2 Arrival Date:12/08/17 Lot #:[id number] Exp Date:02/2021

## 2017-12-11 ENCOUNTER — Telehealth: Payer: Self-pay | Admitting: Pulmonary Disease

## 2017-12-11 ENCOUNTER — Ambulatory Visit (INDEPENDENT_AMBULATORY_CARE_PROVIDER_SITE_OTHER): Payer: 59

## 2017-12-11 DIAGNOSIS — J4540 Moderate persistent asthma, uncomplicated: Secondary | ICD-10-CM

## 2017-12-11 DIAGNOSIS — M5412 Radiculopathy, cervical region: Secondary | ICD-10-CM | POA: Diagnosis not present

## 2017-12-11 DIAGNOSIS — M542 Cervicalgia: Secondary | ICD-10-CM | POA: Diagnosis not present

## 2017-12-11 DIAGNOSIS — M6281 Muscle weakness (generalized): Secondary | ICD-10-CM | POA: Diagnosis not present

## 2017-12-11 NOTE — Telephone Encounter (Signed)
Called and spoke with pt letting her know that BQ stated xolair can be every three weeks now and then at upcoming [place] 7/25, they can decide at that time about every month.  Pt expressed understanding. Nothing further needed at this time.

## 2017-12-11 NOTE — Telephone Encounter (Signed)
Can go to every three weeks now Will decide about every month when I see her in [place] on 7/25

## 2017-12-11 NOTE — Telephone Encounter (Signed)
LMTCB

## 2017-12-11 NOTE — Telephone Encounter (Signed)
Spoke with the pt  She is asking when she can go to monthly xolair injections  She was overdue f/u, and so I scheduled her for first available with BQ for 7/25  Please advise thanks

## 2017-12-12 DIAGNOSIS — Z800 Family history of malignant neoplasm of digestive organs: Secondary | ICD-10-CM | POA: Diagnosis not present

## 2017-12-12 DIAGNOSIS — K219 Gastro-esophageal reflux disease without esophagitis: Secondary | ICD-10-CM | POA: Diagnosis not present

## 2017-12-12 MED ORDER — OMALIZUMAB 150 MG ~~LOC~~ SOLR
225.0000 mg | SUBCUTANEOUS | Status: DC
  Administered 2017-12-11: 225 mg via SUBCUTANEOUS

## 2017-12-12 NOTE — Progress Notes (Signed)
Documentation of medication administration and charges of Xolair have been completed by Lindsay Lemons, CMA based on the Xolair documentation sheet completed by Tammy Scott.  

## 2017-12-13 DIAGNOSIS — M542 Cervicalgia: Secondary | ICD-10-CM | POA: Diagnosis not present

## 2017-12-13 DIAGNOSIS — M6281 Muscle weakness (generalized): Secondary | ICD-10-CM | POA: Diagnosis not present

## 2017-12-13 DIAGNOSIS — M5412 Radiculopathy, cervical region: Secondary | ICD-10-CM | POA: Diagnosis not present

## 2017-12-18 DIAGNOSIS — R293 Abnormal posture: Secondary | ICD-10-CM | POA: Diagnosis not present

## 2017-12-18 DIAGNOSIS — M542 Cervicalgia: Secondary | ICD-10-CM | POA: Diagnosis not present

## 2017-12-18 DIAGNOSIS — M5412 Radiculopathy, cervical region: Secondary | ICD-10-CM | POA: Diagnosis not present

## 2017-12-20 DIAGNOSIS — M542 Cervicalgia: Secondary | ICD-10-CM | POA: Diagnosis not present

## 2017-12-20 DIAGNOSIS — M5412 Radiculopathy, cervical region: Secondary | ICD-10-CM | POA: Diagnosis not present

## 2017-12-20 DIAGNOSIS — R293 Abnormal posture: Secondary | ICD-10-CM | POA: Diagnosis not present

## 2017-12-21 ENCOUNTER — Telehealth: Payer: Self-pay | Admitting: Pulmonary Disease

## 2017-12-21 DIAGNOSIS — J4550 Severe persistent asthma, uncomplicated: Secondary | ICD-10-CM | POA: Diagnosis not present

## 2017-12-21 NOTE — Telephone Encounter (Signed)
Routing to Tammy Scott. 

## 2017-12-21 NOTE — Telephone Encounter (Signed)
#   vials:2 Ordered date:12/21/17 Shipping Date:12/21/17

## 2017-12-22 ENCOUNTER — Telehealth: Payer: Self-pay | Admitting: Pulmonary Disease

## 2017-12-22 ENCOUNTER — Ambulatory Visit
Admission: RE | Admit: 2017-12-22 | Discharge: 2017-12-22 | Disposition: A | Discharge status: Home / Self Care | Payer: 59 | Source: Ambulatory Visit | Attending: Obstetrics & Gynecology | Admitting: Obstetrics & Gynecology

## 2017-12-22 DIAGNOSIS — Z1231 Encounter for screening mammogram for malignant neoplasm of breast: Secondary | ICD-10-CM

## 2017-12-22 MED ORDER — BECLOMETHASONE DIPROP HFA 80 MCG/ACT IN AERB: 2.0000 | INHALATION_SPRAY | Freq: Two times a day (BID) | RESPIRATORY_TRACT | 1 refills | Status: DC

## 2017-12-22 NOTE — Telephone Encounter (Signed)
#   Vials:2 Arrival Date:12/22/17 Lot #:[id number] Exp Date:02/2021

## 2017-12-22 NOTE — Telephone Encounter (Signed)
Pt is aware that we have not received any refill requests from pharmacy as of today-I have sent refill to pharmacy for QVAR and reminded patient to keep her OV 12-2017 with BQ. Nothing more needed at this time.

## 2018-01-01 ENCOUNTER — Ambulatory Visit (INDEPENDENT_AMBULATORY_CARE_PROVIDER_SITE_OTHER): Payer: 59

## 2018-01-01 DIAGNOSIS — M542 Cervicalgia: Secondary | ICD-10-CM | POA: Diagnosis not present

## 2018-01-01 DIAGNOSIS — M5412 Radiculopathy, cervical region: Secondary | ICD-10-CM | POA: Diagnosis not present

## 2018-01-01 DIAGNOSIS — J4540 Moderate persistent asthma, uncomplicated: Secondary | ICD-10-CM | POA: Diagnosis not present

## 2018-01-01 DIAGNOSIS — R293 Abnormal posture: Secondary | ICD-10-CM | POA: Diagnosis not present

## 2018-01-02 MED ORDER — OMALIZUMAB 150 MG ~~LOC~~ SOLR
225.0000 mg | SUBCUTANEOUS | Status: DC
  Administered 2018-01-01: 225 mg via SUBCUTANEOUS

## 2018-01-02 NOTE — Progress Notes (Signed)
Documentation of medication administration and charges of Xolair have been completed by Lindsay Lemons, CMA based on the Xolair documentation sheet completed by Tammy Scott.  

## 2018-01-03 DIAGNOSIS — R293 Abnormal posture: Secondary | ICD-10-CM | POA: Diagnosis not present

## 2018-01-03 DIAGNOSIS — M5412 Radiculopathy, cervical region: Secondary | ICD-10-CM | POA: Diagnosis not present

## 2018-01-03 DIAGNOSIS — M542 Cervicalgia: Secondary | ICD-10-CM | POA: Diagnosis not present

## 2018-01-08 DIAGNOSIS — R293 Abnormal posture: Secondary | ICD-10-CM | POA: Diagnosis not present

## 2018-01-08 DIAGNOSIS — M5412 Radiculopathy, cervical region: Secondary | ICD-10-CM | POA: Diagnosis not present

## 2018-01-08 DIAGNOSIS — M542 Cervicalgia: Secondary | ICD-10-CM | POA: Diagnosis not present

## 2018-01-11 ENCOUNTER — Telehealth: Payer: Self-pay | Admitting: Pulmonary Disease

## 2018-01-11 DIAGNOSIS — J4550 Severe persistent asthma, uncomplicated: Secondary | ICD-10-CM | POA: Diagnosis not present

## 2018-01-11 NOTE — Telephone Encounter (Signed)
#   vials:2 Ordered date:01/11/18 Shipping Date:01/14/18

## 2018-01-15 NOTE — Telephone Encounter (Signed)
#   Vials:2 Arrival Date:01/15/18 Lot #: [id number] Exp IRSW:10/4625

## 2018-01-17 ENCOUNTER — Telehealth: Payer: Self-pay | Admitting: *Deleted

## 2018-01-17 DIAGNOSIS — M4802 Spinal stenosis, cervical region: Secondary | ICD-10-CM | POA: Diagnosis not present

## 2018-01-17 NOTE — Telephone Encounter (Signed)
Patient uses vagifem 10 mcg vaginal tablet twice weekly, has been doing well with this, however she is c/o outer labia thinning, states this area of skin rips open ( does not bleed) during intercourse with wiping after urination. Has used Vaseline and Replens to outer labia but no relief. She asked if you have any recommendations? Please advise

## 2018-01-18 ENCOUNTER — Encounter: Payer: Self-pay | Admitting: Pulmonary Disease

## 2018-01-18 ENCOUNTER — Ambulatory Visit (INDEPENDENT_AMBULATORY_CARE_PROVIDER_SITE_OTHER): Payer: 59 | Admitting: Pulmonary Disease

## 2018-01-18 VITALS — BP 124/76 | HR 69 | Ht 67.5 in | Wt 156.0 lb

## 2018-01-18 DIAGNOSIS — J309 Allergic rhinitis, unspecified: Secondary | ICD-10-CM

## 2018-01-18 DIAGNOSIS — J4550 Severe persistent asthma, uncomplicated: Secondary | ICD-10-CM | POA: Diagnosis not present

## 2018-01-18 DIAGNOSIS — B4489 Other forms of aspergillosis: Secondary | ICD-10-CM | POA: Diagnosis not present

## 2018-01-18 MED ORDER — ESTRADIOL 0.1 MG/GM VA CREA: TOPICAL_CREAM | VAGINAL | 2 refills | Status: DC

## 2018-01-18 NOTE — Telephone Encounter (Signed)
Patient informed, Rx sent to for estrace.

## 2018-01-18 NOTE — Progress Notes (Signed)
Subjective:    Patient ID: Kathryn Griffin, female    DOB: 1956/05/23, 62 y.o.   MRN: [id number]  Synopsis: 62 yo female developed cough and thick mucus produciton for the first time in her life in 2013. Produced casts with coughing on several occassions and responded to antibiotic and prednisone therapy.  Bronchoscopy in 04/2013 showed mucus plugging/cast LUL. Bronch cultures grew aspergillus species. Baseline GERD.  Methacholine challenge 01/2013 negative.  IgE low/normal 2014. Eosinophil elevated one time in early 2014.  She was treated with itraconazole for a month and did well by January 2015. We started Xolair in 08/2017  HPI Chief Complaint  Patient presents with  . Follow-up    pt states breathing is doing well. no sx or concerns today   Kathryn Griffin has been doing really well.  She says that she has not had problems with chest tightness wheezing or shortness of breath and she is not producing any mucus.  She is not having to use albuterol.  She takes Qvar regularly.  She is compliant with her every 3 weeks Xolair injections.  She says that some days she will forget to use Breo and she does not notice any difference in symptoms.  She has actually stopped taking her nasal therapy as well.   Past Medical History:  Diagnosis Date  . Aspergillus fumigatus ([place]) 11/14  . Hx of gestational diabetes mellitus, not currently pregnant   . Osteopenia 07/04/2017   T score -2.1 FRAX 9.6% / 1.3%  . Prediabetes   . Reflux   . Vaginal atrophy   . Vitamin D deficiency      Review of Systems  Constitutional: Negative for chills, fatigue and fever.  HENT: Negative for postnasal drip, rhinorrhea and sinus pressure.   Respiratory: Positive for cough, shortness of breath and wheezing.   Cardiovascular: Negative for chest pain, palpitations and leg swelling.         Objective:   Physical Exam Vitals:   01/18/18 1117  BP: 124/76  Pulse: 69  SpO2: 97%  Weight: 156 lb (70.8 kg)  Height: 5'  7.5" (1.715 m)  RA  Gen: well appearing HENT: OP clear, TM's clear, neck supple PULM: CTA B, normal percussion CV: RRR, no mgr, trace edema GI: BS+, soft, nontender Derm: no cyanosis or rash Psyche: normal mood and affect   08/02/2013 CXR > normal   04/2013 bronch > large left upper lobe mucus plug/cast causing left upper lobe collapse 04/2013 bronch culture> aspergillus species 05/2013 IgE 156 05/2013 Apergillus precipitans > low positive 07/2013 sputum culture > negative 08/2013 sputum culture > aspergillus sp 01/2014 sputum culture > aspergillus sp  Labs: 09/2016 absolute eosinophil 200 cell/dL, IgE 227       Assessment & Plan:   Severe persistent asthma without complication  Allergic rhinitis, unspecified seasonality, unspecified trigger  Infection due to aspergillus fumigatus ([place])  Discussion: Rebekah has done really well since we started Xolair.  She has not had an exacerbation and her overall allergic symptoms have greatly improved.  We will have her start backing down on her other controller therapy for her severe persistent asthma complicated by aspergillus colonization.  Plan: Allergic rhinitis: From my standpoint if you are not having symptoms it is okay to stay off of the Flonase for now  Severe persistent asthma with Aspergillus colonization: Stop Breo Continue Qvar 2 puffs twice a day If you have increased chest congestion tightness or shortness of breath and have to use albuterol more than  twice per week please call me and let me know Continue Xolair every 3 weeks We may consider decreasing the frequency of Xolair in March 2020  Follow-up with me in November 2019 or sooner if needed   Current Outpatient Medications:  .  albuterol (PROVENTIL) (2.5 MG/3ML) 0.083% nebulizer solution, Take 3 mLs (2.5 mg total) by nebulization every 6 (six) hours as needed for wheezing or shortness of breath., Disp: 360 mL, Rfl: 11 .  albuterol (VENTOLIN HFA) 108 (90 Base)  MCG/ACT inhaler, inhale 2 puffs by mouth every 4 hours if needed, Disp: 18 g, Rfl: 5 .  beclomethasone (QVAR REDIHALER) 80 MCG/ACT inhaler, Inhale 2 puffs into the lungs 2 (two) times daily., Disp: 1 Inhaler, Rfl: 1 .  Calcium Citrate (CITRACAL PO), Take 1 tablet by mouth daily at 8 pm. , Disp: , Rfl:  .  Cholecalciferol (VITAMIN D) 2000 units CAPS, Take 1 capsule by mouth daily., Disp: , Rfl:  .  EPINEPHrine (EPIPEN 2-PAK) 0.3 mg/0.3 mL IJ SOAJ injection, Inject 0.3 mLs (0.3 mg total) into the muscle once., Disp: 1 Device, Rfl: 0 .  Estradiol 10 MCG TABS vaginal tablet, PLACE 1 TABLET VAGINALLY EVERY DAY X 2 WEEKS, THEN TWICE A WEEK, Disp: 24 tablet, Rfl: 3 .  famotidine (PEPCID) 20 MG tablet, Take 20 mg by mouth as needed. , Disp: , Rfl:  .  fexofenadine (ALLEGRA) 180 MG tablet, Take 180 mg by mouth daily., Disp: , Rfl:  .  fluticasone (FLONASE) 50 MCG/ACT nasal spray, Place 2 sprays into both nostrils daily. , Disp: , Rfl:  .  fluticasone furoate-vilanterol (BREO ELLIPTA) 200-25 MCG/INH AEPB, Inhale 1 puff into the lungs daily., Disp: 60 each, Rfl: 6 .  guaiFENesin (MUCINEX) 600 MG 12 hr tablet, Take by mouth 2 (two) times daily., Disp: , Rfl:  .  meloxicam (MOBIC) 15 MG tablet, Take 1 tablet by mouth daily as needed. , Disp: , Rfl: 1 .  montelukast (SINGULAIR) 10 MG tablet, TAKE 1 TABLET(10 MG) BY MOUTH DAILY, Disp: 30 tablet, Rfl: 11 .  Multiple Vitamin (MULTIVITAMIN) capsule, Take 1 capsule by mouth daily.  , Disp: , Rfl:  .  omeprazole (PRILOSEC) 40 MG capsule, Take 40 mg by mouth daily., Disp: , Rfl:  .  Respiratory Therapy Supplies (FLUTTER) DEVI, Use as directed, Disp: 1 each, Rfl: 0 .  sodium chloride HYPERTONIC 3 % nebulizer solution, Take by nebulization 2 (two) times daily., Disp: 750 mL, Rfl: 11 .  Spacer/Aero-Holding Chambers (AEROCHAMBER MV) inhaler, Use as instructed, Disp: 1 each, Rfl: 0  Current Facility-Administered Medications:  .  omalizumab (XOLAIR) injection 225 mg, 225  mg, Subcutaneous, Q14 Days, Jayliah Benett B, MD, 225 mg at 10/10/17 0853 .  omalizumab Arvid Right) injection 225 mg, 225 mg, Subcutaneous, Q14 Days, Caridad Silveira B, MD, 225 mg at 11/13/17 1109 .  omalizumab Arvid Right) injection 225 mg, 225 mg, Subcutaneous, Q14 Days, Jaielle Dlouhy B, MD, 225 mg at 12/11/17 1032 .  omalizumab Arvid Right) injection 225 mg, 225 mg, Subcutaneous, Q14 Days, Simonne Maffucci B, MD, 225 mg at 01/01/18 [phone]

## 2018-01-18 NOTE — Patient Instructions (Signed)
Allergic rhinitis: From my standpoint if you are not having symptoms it is okay to stay off of the Flonase for now  Severe persistent asthma with Aspergillus colonization: Stop Breo Continue Qvar 2 puffs twice a day If you have increased chest congestion tightness or shortness of breath and have to use albuterol more than twice per week please call me and let me know Continue Xolair every 3 weeks We may consider decreasing the frequency of Xolair in March 2020  Follow-up with me in November 2019 or sooner if needed

## 2018-01-18 NOTE — Telephone Encounter (Signed)
Could stop Vagifem and switch to Estrace or Premarin (recommend the one cheapest for patient) cream 1/4 of an applicator with a thin layer of cream on the vulva twice a week.

## 2018-01-22 ENCOUNTER — Ambulatory Visit (INDEPENDENT_AMBULATORY_CARE_PROVIDER_SITE_OTHER): Payer: 59

## 2018-01-22 DIAGNOSIS — J4540 Moderate persistent asthma, uncomplicated: Secondary | ICD-10-CM

## 2018-01-23 MED ORDER — OMALIZUMAB 150 MG ~~LOC~~ SOLR
225.0000 mg | SUBCUTANEOUS | Status: DC
  Administered 2018-01-22: 225 mg via SUBCUTANEOUS

## 2018-01-23 NOTE — Progress Notes (Signed)
Documentation of medication administration and charges of Xolair have been completed by Timm Bonenberger, CMA based on the Xolair documentation sheet completed by Tammy Scott.  

## 2018-01-25 ENCOUNTER — Other Ambulatory Visit: Payer: Self-pay | Admitting: Pulmonary Disease

## 2018-02-02 DIAGNOSIS — M4802 Spinal stenosis, cervical region: Secondary | ICD-10-CM | POA: Diagnosis not present

## 2018-02-02 DIAGNOSIS — M50122 Cervical disc disorder at C5-C6 level with radiculopathy: Secondary | ICD-10-CM | POA: Diagnosis not present

## 2018-02-05 ENCOUNTER — Telehealth: Payer: Self-pay | Admitting: *Deleted

## 2018-02-05 ENCOUNTER — Telehealth: Payer: Self-pay | Admitting: Pulmonary Disease

## 2018-02-05 MED ORDER — ESTROGENS, CONJUGATED 0.625 MG/GM VA CREA: TOPICAL_CREAM | VAGINAL | 2 refills | Status: DC

## 2018-02-05 NOTE — Telephone Encounter (Signed)
Agree with Premarin cream 1/4 of an applicator twice a week.  Treat as long as patient has symptoms of dryness/discomfort or to help sexual activity.

## 2018-02-05 NOTE — Telephone Encounter (Signed)
Rx sent pt aware 

## 2018-02-05 NOTE — Telephone Encounter (Signed)
Patient called back to follow up from encounter on 01/17/18 tried the estradiol vaginal cream 0.1 mg and states she had vaginal irritation/burning x 3 days. She asked if I could sent in Rx for premarin as noted on same encounter. How long should she give the cream to help with vaginal atrophy? Please advise

## 2018-02-06 NOTE — Telephone Encounter (Signed)
#   vials: 2 Ordered date:02/06/18 Shipping Date:02/12/18

## 2018-02-08 DIAGNOSIS — J4550 Severe persistent asthma, uncomplicated: Secondary | ICD-10-CM | POA: Diagnosis not present

## 2018-02-13 NOTE — Telephone Encounter (Signed)
Prefilled Syringes: # 150mg  2  #75mg  0 Arrival Date:02/13/18 Lot #: 150mg  [id number]      75mg  0 Exp Date: 150mg  03/2021   75mg  0

## 2018-02-15 ENCOUNTER — Ambulatory Visit (INDEPENDENT_AMBULATORY_CARE_PROVIDER_SITE_OTHER): Payer: 59

## 2018-02-15 DIAGNOSIS — J4540 Moderate persistent asthma, uncomplicated: Secondary | ICD-10-CM | POA: Diagnosis not present

## 2018-02-16 MED ORDER — OMALIZUMAB 150 MG ~~LOC~~ SOLR
225.0000 mg | SUBCUTANEOUS | Status: DC
  Administered 2018-02-15: 225 mg via SUBCUTANEOUS

## 2018-02-16 NOTE — Progress Notes (Signed)
Documentation of medication administration and charges of Xolair have been completed by Lindsay Lemons, CMA based on the hand written Xolair documentation sheet completed by Tammy Scott, who administered the medication.  

## 2018-03-07 ENCOUNTER — Telehealth: Payer: Self-pay | Admitting: Pulmonary Disease

## 2018-03-07 NOTE — Telephone Encounter (Signed)
I faxed the info this morning, we had a meeting yesterday I had to get to. I told the rep I spoke to I would take care of this 1st thing this morning. The 1st time it failed, 2nd it went thru but when I returned [facility] call Janett Billow gave me a different fax # [phone]. Faxed it again to what I hope is the correct # and put Attn: Hannah(head of that dept.) as instructed by Janett Billow.

## 2018-03-07 NOTE — Telephone Encounter (Signed)
Tammy Scott please make sure to get this sent today. Thanks.

## 2018-03-08 ENCOUNTER — Ambulatory Visit: Payer: 59

## 2018-03-09 NOTE — Telephone Encounter (Signed)
Called [facility] to see if I could order her med.Marland Kitchen Her P/A is being processed.  The rep I spoke to today said he would send an e-mail to that team to expedite it. Pt missed her 03/08/18 inj.Marland Kitchen

## 2018-03-12 ENCOUNTER — Telehealth: Payer: Self-pay | Admitting: Pulmonary Disease

## 2018-03-12 DIAGNOSIS — J4550 Severe persistent asthma, uncomplicated: Secondary | ICD-10-CM | POA: Diagnosis not present

## 2018-03-12 NOTE — Telephone Encounter (Addendum)
Closed to early, I'll go ahead and complete this note and create another one. Please refer to note 03/12/18 for updates.

## 2018-03-12 NOTE — Telephone Encounter (Signed)
Called 03/09/18 to see if I could order pt's Xolair. Rep said it was still processing. I asked her if they could expedite the process. She said she could e-mail the team asking them to put a rush on her P/A. I'll call [facility] back today to see if pt was approved. I have been on hold for 18 mins and counting. Talked to one rep he had to transfer me to office del.. They were able to finish up pt's P/A since she missed her shot on 03/08/18. [facility] was able to set up del. For 03/13/18. # vials:2 Ordered date:03/12/18 Shipping Date:03/12/18

## 2018-03-13 NOTE — Telephone Encounter (Signed)
#   Vials:2 Arrival Date:03/13/18 Lot #:[id number] Exp Date:03/2021

## 2018-03-13 NOTE — Telephone Encounter (Signed)
Pt is calling back about Xolair inj [phone]

## 2018-03-14 ENCOUNTER — Ambulatory Visit (INDEPENDENT_AMBULATORY_CARE_PROVIDER_SITE_OTHER): Payer: 59

## 2018-03-14 DIAGNOSIS — J4540 Moderate persistent asthma, uncomplicated: Secondary | ICD-10-CM | POA: Diagnosis not present

## 2018-03-14 DIAGNOSIS — M4802 Spinal stenosis, cervical region: Secondary | ICD-10-CM | POA: Diagnosis not present

## 2018-03-14 DIAGNOSIS — M542 Cervicalgia: Secondary | ICD-10-CM | POA: Diagnosis not present

## 2018-03-14 DIAGNOSIS — R29898 Other symptoms and signs involving the musculoskeletal system: Secondary | ICD-10-CM | POA: Diagnosis not present

## 2018-03-14 MED ORDER — OMALIZUMAB 150 MG ~~LOC~~ SOLR
225.0000 mg | Freq: Once | SUBCUTANEOUS | Status: AC
  Administered 2018-03-14: 225 mg via SUBCUTANEOUS

## 2018-03-21 DIAGNOSIS — M542 Cervicalgia: Secondary | ICD-10-CM | POA: Diagnosis not present

## 2018-03-21 DIAGNOSIS — M4802 Spinal stenosis, cervical region: Secondary | ICD-10-CM | POA: Diagnosis not present

## 2018-03-21 DIAGNOSIS — R29898 Other symptoms and signs involving the musculoskeletal system: Secondary | ICD-10-CM | POA: Diagnosis not present

## 2018-03-22 DIAGNOSIS — Z87892 Personal history of anaphylaxis: Secondary | ICD-10-CM | POA: Diagnosis not present

## 2018-03-22 DIAGNOSIS — J4530 Mild persistent asthma, uncomplicated: Secondary | ICD-10-CM | POA: Diagnosis not present

## 2018-03-22 DIAGNOSIS — J3089 Other allergic rhinitis: Secondary | ICD-10-CM | POA: Diagnosis not present

## 2018-03-26 DIAGNOSIS — Z8371 Family history of colonic polyps: Secondary | ICD-10-CM | POA: Diagnosis not present

## 2018-03-26 DIAGNOSIS — Z86010 Personal history of colonic polyps: Secondary | ICD-10-CM | POA: Diagnosis not present

## 2018-03-26 DIAGNOSIS — D122 Benign neoplasm of ascending colon: Secondary | ICD-10-CM | POA: Diagnosis not present

## 2018-03-30 DIAGNOSIS — M4802 Spinal stenosis, cervical region: Secondary | ICD-10-CM | POA: Diagnosis not present

## 2018-03-30 DIAGNOSIS — R29898 Other symptoms and signs involving the musculoskeletal system: Secondary | ICD-10-CM | POA: Diagnosis not present

## 2018-03-30 DIAGNOSIS — M542 Cervicalgia: Secondary | ICD-10-CM | POA: Diagnosis not present

## 2018-04-02 ENCOUNTER — Telehealth: Payer: Self-pay | Admitting: *Deleted

## 2018-04-02 DIAGNOSIS — R29898 Other symptoms and signs involving the musculoskeletal system: Secondary | ICD-10-CM | POA: Diagnosis not present

## 2018-04-02 DIAGNOSIS — M542 Cervicalgia: Secondary | ICD-10-CM | POA: Diagnosis not present

## 2018-04-02 DIAGNOSIS — M4802 Spinal stenosis, cervical region: Secondary | ICD-10-CM | POA: Diagnosis not present

## 2018-04-02 DIAGNOSIS — J4550 Severe persistent asthma, uncomplicated: Secondary | ICD-10-CM | POA: Diagnosis not present

## 2018-04-02 NOTE — Telephone Encounter (Signed)
#   vials:2 Ordered date:04/02/18 Shipping Date:04/02/18

## 2018-04-03 NOTE — Telephone Encounter (Signed)
#   Vials:2 Arrival Date:04/03/18 Lot #:[id number] Exp Date:03/2021

## 2018-04-04 ENCOUNTER — Ambulatory Visit: Payer: 59

## 2018-04-04 ENCOUNTER — Ambulatory Visit (INDEPENDENT_AMBULATORY_CARE_PROVIDER_SITE_OTHER): Payer: 59

## 2018-04-04 DIAGNOSIS — J4540 Moderate persistent asthma, uncomplicated: Secondary | ICD-10-CM | POA: Diagnosis not present

## 2018-04-04 MED ORDER — OMALIZUMAB 150 MG ~~LOC~~ SOLR
225.0000 mg | Freq: Once | SUBCUTANEOUS | Status: AC
  Administered 2018-04-04: 225 mg via SUBCUTANEOUS

## 2018-04-05 DIAGNOSIS — M4802 Spinal stenosis, cervical region: Secondary | ICD-10-CM | POA: Diagnosis not present

## 2018-04-05 DIAGNOSIS — R29898 Other symptoms and signs involving the musculoskeletal system: Secondary | ICD-10-CM | POA: Diagnosis not present

## 2018-04-05 DIAGNOSIS — M542 Cervicalgia: Secondary | ICD-10-CM | POA: Diagnosis not present

## 2018-04-10 DIAGNOSIS — R29898 Other symptoms and signs involving the musculoskeletal system: Secondary | ICD-10-CM | POA: Diagnosis not present

## 2018-04-10 DIAGNOSIS — M542 Cervicalgia: Secondary | ICD-10-CM | POA: Diagnosis not present

## 2018-04-10 DIAGNOSIS — M4802 Spinal stenosis, cervical region: Secondary | ICD-10-CM | POA: Diagnosis not present

## 2018-04-12 DIAGNOSIS — R29898 Other symptoms and signs involving the musculoskeletal system: Secondary | ICD-10-CM | POA: Diagnosis not present

## 2018-04-12 DIAGNOSIS — M542 Cervicalgia: Secondary | ICD-10-CM | POA: Diagnosis not present

## 2018-04-12 DIAGNOSIS — M4802 Spinal stenosis, cervical region: Secondary | ICD-10-CM | POA: Diagnosis not present

## 2018-04-18 DIAGNOSIS — G8929 Other chronic pain: Secondary | ICD-10-CM | POA: Diagnosis not present

## 2018-04-18 DIAGNOSIS — M4316 Spondylolisthesis, lumbar region: Secondary | ICD-10-CM | POA: Diagnosis not present

## 2018-04-18 DIAGNOSIS — M545 Low back pain: Secondary | ICD-10-CM | POA: Diagnosis not present

## 2018-04-19 ENCOUNTER — Telehealth: Payer: Self-pay | Admitting: Pulmonary Disease

## 2018-04-19 DIAGNOSIS — R29898 Other symptoms and signs involving the musculoskeletal system: Secondary | ICD-10-CM | POA: Diagnosis not present

## 2018-04-19 DIAGNOSIS — M4802 Spinal stenosis, cervical region: Secondary | ICD-10-CM | POA: Diagnosis not present

## 2018-04-19 DIAGNOSIS — J4550 Severe persistent asthma, uncomplicated: Secondary | ICD-10-CM | POA: Diagnosis not present

## 2018-04-19 DIAGNOSIS — M542 Cervicalgia: Secondary | ICD-10-CM | POA: Diagnosis not present

## 2018-04-19 NOTE — Telephone Encounter (Signed)
#   vials:2 Ordered date:04/19/18 Shipping Date:03/2818

## 2018-04-22 DIAGNOSIS — Z23 Encounter for immunization: Secondary | ICD-10-CM | POA: Diagnosis not present

## 2018-04-24 DIAGNOSIS — M542 Cervicalgia: Secondary | ICD-10-CM | POA: Diagnosis not present

## 2018-04-24 DIAGNOSIS — M545 Low back pain: Secondary | ICD-10-CM | POA: Diagnosis not present

## 2018-04-24 DIAGNOSIS — M4316 Spondylolisthesis, lumbar region: Secondary | ICD-10-CM | POA: Diagnosis not present

## 2018-04-24 NOTE — Telephone Encounter (Signed)
#   Vials:2 Arrival Date:04/24/18 Lot #:[id number] Exp Date:03/2021

## 2018-04-25 ENCOUNTER — Ambulatory Visit (INDEPENDENT_AMBULATORY_CARE_PROVIDER_SITE_OTHER): Payer: 59

## 2018-04-25 DIAGNOSIS — J4540 Moderate persistent asthma, uncomplicated: Secondary | ICD-10-CM | POA: Diagnosis not present

## 2018-04-26 DIAGNOSIS — J4540 Moderate persistent asthma, uncomplicated: Secondary | ICD-10-CM | POA: Diagnosis not present

## 2018-04-26 DIAGNOSIS — M542 Cervicalgia: Secondary | ICD-10-CM | POA: Diagnosis not present

## 2018-04-26 DIAGNOSIS — M4316 Spondylolisthesis, lumbar region: Secondary | ICD-10-CM | POA: Diagnosis not present

## 2018-04-26 DIAGNOSIS — M545 Low back pain: Secondary | ICD-10-CM | POA: Diagnosis not present

## 2018-04-26 MED ORDER — OMALIZUMAB 150 MG ~~LOC~~ SOLR
225.0000 mg | Freq: Once | SUBCUTANEOUS | Status: AC
  Administered 2018-04-26: 225 mg via SUBCUTANEOUS

## 2018-04-26 NOTE — Progress Notes (Signed)
Documented by Kostas Marrow CMA based on hand-written Xolair documentation sheet completed by Tammy Scott CMA, who administered the medication.  

## 2018-04-30 DIAGNOSIS — M545 Low back pain: Secondary | ICD-10-CM | POA: Diagnosis not present

## 2018-04-30 DIAGNOSIS — M4802 Spinal stenosis, cervical region: Secondary | ICD-10-CM | POA: Diagnosis not present

## 2018-04-30 DIAGNOSIS — M542 Cervicalgia: Secondary | ICD-10-CM | POA: Diagnosis not present

## 2018-04-30 DIAGNOSIS — M4316 Spondylolisthesis, lumbar region: Secondary | ICD-10-CM | POA: Diagnosis not present

## 2018-05-02 ENCOUNTER — Telehealth: Payer: Self-pay | Admitting: Pulmonary Disease

## 2018-05-02 DIAGNOSIS — G8929 Other chronic pain: Secondary | ICD-10-CM | POA: Diagnosis not present

## 2018-05-02 DIAGNOSIS — M545 Low back pain: Secondary | ICD-10-CM | POA: Diagnosis not present

## 2018-05-02 NOTE — Telephone Encounter (Signed)
It's too soon to order, rep at [facility] asked me to call back 11/11.19.

## 2018-05-07 DIAGNOSIS — M545 Low back pain: Secondary | ICD-10-CM | POA: Diagnosis not present

## 2018-05-07 DIAGNOSIS — G8929 Other chronic pain: Secondary | ICD-10-CM | POA: Diagnosis not present

## 2018-05-08 NOTE — Telephone Encounter (Signed)
Called [facility] back, they failed to mention pt needed a P/A. So I initiated a verbal P/A. Xolair was refused so now I have to call and speak to a Pharmacist and go from there.

## 2018-05-10 DIAGNOSIS — M545 Low back pain: Secondary | ICD-10-CM | POA: Diagnosis not present

## 2018-05-10 DIAGNOSIS — M542 Cervicalgia: Secondary | ICD-10-CM | POA: Diagnosis not present

## 2018-05-10 DIAGNOSIS — M4316 Spondylolisthesis, lumbar region: Secondary | ICD-10-CM | POA: Diagnosis not present

## 2018-05-14 DIAGNOSIS — M545 Low back pain: Secondary | ICD-10-CM | POA: Diagnosis not present

## 2018-05-14 DIAGNOSIS — G8929 Other chronic pain: Secondary | ICD-10-CM | POA: Diagnosis not present

## 2018-05-15 NOTE — Telephone Encounter (Signed)
Called [facility] said Xolair has to go thru medical, her ins plan has excluded it under pharmacy benefit. Trying to call [employer] to initiate P/A thru her medical benefit, I've been on hold for 29 minutes and counting. [employer] hung up after 36 mins.. I'll call back in the morning, hopefully I'll have better luck. Before they get bombarded with calls.

## 2018-05-16 ENCOUNTER — Ambulatory Visit: Payer: 59

## 2018-05-16 DIAGNOSIS — E7800 Pure hypercholesterolemia, unspecified: Secondary | ICD-10-CM | POA: Diagnosis not present

## 2018-05-16 DIAGNOSIS — K219 Gastro-esophageal reflux disease without esophagitis: Secondary | ICD-10-CM | POA: Diagnosis not present

## 2018-05-16 DIAGNOSIS — R7303 Prediabetes: Secondary | ICD-10-CM | POA: Diagnosis not present

## 2018-05-17 ENCOUNTER — Ambulatory Visit: Payer: 59

## 2018-05-17 ENCOUNTER — Telehealth: Payer: Self-pay | Admitting: Pulmonary Disease

## 2018-05-17 DIAGNOSIS — M4316 Spondylolisthesis, lumbar region: Secondary | ICD-10-CM | POA: Diagnosis not present

## 2018-05-17 DIAGNOSIS — M545 Low back pain: Secondary | ICD-10-CM | POA: Diagnosis not present

## 2018-05-17 DIAGNOSIS — M542 Cervicalgia: Secondary | ICD-10-CM | POA: Diagnosis not present

## 2018-05-17 NOTE — Telephone Encounter (Signed)
Calling in the morning did not help. I was on hold for over 20 to 30 mins again.  Called pt to let her know why we can't get her med and that I'll keep trying. Pt said she would call too, to see if she has better luck.  Pt will miss 05/18/18 inj.Kathryn Griffin keep trying, hoping someone will answer one of our calls.

## 2018-05-18 ENCOUNTER — Ambulatory Visit: Payer: 59

## 2018-05-18 NOTE — Telephone Encounter (Signed)
Called that #, rep said I need to Ins co. Again. Will do that 05/21/18.

## 2018-05-18 NOTE — Telephone Encounter (Signed)
Patient calling for Kathryn Griffin, [place] is [phone]

## 2018-05-18 NOTE — Telephone Encounter (Signed)
Tammy, please advise on this for pt. Thanks! 

## 2018-05-18 NOTE — Telephone Encounter (Signed)
Calling pt. Now. Pt called [place]. We need to do an appeal. Will send BQ note a/b this Mon.Marland Kitchen

## 2018-05-21 DIAGNOSIS — M545 Low back pain: Secondary | ICD-10-CM | POA: Diagnosis not present

## 2018-05-21 DIAGNOSIS — M4316 Spondylolisthesis, lumbar region: Secondary | ICD-10-CM | POA: Diagnosis not present

## 2018-05-21 DIAGNOSIS — M542 Cervicalgia: Secondary | ICD-10-CM | POA: Diagnosis not present

## 2018-05-22 NOTE — Telephone Encounter (Signed)
Tammy, please advise if this has been taken care of for pt and if we can close the open encounter. Thanks!

## 2018-05-23 NOTE — Telephone Encounter (Signed)
I've been calling but I can't get through.

## 2018-05-23 NOTE — Telephone Encounter (Signed)
Tammy please advise on this encounter, thank you.

## 2018-05-29 NOTE — Telephone Encounter (Addendum)
I was finally able to get thru. After over an hour I was able to set up del. For Xolair. # vials:2 Ordered date:05/29/18 Shipping Date:05/29/18 Shipping date is 05/30/18 not 05/29/18. We will receive med on 05/31/18.  Rep called pt., Pt didn't answer. So, I called the pt and asked her to call [facility] back. Pt said she was going to call as soon as she hung up with me. Will wait for shipment to come in.

## 2018-05-29 NOTE — Telephone Encounter (Signed)
Please refer to ph note 05/17/18 for updates.

## 2018-05-30 ENCOUNTER — Encounter: Payer: Self-pay | Admitting: Pulmonary Disease

## 2018-05-30 ENCOUNTER — Ambulatory Visit (INDEPENDENT_AMBULATORY_CARE_PROVIDER_SITE_OTHER): Payer: 59 | Admitting: Pulmonary Disease

## 2018-05-30 VITALS — BP 122/66 | HR 66 | Ht 67.5 in | Wt 158.0 lb

## 2018-05-30 DIAGNOSIS — M4316 Spondylolisthesis, lumbar region: Secondary | ICD-10-CM | POA: Diagnosis not present

## 2018-05-30 DIAGNOSIS — J4550 Severe persistent asthma, uncomplicated: Secondary | ICD-10-CM

## 2018-05-30 DIAGNOSIS — M545 Low back pain: Secondary | ICD-10-CM | POA: Diagnosis not present

## 2018-05-30 DIAGNOSIS — G8929 Other chronic pain: Secondary | ICD-10-CM | POA: Diagnosis not present

## 2018-05-30 DIAGNOSIS — J309 Allergic rhinitis, unspecified: Secondary | ICD-10-CM

## 2018-05-30 DIAGNOSIS — M542 Cervicalgia: Secondary | ICD-10-CM | POA: Diagnosis not present

## 2018-05-30 NOTE — Progress Notes (Signed)
Subjective:    Patient ID: Kathryn Griffin, female    DOB: [birth year], 62 y.o.   MRN: [id number]  Synopsis: 62 yo female developed cough and thick mucus produciton for the first time in her life in 2013. Produced casts with coughing on several occassions and responded to antibiotic and prednisone therapy.  Bronchoscopy in 04/2013 showed mucus plugging/cast LUL. Bronch cultures grew aspergillus species. Baseline GERD.  Methacholine challenge 01/2013 negative.  IgE low/normal 2014. Eosinophil elevated one time in early 2014.  She was treated with itraconazole for a month and did well by January 2015. We started Xolair in 08/2017  HPI Chief Complaint  Patient presents with  . Follow-up    pt states she is doing well, denies any current complaints.    [name] has been doing well since the last visit.  She has not had a problem with cough, mucus production, wheezing, or shortness of breath since the last visit.  She continues to take Xolair injections.  She has been approved for another year of therapy.  She continues to take Qvar regularly.  She started taking Allegra and Flonase recently because the lease came down but she has not experienced much in the way of sinus congestion.   Past Medical History:  Diagnosis Date  . Aspergillus fumigatus ([place]) 11/14  . Hx of gestational diabetes mellitus, not currently pregnant   . Osteopenia 07/04/2017   T score -2.1 FRAX 9.6% / 1.3%  . Prediabetes   . Reflux   . Vaginal atrophy   . Vitamin D deficiency      Review of Systems  Constitutional: Negative for chills, fatigue and fever.  HENT: Negative for postnasal drip, rhinorrhea and sinus pressure.   Respiratory: Positive for cough, shortness of breath and wheezing.   Cardiovascular: Negative for chest pain, palpitations and leg swelling.         Objective:   Physical Exam Vitals:   05/30/18 1512  BP: 122/66  Pulse: 66  SpO2: 98%  Weight: 158 lb (71.7 kg)  Height: 5' 7.5" (1.715 m)   RA  Gen: well appearing HENT: OP clear, TM's clear, neck supple PULM: CTA B, normal percussion CV: RRR, no mgr, trace edema GI: BS+, soft, nontender Derm: no cyanosis or rash Psyche: normal mood and affect   08/02/2013 CXR > normal   04/2013 bronch > large left upper lobe mucus plug/cast causing left upper lobe collapse 04/2013 bronch culture> aspergillus species 05/2013 IgE 156 05/2013 Apergillus precipitans > low positive 07/2013 sputum culture > negative 08/2013 sputum culture > aspergillus sp 01/2014 sputum culture > aspergillus sp  Labs: 09/2016 absolute eosinophil 200 cell/dL, IgE 227       Assessment & Plan:   Severe persistent asthma without complication  Allergic rhinitis, unspecified seasonality, unspecified trigger  Discussion: This has been a very stable interval for [facility].  Xolair has done wonders for her asthma and Aspergillus infection.  I think she could probably actually decrease the amount of maintenance medicine she takes for allergic rhinitis and still have good control.  Plan: Allergic rhinitis: I think you could stop either Allegra or Flonase  Severe persistent asthma with Aspergillus infection and recurrent exacerbations: Continue Xolair injections every 3 weeks Continue Qvar 2 puffs twice a day no matter how you feel Continue montelukast If you have increasing chest congestion mucus production fevers chills or shortness of breath please call me  We will see you back in 6 months or sooner if needed   Current  Outpatient Medications:  .  albuterol (PROVENTIL) (2.5 MG/3ML) 0.083% nebulizer solution, Take 3 mLs (2.5 mg total) by nebulization every 6 (six) hours as needed for wheezing or shortness of breath., Disp: 360 mL, Rfl: 11 .  albuterol (VENTOLIN HFA) 108 (90 Base) MCG/ACT inhaler, inhale 2 puffs by mouth every 4 hours if needed, Disp: 18 g, Rfl: 5 .  Calcium Citrate (CITRACAL PO), Take 1 tablet by mouth daily at 8 pm. , Disp: , Rfl:  .   Cholecalciferol (VITAMIN D) 2000 units CAPS, Take 1 capsule by mouth daily., Disp: , Rfl:  .  conjugated estrogens (PREMARIN) vaginal cream, Apply 1/4 of applicator twice weekly vaginally, Disp: 42.5 g, Rfl: 2 .  EPINEPHrine (EPIPEN 2-PAK) 0.3 mg/0.3 mL IJ SOAJ injection, Inject 0.3 mLs (0.3 mg total) into the muscle once., Disp: 1 Device, Rfl: 0 .  famotidine (PEPCID) 20 MG tablet, Take 20 mg by mouth as needed. , Disp: , Rfl:  .  fexofenadine (ALLEGRA) 180 MG tablet, Take 180 mg by mouth daily., Disp: , Rfl:  .  fluticasone (FLONASE) 50 MCG/ACT nasal spray, Place 2 sprays into both nostrils daily. , Disp: , Rfl:  .  guaiFENesin (MUCINEX) 600 MG 12 hr tablet, Take by mouth 2 (two) times daily., Disp: , Rfl:  .  meloxicam (MOBIC) 15 MG tablet, Take 1 tablet by mouth daily as needed. , Disp: , Rfl: 1 .  montelukast (SINGULAIR) 10 MG tablet, TAKE 1 TABLET(10 MG) BY MOUTH DAILY, Disp: 30 tablet, Rfl: 11 .  Multiple Vitamin (MULTIVITAMIN) capsule, Take 1 capsule by mouth daily.  , Disp: , Rfl:  .  omeprazole (PRILOSEC) 40 MG capsule, Take 40 mg by mouth daily., Disp: , Rfl:  .  QVAR REDIHALER 80 MCG/ACT inhaler, INHALE 2 PUFFS INTO THE LUNGS TWICE DAILY, Disp: 10.6 g, Rfl: 3 .  Respiratory Therapy Supplies (FLUTTER) DEVI, Use as directed, Disp: 1 each, Rfl: 0 .  sodium chloride HYPERTONIC 3 % nebulizer solution, Take by nebulization 2 (two) times daily., Disp: 750 mL, Rfl: 11 .  Spacer/Aero-Holding Chambers (AEROCHAMBER MV) inhaler, Use as instructed, Disp: 1 each, Rfl: 0

## 2018-05-30 NOTE — Patient Instructions (Signed)
Allergic rhinitis: I think you could stop either Allegra or Flonase  Severe persistent asthma with Aspergillus infection and recurrent exacerbations: Continue Xolair injections every 3 weeks Continue Qvar 2 puffs twice a day no matter how you feel Continue montelukast If you have increasing chest congestion mucus production fevers chills or shortness of breath please call me  We will see you back in 6 months or sooner if needed

## 2018-05-31 ENCOUNTER — Ambulatory Visit (INDEPENDENT_AMBULATORY_CARE_PROVIDER_SITE_OTHER): Payer: 59

## 2018-05-31 DIAGNOSIS — J4550 Severe persistent asthma, uncomplicated: Secondary | ICD-10-CM | POA: Diagnosis not present

## 2018-06-01 MED ORDER — OMALIZUMAB 150 MG ~~LOC~~ SOLR
225.0000 mg | Freq: Once | SUBCUTANEOUS | Status: AC
  Administered 2018-05-31: 225 mg via SUBCUTANEOUS

## 2018-06-01 NOTE — Telephone Encounter (Signed)
#   Vials:2 Arrival Date:06/01/18 Lot #:[id number] Exp Date:03/2021 Didn't get a chance to put del. In 05/31/18.

## 2018-06-03 DIAGNOSIS — H16001 Unspecified corneal ulcer, right eye: Secondary | ICD-10-CM | POA: Diagnosis not present

## 2018-06-04 DIAGNOSIS — H16011 Central corneal ulcer, right eye: Secondary | ICD-10-CM | POA: Diagnosis not present

## 2018-06-06 DIAGNOSIS — H16011 Central corneal ulcer, right eye: Secondary | ICD-10-CM | POA: Diagnosis not present

## 2018-06-13 DIAGNOSIS — D3132 Benign neoplasm of left choroid: Secondary | ICD-10-CM | POA: Diagnosis not present

## 2018-06-25 ENCOUNTER — Telehealth: Payer: Self-pay | Admitting: Pulmonary Disease

## 2018-06-25 ENCOUNTER — Ambulatory Visit (INDEPENDENT_AMBULATORY_CARE_PROVIDER_SITE_OTHER): Payer: 59

## 2018-06-25 DIAGNOSIS — J4550 Severe persistent asthma, uncomplicated: Secondary | ICD-10-CM | POA: Diagnosis not present

## 2018-06-25 MED ORDER — OMALIZUMAB 150 MG ~~LOC~~ SOLR
225.0000 mg | Freq: Once | SUBCUTANEOUS | Status: AC
  Administered 2018-06-25: 225 mg via SUBCUTANEOUS

## 2018-06-25 NOTE — Telephone Encounter (Signed)
Prefilled Syringe: #150mg  1  #75mg  1 Ordered Date: 06/25/18 Shipping Date: 06/25/18

## 2018-06-26 NOTE — Telephone Encounter (Signed)
#   Vials:2 Arrival Date: 06/26/18 Lot #:[id number] Exp Date:03/2021

## 2018-07-02 ENCOUNTER — Ambulatory Visit (INDEPENDENT_AMBULATORY_CARE_PROVIDER_SITE_OTHER): Payer: 59 | Admitting: Obstetrics & Gynecology

## 2018-07-02 ENCOUNTER — Encounter: Payer: Self-pay | Admitting: Obstetrics & Gynecology

## 2018-07-02 VITALS — BP 110/70 | Ht 67.0 in | Wt 162.0 lb

## 2018-07-02 DIAGNOSIS — Z780 Asymptomatic menopausal state: Secondary | ICD-10-CM | POA: Diagnosis not present

## 2018-07-02 DIAGNOSIS — Z01419 Encounter for gynecological examination (general) (routine) without abnormal findings: Secondary | ICD-10-CM | POA: Diagnosis not present

## 2018-07-02 DIAGNOSIS — M8589 Other specified disorders of bone density and structure, multiple sites: Secondary | ICD-10-CM

## 2018-07-02 DIAGNOSIS — N952 Postmenopausal atrophic vaginitis: Secondary | ICD-10-CM

## 2018-07-02 MED ORDER — ESTROGENS, CONJUGATED 0.625 MG/GM VA CREA: TOPICAL_CREAM | VAGINAL | 3 refills | Status: DC

## 2018-07-02 NOTE — Progress Notes (Signed)
Kathryn Griffin 1956/02/28 [id number]   History:    63 y.o. G20P2A2L2 Married  RP:  Established patient presenting for annual gyn exam   HPI: Menopause, well on no systemic HRT.  No PMB.  No pelvic pain.  Using Premarin for vaginal dryness.  Occasionally feeling irritated at the right lower vulva, not having the symptoms currently.  Rarely sexually active currently because of her back and hip issues.  Physical therapy helping her right hip.  Able to walk regularly.  Body mass index 25.37.  Breast normal.  Urine and bowel movements normal.  Health labs with family physician.  Past medical history,surgical history, family history and social history were all reviewed and documented in the EPIC chart.  Gynecologic History No LMP recorded. Patient is postmenopausal. Contraception: post menopausal status Last Pap: 03/2017. Results were: Negative/HPV HR neg Last mammogram: 11/2017. Results were: Negative Bone Density: 06/2017 Osteopenia.  Will repeat at 4 yrs. Colonoscopy: 2019  Obstetric History OB History  Gravida Para Term Preterm AB Living  4 2     2 2   SAB TAB Ectopic Multiple Live Births  2            # Outcome Date GA Lbr Len/2nd Weight Sex Delivery Anes PTL Lv  4 SAB           3 SAB           2 Para           1 Para              ROS: A ROS was performed and pertinent positives and negatives are included in the history.  GENERAL: No fevers or chills. HEENT: No change in vision, no earache, sore throat or sinus congestion. NECK: No pain or stiffness. CARDIOVASCULAR: No chest pain or pressure. No palpitations. PULMONARY: No shortness of breath, cough or wheeze. GASTROINTESTINAL: No abdominal pain, nausea, vomiting or diarrhea, melena or bright red blood per rectum. GENITOURINARY: No urinary frequency, urgency, hesitancy or dysuria. MUSCULOSKELETAL: No joint or muscle pain, no back pain, no recent trauma. DERMATOLOGIC: No rash, no itching, no lesions. ENDOCRINE: No polyuria,  polydipsia, no heat or cold intolerance. No recent change in weight. HEMATOLOGICAL: No anemia or easy bruising or bleeding. NEUROLOGIC: No headache, seizures, numbness, tingling or weakness. PSYCHIATRIC: No depression, no loss of interest in normal activity or change in sleep pattern.     Exam:   BP 110/70   Ht 5\' 7"  (1.702 m)   Wt 162 lb (73.5 kg)   BMI 25.37 kg/m   Body mass index is 25.37 kg/m.  General appearance : Well developed well nourished female. No acute distress HEENT: Eyes: no retinal hemorrhage or exudates,  Neck supple, trachea midline, no carotid bruits, no thyroidmegaly Lungs: Clear to auscultation, no rhonchi or wheezes, or rib retractions  Heart: Regular rate and rhythm, no murmurs or gallops Breast:Examined in sitting and supine position were symmetrical in appearance, no palpable masses or tenderness,  no skin retraction, no nipple inversion, no nipple discharge, no skin discoloration, no axillary or supraclavicular lymphadenopathy Abdomen: no palpable masses or tenderness, no rebound or guarding Extremities: no edema or skin discoloration or tenderness  Pelvic: Vulva: Normal             Vagina: No gross lesions or discharge  Cervix: No gross lesions or discharge  Uterus  AV, normal size, shape and consistency, non-tender and mobile  Adnexa  Without masses or tenderness  Anus: Normal  Assessment/Plan:  63 y.o. female for annual exam   1. Well female exam with routine gynecological exam Normal gynecologic exam and menopause.  Pap test negative with negative high-risk HPV in October 2018.  Breast normal.  Screening mammogram negative in June 2019.  Good body mass index at 25.37.  Continue with regular walking and healthy nutrition.  Health labs with family physician.  2. Postmenopausal Well on no systemic hormone replacement therapy.  No postmenopausal bleeding.  3. Postmenopausal atrophic vaginitis Well on Premarin cream one quarter of an applicator twice  a week.  No contraindication to continue.  Prescription resent to pharmacy.  No vulvar irritation today.  Patient will call to be evaluated if has a recurrence.  4. Osteopenia of multiple sites Last bone density in January 2019 showed osteopenia.  Patient taking vitamin D supplements.  Will check a vitamin D level next health labs with family physician.  Calcium intake of 1500 mg daily recommended.  Continue with weightbearing physical activities regularly.  We will repeat a bone density in January 2021.  Other orders - omalizumab (XOLAIR) 150 MG injection; Inject into the skin. Every three weeks - conjugated estrogens (PREMARIN) vaginal cream; Apply 1/4 of applicator twice weekly vaginally and small amount on the vulva as needed  Princess Bruins MD, 2:32 PM 07/02/2018

## 2018-07-02 NOTE — Patient Instructions (Signed)
1. Well female exam with routine gynecological exam Normal gynecologic exam and menopause.  Pap test negative with negative high-risk HPV in October 2018.  Breast normal.  Screening mammogram negative in June 2019.  Good body mass index at 25.37.  Continue with regular walking and healthy nutrition.  Health labs with family physician.  2. Postmenopausal Well on no systemic hormone replacement therapy.  No postmenopausal bleeding.  3. Postmenopausal atrophic vaginitis Well on Premarin cream one quarter of an applicator twice a week.  No contraindication to continue.  Prescription resent to pharmacy.  No vulvar irritation today.  Patient will call to be evaluated if has a recurrence.  4. Osteopenia of multiple sites Last bone density in January 2019 showed osteopenia.  Patient taking vitamin D supplements.  Will check a vitamin D level next health labs with family physician.  Calcium intake of 1500 mg daily recommended.  Continue with weightbearing physical activities regularly.  We will repeat a bone density in January 2021.  Other orders - omalizumab (XOLAIR) 150 MG injection; Inject into the skin. Every three weeks - conjugated estrogens (PREMARIN) vaginal cream; Apply 1/4 of applicator twice weekly vaginally and small amount on the vulva as needed  Kathryn Griffin, it was a pleasure seeing you today!

## 2018-07-16 ENCOUNTER — Ambulatory Visit (INDEPENDENT_AMBULATORY_CARE_PROVIDER_SITE_OTHER): Payer: 59

## 2018-07-16 DIAGNOSIS — J4550 Severe persistent asthma, uncomplicated: Secondary | ICD-10-CM | POA: Diagnosis not present

## 2018-07-18 MED ORDER — OMALIZUMAB 150 MG ~~LOC~~ SOLR
225.0000 mg | Freq: Once | SUBCUTANEOUS | Status: AC
  Administered 2018-07-18: 225 mg via SUBCUTANEOUS

## 2018-07-30 ENCOUNTER — Telehealth: Payer: Self-pay | Admitting: Pulmonary Disease

## 2018-08-06 ENCOUNTER — Ambulatory Visit (INDEPENDENT_AMBULATORY_CARE_PROVIDER_SITE_OTHER): Payer: 59

## 2018-08-06 ENCOUNTER — Telehealth: Payer: Self-pay | Admitting: Pulmonary Disease

## 2018-08-06 DIAGNOSIS — J4550 Severe persistent asthma, uncomplicated: Secondary | ICD-10-CM

## 2018-08-06 NOTE — Telephone Encounter (Signed)
Error

## 2018-08-06 NOTE — Telephone Encounter (Signed)
#   vials:2 Ordered date:07/30/2018 Shipping Date:08/02/2018  Pt here today and no mediation on hand; called [facility] to check on order.  Was told there is a specialty medicine on hold for patient's account.   Pharmacy is having to investigate the medical benefits and will contact our office back in 24-48 hours; will ask for [employer].

## 2018-08-14 MED ORDER — OMALIZUMAB 150 MG ~~LOC~~ SOLR
225.0000 mg | SUBCUTANEOUS | Status: DC
  Administered 2018-08-06: 225 mg via SUBCUTANEOUS

## 2018-08-14 NOTE — Progress Notes (Signed)
Xolair injection documentation and charges entered by Kenya Kook, RMA, based on injection sheet filled out by Tammy Scott during preparation and administration. This documentation process is due to office requirements.   

## 2018-08-15 NOTE — Telephone Encounter (Signed)
[facility] did not call back so I called them. Ins. Has been verified. I was able to set up del., they need pt's consent. Rep called pt a couple of times, busy. I'll call pt 08/16/2018 and give her [facility] #.

## 2018-08-16 NOTE — Telephone Encounter (Signed)
Called pt, to let her know I ordered her xolair. Rep called twice with busy signal. Rep couldn't leave a message.  So I called pt this morning to ask her to call [facility] and give her consent.  # vials:2 Ordered date:08/16/2018 Shipping Date:08/20/2018

## 2018-08-17 NOTE — Telephone Encounter (Signed)
A new script was faxed for pt. The rx had Xolair SDVs on it. I asked [facility] to change the rx to PFSs if it will not require another P/A. I'll wait for a response.

## 2018-08-22 NOTE — Telephone Encounter (Signed)
I couldn't get rx changed to PFS. [facility] is delivering [facility] 08/24/2018.  Prefilled Syringe: #150mg  2  #75mg  0 Ordered Date: 08/22/2018 Shipping Date: 08/23/2018

## 2018-08-23 DIAGNOSIS — J4550 Severe persistent asthma, uncomplicated: Secondary | ICD-10-CM | POA: Diagnosis not present

## 2018-08-24 ENCOUNTER — Other Ambulatory Visit: Payer: Self-pay | Admitting: Pulmonary Disease

## 2018-08-24 DIAGNOSIS — R059 Cough, unspecified: Secondary | ICD-10-CM

## 2018-08-24 DIAGNOSIS — R05 Cough: Secondary | ICD-10-CM

## 2018-08-24 DIAGNOSIS — J9819 Other pulmonary collapse: Secondary | ICD-10-CM

## 2018-08-24 NOTE — Telephone Encounter (Signed)
#   Vials:2 Arrival Date:08/24/2018 Lot #[id number] Exp Date:12/2021

## 2018-08-26 IMAGING — CT CT CHEST HIGH RESOLUTION W/O CM
2 of 6 series · 14 of 36 positions shown, 17 images · non-contrast
Comparison: Chest CT 02/25/2014.

CLINICAL DATA: 61-year-old female with history of persistent
shortness of breath and cough. History of Aspergillus infection.
Increasing shortness of breath with exertion for 1 week.

EXAM:
CT CHEST WITHOUT CONTRAST
TECHNIQUE: Multidetector CT imaging of the chest was performed following the
standard protocol without intravenous contrast. High resolution
imaging of the lungs, as well as inspiratory and expiratory imaging,
was performed.

[Series 2: high resolution · axial · 0.65mm/px · z∈[-340,-32]mm · 11 of 174 slices shown, 14 images]
[im 10/174  mediastinal]
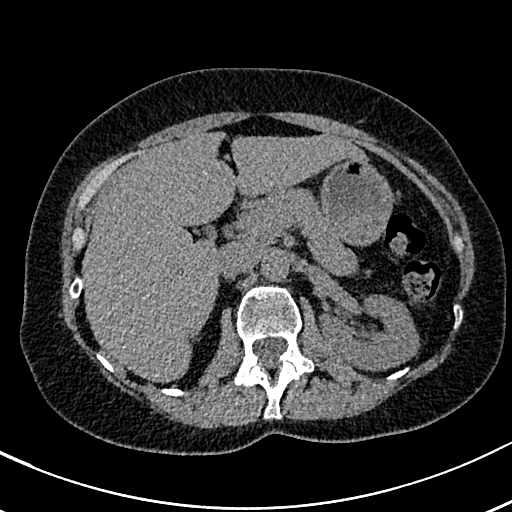
[im 10/174  lung]
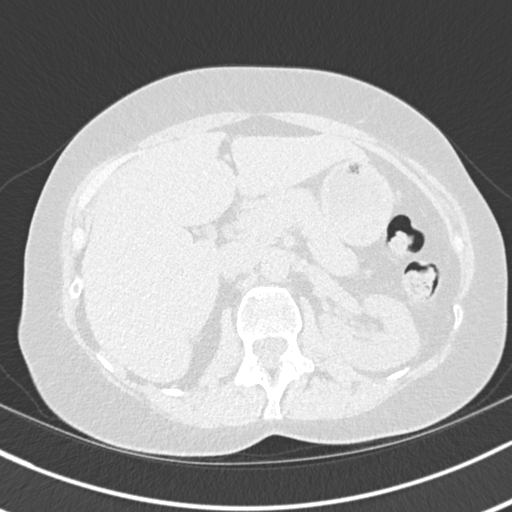
[im 28/174  lung]
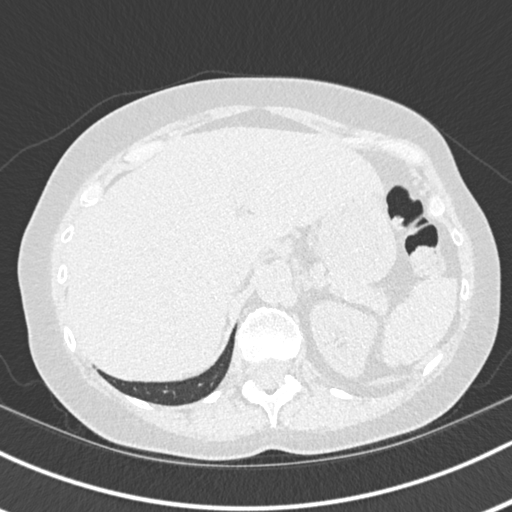
[im 46/174  lung]
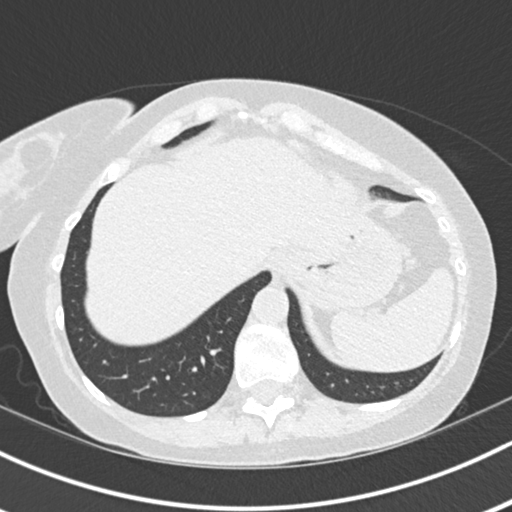
[im 55/174  lung]
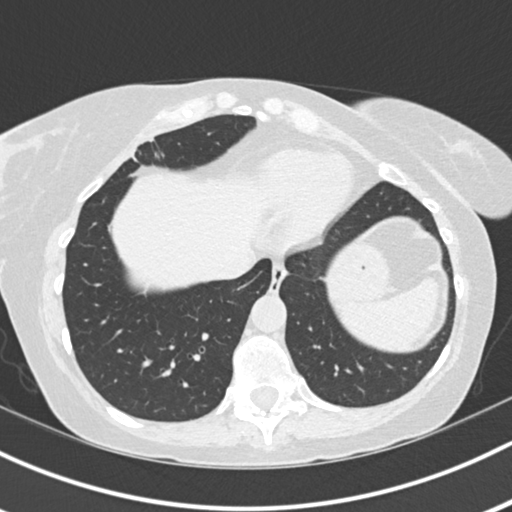
[im 73/174  mediastinal]
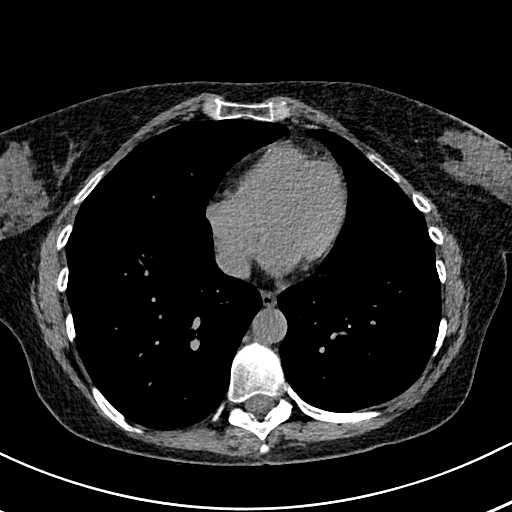
[im 73/174  lung]
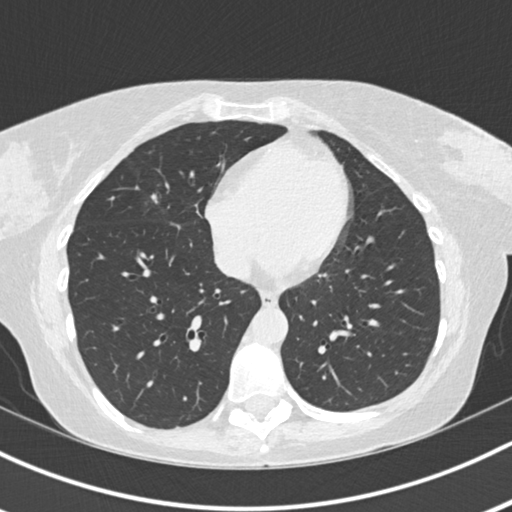
[im 92/174  lung]
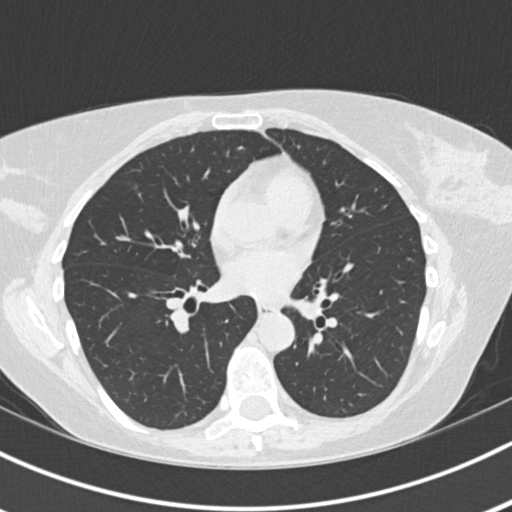
[im 101/174  lung]
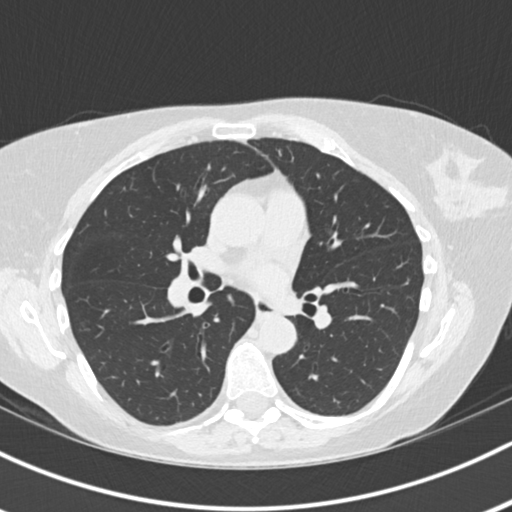
[im 119/174  lung]
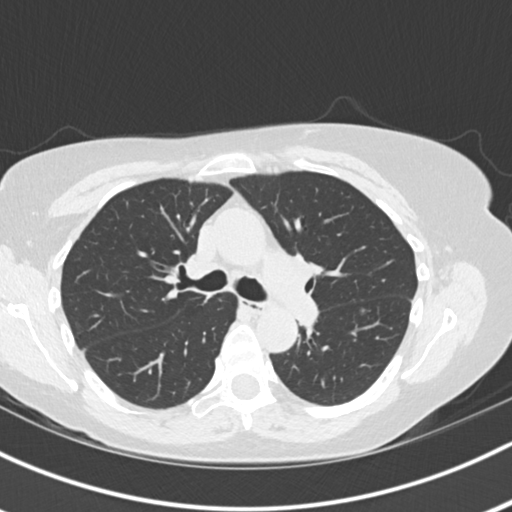
[im 128/174  mediastinal]
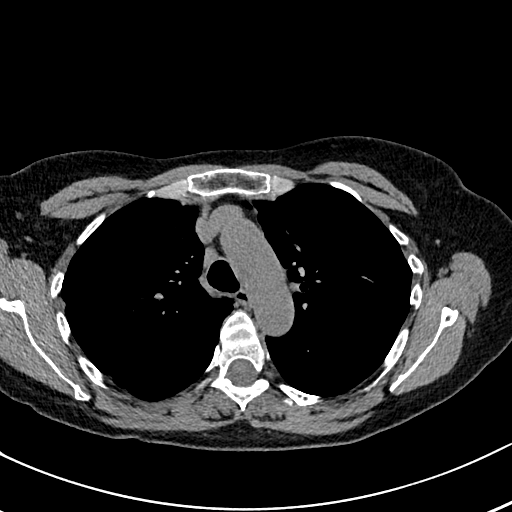
[im 128/174  lung]
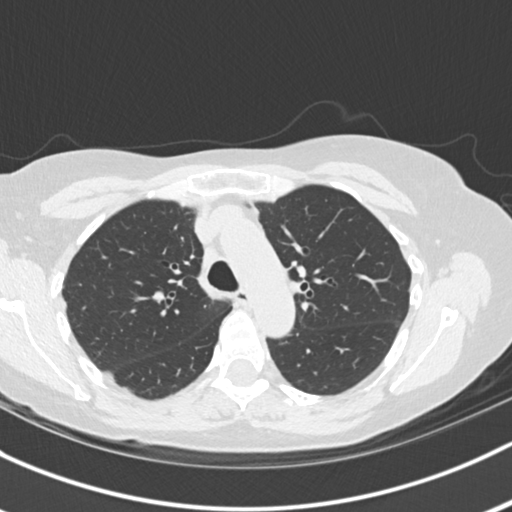
[im 146/174  lung]
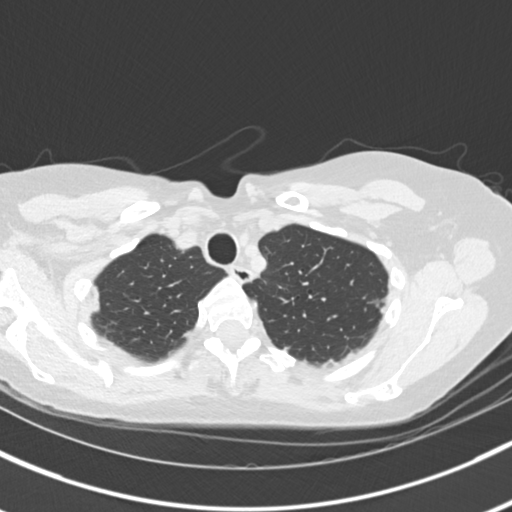
[im 164/174  lung]
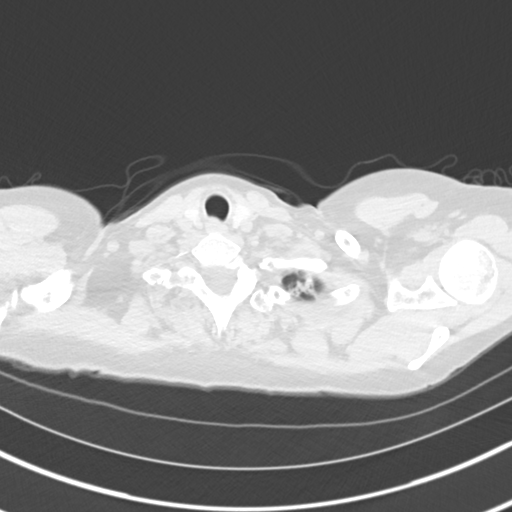

[Series 8: coronal · coronal · 0.62mm/px · 3 of 115 slices shown]
[im 23/115  lung]
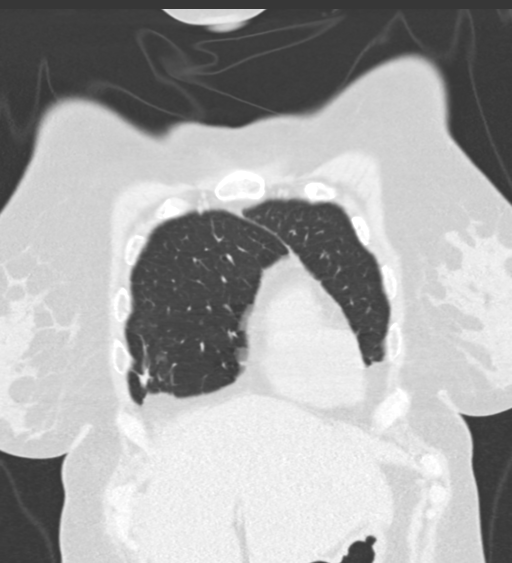
[im 46/115  lung]
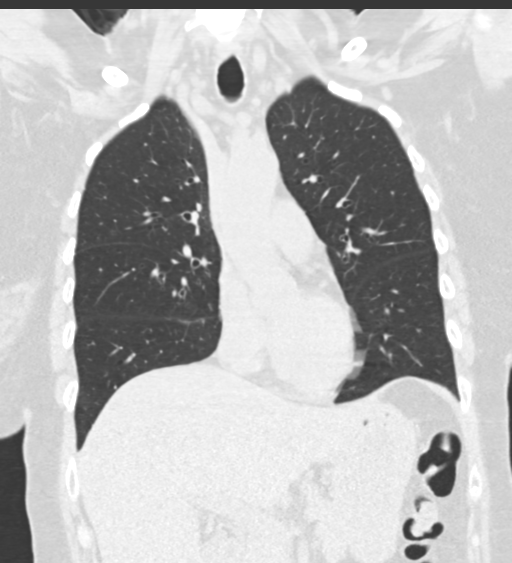
[im 69/115  lung]
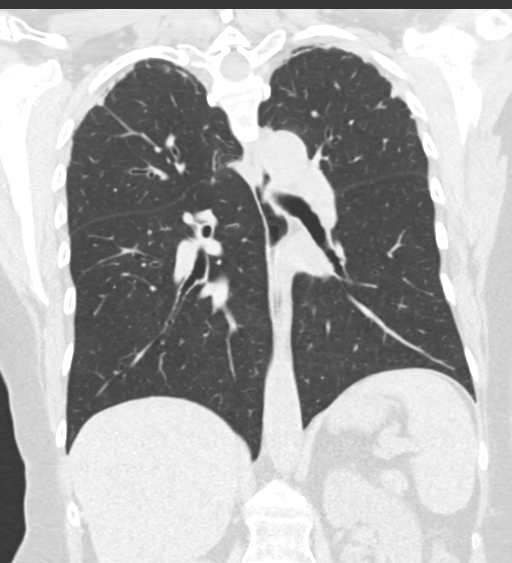

[14 of 36 positions shown; findings below may reference images not displayed]

FINDINGS: Cardiovascular: Heart size is normal. There is no significant
pericardial fluid, thickening or pericardial calcification. There is
aortic atherosclerosis, as well as atherosclerosis of the great
vessels of the mediastinum and the coronary arteries, including
calcified atherosclerotic plaque in the left anterior descending and
left circumflex coronary arteries.

Mediastinum/Nodes: No pathologically enlarged mediastinal or hilar
lymph nodes. Please note that accurate exclusion of hilar adenopathy
is limited on noncontrast CT scans. Esophagus is unremarkable in
appearance. No axillary lymphadenopathy.

Lungs/Pleura: Small area of architectural distortion and volume loss
in the lateral segment of the right middle lobe, most compatible
with chronic post infectious or inflammatory scarring.
High-resolution images otherwise demonstrate no significant regions
of ground-glass attenuation, septal thickening, subpleural
reticulation or honeycombing. A few areas of very mild cylindrical
bronchiectasis are noted, most evident in the right middle lobe.
Inspiratory and expiratory imaging demonstrates some mild air
trapping indicative of small airways disease. No acute consolidative
airspace disease. No pleural effusions. Mild bilateral apical
nodular pleuroparenchymal thickening, most compatible with chronic
post infectious or inflammatory scarring.

Upper Abdomen: Aortic atherosclerosis.

Musculoskeletal: There are no aggressive appearing lytic or blastic
lesions noted in the visualized portions of the skeleton.
IMPRESSION: 1. No definite evidence of interstitial lung disease.
2. Few areas of very mild cylindrical bronchiectasis are noted, most
evident in the right middle lobe where there is also some chronic
post infectious or inflammatory scarring.
3. Mild air trapping indicative of mild small airways disease.
4. Aortic atherosclerosis, in addition to 2 vessel coronary artery
disease. Please note that although the presence of coronary artery
calcium documents the presence of coronary artery disease, the
severity of this disease and any potential stenosis cannot be
assessed on this non-gated CT examination. Assessment for potential
risk factor modification, dietary therapy or pharmacologic therapy
may be warranted, if clinically indicated.

Aortic Atherosclerosis ([id number]).

## 2018-08-27 ENCOUNTER — Ambulatory Visit (INDEPENDENT_AMBULATORY_CARE_PROVIDER_SITE_OTHER): Payer: 59

## 2018-08-27 ENCOUNTER — Ambulatory Visit: Payer: 59

## 2018-08-27 DIAGNOSIS — J4550 Severe persistent asthma, uncomplicated: Secondary | ICD-10-CM

## 2018-08-27 MED ORDER — OMALIZUMAB 150 MG ~~LOC~~ SOLR
225.0000 mg | Freq: Once | SUBCUTANEOUS | Status: AC
  Administered 2018-08-27: 225 mg via SUBCUTANEOUS

## 2018-09-04 ENCOUNTER — Telehealth: Payer: Self-pay | Admitting: Pulmonary Disease

## 2018-09-06 NOTE — Telephone Encounter (Signed)
Routing to injection pool.  

## 2018-09-07 NOTE — Telephone Encounter (Signed)
Pt is calling back to sched apt. Cb is [phone]

## 2018-09-10 NOTE — Telephone Encounter (Signed)
Tammy please advise, thank you.  

## 2018-09-12 NOTE — Telephone Encounter (Signed)
Pt is calling in to check the status of scheduling her injection appt. CB# [phone]

## 2018-09-14 NOTE — Telephone Encounter (Signed)
Patient would like to schedule Xolair appt. On Tuesday 09/18/2018.  Patient phone number is [phone].

## 2018-09-17 DIAGNOSIS — J4550 Severe persistent asthma, uncomplicated: Secondary | ICD-10-CM | POA: Diagnosis not present

## 2018-09-17 NOTE — Telephone Encounter (Signed)
Called pt 09/14/2018, it was late so I didn't get a chance to document. Ordered pt's Xolair 09/14/2018, called pt to make her aware. We went ahead and scheduled her an appt. For 09/19/2018.  Prefilled Syringe: #150mg  2  #75mg  0 Ordered Date: 09/14/2018 Shipping Date: 09/17/2018

## 2018-09-18 ENCOUNTER — Telehealth: Payer: Self-pay | Admitting: Pulmonary Disease

## 2018-09-18 NOTE — Telephone Encounter (Signed)
Pt's Xolair came in. Put delivery in [place] text 09/18/2018. Since this note was closed by mistake.

## 2018-09-18 NOTE — Telephone Encounter (Signed)
#   Vials:2 Arrival Date:09/18/2018 Lot #[id number] Exp Date:02/2022

## 2018-09-19 ENCOUNTER — Ambulatory Visit (INDEPENDENT_AMBULATORY_CARE_PROVIDER_SITE_OTHER): Payer: 59

## 2018-09-19 ENCOUNTER — Other Ambulatory Visit: Payer: Self-pay

## 2018-09-19 DIAGNOSIS — J4550 Severe persistent asthma, uncomplicated: Secondary | ICD-10-CM | POA: Diagnosis not present

## 2018-09-19 MED ORDER — OMALIZUMAB 150 MG ~~LOC~~ SOLR
225.0000 mg | Freq: Once | SUBCUTANEOUS | Status: AC
  Administered 2018-09-19: 225 mg via SUBCUTANEOUS

## 2018-10-02 ENCOUNTER — Telehealth: Payer: Self-pay | Admitting: Pulmonary Disease

## 2018-10-02 NOTE — Telephone Encounter (Signed)
[facility] is still verifying pt's ins. Benefits, rep expedited process. She said to call back 10/03/2018 after lunch to see if I can order then.

## 2018-10-04 NOTE — Telephone Encounter (Signed)
Received a fax from [place] dated from 10/02/18. Fax stated that they have completed the benefits verification for the [place]. They have started the process of contacting the patient to review coverage and benefits as well as the consent to bill. They will attempt to contact the patient for 3 days and then fax the information to our office.

## 2018-10-04 NOTE — Telephone Encounter (Signed)
Pt called [facility] today to give them consent to ship. Pt gave her blanket consent for the yr last time her Xolair was ordered. It was recorded this time. Name of biologic: Xolair Strength: 225mg   Is this BUY AND BILL? No  Date of last injection: 09/19/2018  Primary pulmonologist: Simonne Maffucci MD Date of last appointment: 09/19/2018  Order placed today for patient's next dose of Xolair through [place] [phone].   The expected ship date is 10/08/2018.

## 2018-10-08 DIAGNOSIS — J4550 Severe persistent asthma, uncomplicated: Secondary | ICD-10-CM | POA: Diagnosis not present

## 2018-10-09 NOTE — Telephone Encounter (Signed)
Pt called back and said she could come later, not a problem. Med. Was supposed to be here 10/09/2018. I called [facility], they said the pkg had been delivered. I went up front, it was here, no one made me aware. Called pt to let her know she can keep her 9:15 appt tomorrow. Nothing further needed.

## 2018-10-09 NOTE — Telephone Encounter (Signed)
Called pt to see if she could reschedule her 9:15 appt. To later in the morning. Her Xolair is coming in 10/10/2018. There is no guarantee it will be here by 10:00 or 10:30. Will wait for pt tcb.Marland Kitchen

## 2018-10-09 NOTE — Telephone Encounter (Signed)
Xolair received on 10/09/2018. Patient's next scheduled biologic appointment: 10/10/2018

## 2018-10-10 ENCOUNTER — Other Ambulatory Visit: Payer: Self-pay

## 2018-10-10 ENCOUNTER — Ambulatory Visit (INDEPENDENT_AMBULATORY_CARE_PROVIDER_SITE_OTHER): Payer: 59

## 2018-10-10 DIAGNOSIS — J4550 Severe persistent asthma, uncomplicated: Secondary | ICD-10-CM | POA: Diagnosis not present

## 2018-10-10 MED ORDER — OMALIZUMAB 150 MG ~~LOC~~ SOLR
225.0000 mg | Freq: Once | SUBCUTANEOUS | Status: AC
  Administered 2018-10-10: 225 mg via SUBCUTANEOUS

## 2018-10-10 NOTE — Progress Notes (Signed)
Have you been hospitalized within the last 10 days?  No Do you have a fever?  No Do you have a cough?  No Do you have a headache or sore throat? No  

## 2018-10-11 ENCOUNTER — Encounter: Payer: Self-pay | Admitting: Pulmonary Disease

## 2018-10-23 ENCOUNTER — Telehealth: Payer: Self-pay | Admitting: Pulmonary Disease

## 2018-10-23 NOTE — Telephone Encounter (Signed)
Xolair Prefilled Syringe Order: 150mg  Prefilled Syringe:  #2 75mg  Prefilled Syringe: #2 Ordered Date: 10/23/2018 Expected shipping date: 10/25/2018 Ordered by: Desmond Dike, [place] Pharmacy: [facility]

## 2018-10-24 DIAGNOSIS — J4550 Severe persistent asthma, uncomplicated: Secondary | ICD-10-CM | POA: Diagnosis not present

## 2018-10-25 NOTE — Telephone Encounter (Signed)
Xolair Prefilled Syringe Received:  150mg  Prefilled Syringe >> quantity 2, lot # [id number], exp date 02/2022 Medication arrival date: 10/25/2018 Received by: TBS

## 2018-10-31 ENCOUNTER — Other Ambulatory Visit: Payer: Self-pay

## 2018-10-31 ENCOUNTER — Ambulatory Visit (INDEPENDENT_AMBULATORY_CARE_PROVIDER_SITE_OTHER): Payer: 59

## 2018-10-31 DIAGNOSIS — J4550 Severe persistent asthma, uncomplicated: Secondary | ICD-10-CM

## 2018-10-31 MED ORDER — OMALIZUMAB 150 MG ~~LOC~~ SOLR
225.0000 mg | Freq: Once | SUBCUTANEOUS | Status: AC
  Administered 2018-10-31: 225 mg via SUBCUTANEOUS

## 2018-10-31 NOTE — Patient Instructions (Signed)
Kathryn Griffin came in on 10/30/2018 to receive a Xolair injection. The patient is given 225mg  every 21 days. Due to each vial equalling 150mg , 75mg  of medication was wasted.

## 2018-10-31 NOTE — Progress Notes (Signed)
Have you been hospitalized within the last 10 days?  No Do you have a fever?  No Do you have a cough?  No Do you have a headache or sore throat? No  

## 2018-11-12 ENCOUNTER — Telehealth: Payer: Self-pay | Admitting: *Deleted

## 2018-11-12 NOTE — Telephone Encounter (Signed)
Error

## 2018-11-13 ENCOUNTER — Telehealth: Payer: Self-pay | Admitting: Pulmonary Disease

## 2018-11-13 NOTE — Telephone Encounter (Signed)
[place] to set up shipment. I was advised that the pharmacy needed to do insurance verification for this prescription. A follow up called will need to be made on 11/16/2018 to set up shipment.  Will route message to [facility] for follow up as I am not in the office on 11/16/2018.

## 2018-11-15 NOTE — Telephone Encounter (Signed)
I took a chance and called [facility] to see if I could order med today. Yes, ins had been verified. Xolair Order: # vials:2 Ordered date:11/15/2018 Expected date of arrival:11/16/2018 Ordered by: TBS Speciality Pharmacy: [facility]. Will wait for Xolair to come in.

## 2018-11-16 NOTE — Telephone Encounter (Signed)
Xolair Received: # Vials: 2 Medication arrival date: 11/16/2018 Lot #: [id number] Exp date: 02/2022 Received by: Laurette Schimke

## 2018-11-21 ENCOUNTER — Ambulatory Visit (INDEPENDENT_AMBULATORY_CARE_PROVIDER_SITE_OTHER): Payer: 59

## 2018-11-21 ENCOUNTER — Other Ambulatory Visit: Payer: Self-pay

## 2018-11-21 DIAGNOSIS — J4550 Severe persistent asthma, uncomplicated: Secondary | ICD-10-CM

## 2018-11-21 MED ORDER — OMALIZUMAB 150 MG ~~LOC~~ SOLR
225.0000 mg | Freq: Once | SUBCUTANEOUS | Status: AC
  Administered 2018-11-21: 225 mg via SUBCUTANEOUS

## 2018-11-21 NOTE — Progress Notes (Signed)
Have you been hospitalized within the last 10 days?  No Do you have a fever?  No Do you have a cough?  No Do you have a headache or sore throat? No  

## 2018-11-21 NOTE — Patient Instructions (Addendum)
Kathryn Griffin came in on 11/21/2018 to receive a Xolair injection. The patient is given 225mg  every 21 days. Due to each vial equalling 150mg , 75mg  of medication was wasted. (Changed med wasted from 25 to 75, the actual mg s that was wasted.)

## 2018-12-04 ENCOUNTER — Telehealth: Payer: Self-pay | Admitting: Pulmonary Disease

## 2018-12-04 ENCOUNTER — Other Ambulatory Visit: Payer: Self-pay | Admitting: Obstetrics & Gynecology

## 2018-12-04 DIAGNOSIS — Z1231 Encounter for screening mammogram for malignant neoplasm of breast: Secondary | ICD-10-CM

## 2018-12-04 NOTE — Telephone Encounter (Signed)
Xolair Order: # vials: 2 Ordered date: 12/04/2018 Expected date of arrival: 12/06/2018 Ordered by: Desmond Dike, [place]: [facility]

## 2018-12-07 NOTE — Telephone Encounter (Signed)
Xolair Received: # Vials: 2 Medication arrival date: 12/06/2018 Lot #: [id number] Exp date: 04/2022 Received by: Tammy Scott  Medication was not logged until today. Medication was placed in our sample medication refrigerator without being logged in when it arrived.

## 2018-12-12 ENCOUNTER — Ambulatory Visit (INDEPENDENT_AMBULATORY_CARE_PROVIDER_SITE_OTHER): Payer: 59

## 2018-12-12 ENCOUNTER — Other Ambulatory Visit: Payer: Self-pay

## 2018-12-12 DIAGNOSIS — J4550 Severe persistent asthma, uncomplicated: Secondary | ICD-10-CM | POA: Diagnosis not present

## 2018-12-12 MED ORDER — OMALIZUMAB 150 MG ~~LOC~~ SOLR
225.0000 mg | Freq: Once | SUBCUTANEOUS | Status: AC
  Administered 2018-12-12: 225 mg via SUBCUTANEOUS

## 2018-12-12 NOTE — Progress Notes (Signed)
Have you been hospitalized within the last 10 days?  No Do you have a fever?  No Do you have a cough?  No Do you have a headache or sore throat? No  

## 2018-12-12 NOTE — Patient Instructions (Addendum)
Correction:Waste charge not needed, pt is not [employer].

## 2018-12-17 ENCOUNTER — Other Ambulatory Visit: Payer: Self-pay | Admitting: Gastroenterology

## 2018-12-17 DIAGNOSIS — R1013 Epigastric pain: Secondary | ICD-10-CM

## 2018-12-20 ENCOUNTER — Ambulatory Visit
Admission: RE | Admit: 2018-12-20 | Discharge: 2018-12-20 | Disposition: A | Discharge status: Home / Self Care | Payer: 59 | Source: Ambulatory Visit | Attending: Gastroenterology | Admitting: Gastroenterology

## 2018-12-20 DIAGNOSIS — R1013 Epigastric pain: Secondary | ICD-10-CM

## 2018-12-31 ENCOUNTER — Telehealth: Payer: Self-pay | Admitting: Pulmonary Disease

## 2018-12-31 NOTE — Telephone Encounter (Signed)
Xolair Prefilled Syringe Order: 150mg  Prefilled Syringe:  #2 75mg  Prefilled Syringe: #2 Ordered Date: 12/31/2018 Expected date of arrival: 01/02/2019 Ordered by: Desmond Dike, [place]: Claria Dice

## 2019-01-02 ENCOUNTER — Telehealth: Payer: Self-pay

## 2019-01-02 NOTE — Telephone Encounter (Signed)
Patient called and asked if the [facility] testing site is doing Covid antibody testing, because she saw it on the website of [place] and [employer]. I advised antibody testing is not done there and that she will need to notify her PCP about the testing, she verbalized understanding.

## 2019-01-02 NOTE — Telephone Encounter (Signed)
Xolair Received: # Vials: 2 Medication arrival date: 01/02/2019 Lot #: [id number] Exp date: 04/2022 Received by: Laurette Schimke

## 2019-01-07 ENCOUNTER — Ambulatory Visit (INDEPENDENT_AMBULATORY_CARE_PROVIDER_SITE_OTHER): Payer: 59

## 2019-01-07 ENCOUNTER — Ambulatory Visit: Payer: 59

## 2019-01-07 ENCOUNTER — Other Ambulatory Visit: Payer: Self-pay

## 2019-01-07 ENCOUNTER — Telehealth: Payer: Self-pay | Admitting: Pulmonary Disease

## 2019-01-07 DIAGNOSIS — J4550 Severe persistent asthma, uncomplicated: Secondary | ICD-10-CM

## 2019-01-07 MED ORDER — OMALIZUMAB 150 MG ~~LOC~~ SOLR
225.0000 mg | SUBCUTANEOUS | Status: DC
  Administered 2019-01-07: 225 mg via SUBCUTANEOUS

## 2019-01-07 NOTE — Progress Notes (Signed)
Have you been hospitalized within the last 10 days?  No Do you have a fever?  No Do you have a cough?  No Do you have a headache or sore throat? No  

## 2019-01-08 NOTE — Telephone Encounter (Signed)
Pt's Dr that is treating her for the fungal infection said it wouldn't interfere with xolair shot. Nothing further needed.

## 2019-01-16 ENCOUNTER — Other Ambulatory Visit: Payer: Self-pay

## 2019-01-16 ENCOUNTER — Ambulatory Visit
Admission: RE | Admit: 2019-01-16 | Discharge: 2019-01-16 | Disposition: A | Discharge status: Home / Self Care | Payer: 59 | Source: Ambulatory Visit | Attending: Obstetrics & Gynecology | Admitting: Obstetrics & Gynecology

## 2019-01-16 DIAGNOSIS — Z1231 Encounter for screening mammogram for malignant neoplasm of breast: Secondary | ICD-10-CM

## 2019-01-18 ENCOUNTER — Telehealth: Payer: Self-pay | Admitting: Pulmonary Disease

## 2019-01-18 NOTE — Telephone Encounter (Signed)
Attempt to call [facility] for delivery at [phone].  First time was placed on hold and disconnected.  Called back and someone picked up the line but could only hear coughing and no one would speak when asking if they could hear me.

## 2019-01-21 NOTE — Telephone Encounter (Signed)
Returned call to schedule delivery of xolair.  Confirmed delivery date for 01/23/19 to [facility] office location. [place] further needed.

## 2019-01-24 NOTE — Telephone Encounter (Signed)
Xolair Received: # Vials: 2 Medication arrival date: 01/24/19  Lot #: [id number] Exp date: 07/28/2022 Received by: Parke Poisson, CMA

## 2019-01-28 ENCOUNTER — Ambulatory Visit (INDEPENDENT_AMBULATORY_CARE_PROVIDER_SITE_OTHER): Payer: 59

## 2019-01-28 ENCOUNTER — Other Ambulatory Visit: Payer: Self-pay

## 2019-01-28 DIAGNOSIS — J4550 Severe persistent asthma, uncomplicated: Secondary | ICD-10-CM

## 2019-01-28 MED ORDER — OMALIZUMAB 150 MG ~~LOC~~ SOLR
225.0000 mg | Freq: Once | SUBCUTANEOUS | Status: AC
  Administered 2019-01-28: 225 mg via SUBCUTANEOUS

## 2019-01-28 NOTE — Progress Notes (Signed)
Have you been hospitalized within the last 10 days?  No Do you have a fever?  No Do you have a cough?  No Do you have a headache or sore throat? No  

## 2019-02-11 ENCOUNTER — Telehealth: Payer: Self-pay | Admitting: Pulmonary Disease

## 2019-02-11 NOTE — Telephone Encounter (Signed)
Xolair Order: # vials: 2 Ordered date: 02/11/19 Expected date of arrival: 02/13/19 Ordered by: Tonna Corner Speciality Pharmacy: [facility]

## 2019-02-13 NOTE — Telephone Encounter (Signed)
Xolair Received: # Vials: 2 Medication arrival date: 02/13/19 Lot #: [id number] Exp date: 07/28/2022 Received by: Tonna Corner

## 2019-02-18 ENCOUNTER — Other Ambulatory Visit: Payer: Self-pay

## 2019-02-18 ENCOUNTER — Ambulatory Visit (INDEPENDENT_AMBULATORY_CARE_PROVIDER_SITE_OTHER): Payer: 59

## 2019-02-18 DIAGNOSIS — J4550 Severe persistent asthma, uncomplicated: Secondary | ICD-10-CM

## 2019-02-18 MED ORDER — OMALIZUMAB 150 MG ~~LOC~~ SOLR
225.0000 mg | SUBCUTANEOUS | Status: DC
  Administered 2019-02-18: 225 mg via SUBCUTANEOUS

## 2019-02-18 NOTE — Progress Notes (Signed)
All questions were answered by the patient before medication was administered. Have you been hospitalized in the last 10 days? No Do you have a fever? No Do you have a cough? No Do you have a headache or sore throat? No  

## 2019-03-05 ENCOUNTER — Telehealth: Payer: Self-pay | Admitting: Pulmonary Disease

## 2019-03-05 NOTE — Telephone Encounter (Signed)
Xolair Order: # vials: 4 Ordered date: 03/05/2019 Expected date of arrival: 03/08/2019 Ordered by: Desmond Dike, [place]  Specialty Pharmacy: [facility]

## 2019-03-08 NOTE — Telephone Encounter (Signed)
Xolair Received: # Vials: 2 Medication arrival date: 03/08/19 Lot #: [id number] Exp date: 07/28/2022 Received by: Leander Rams, LPN

## 2019-03-11 ENCOUNTER — Ambulatory Visit (INDEPENDENT_AMBULATORY_CARE_PROVIDER_SITE_OTHER): Payer: 59

## 2019-03-11 ENCOUNTER — Other Ambulatory Visit: Payer: Self-pay

## 2019-03-11 DIAGNOSIS — J4550 Severe persistent asthma, uncomplicated: Secondary | ICD-10-CM | POA: Diagnosis not present

## 2019-03-11 MED ORDER — OMALIZUMAB 150 MG/ML ~~LOC~~ SOSY: 300.0000 mg | PREFILLED_SYRINGE | Freq: Once | SUBCUTANEOUS | Status: DC

## 2019-03-11 MED ORDER — OMALIZUMAB 150 MG ~~LOC~~ SOLR
300.0000 mg | SUBCUTANEOUS | Status: DC
  Administered 2019-03-11: 300 mg via SUBCUTANEOUS

## 2019-03-11 NOTE — Progress Notes (Signed)
All questions were answered by the patient before medication was administered. Have you been hospitalized in the last 10 days? No Do you have a fever? No Do you have a cough? No Do you have a headache or sore throat? No  

## 2019-03-12 ENCOUNTER — Ambulatory Visit (INDEPENDENT_AMBULATORY_CARE_PROVIDER_SITE_OTHER): Payer: 59 | Admitting: Adult Health

## 2019-03-12 ENCOUNTER — Encounter: Payer: Self-pay | Admitting: Adult Health

## 2019-03-12 DIAGNOSIS — J309 Allergic rhinitis, unspecified: Secondary | ICD-10-CM

## 2019-03-12 DIAGNOSIS — J4550 Severe persistent asthma, uncomplicated: Secondary | ICD-10-CM

## 2019-03-12 DIAGNOSIS — Z23 Encounter for immunization: Secondary | ICD-10-CM

## 2019-03-12 MED ORDER — QVAR REDIHALER 80 MCG/ACT IN AERB: 2.0000 | INHALATION_SPRAY | Freq: Two times a day (BID) | RESPIRATORY_TRACT | 5 refills | Status: DC

## 2019-03-12 MED ORDER — EPINEPHRINE 0.3 MG/0.3ML IJ SOAJ: 0.3000 mg | Freq: Once | INTRAMUSCULAR | 11 refills | Status: DC

## 2019-03-12 NOTE — Addendum Note (Signed)
Addended by: Parke Poisson E on: 03/12/2019 03:28 PM   Modules accepted: Orders

## 2019-03-12 NOTE — Assessment & Plan Note (Signed)
Controlled on current regimen.   

## 2019-03-12 NOTE — Addendum Note (Signed)
Addended by: Parke Poisson E on: 03/12/2019 03:27 PM   Modules accepted: Orders

## 2019-03-12 NOTE — Assessment & Plan Note (Signed)
Excellent control   Plan  Patient Instructions  Continue Xolair injections every 3 weeks Continue Qvar 2 puffs twice a day rinse after use.  Albuterol inhaler As needed  Wheezing  Follow up with Dr. Lamonte Sakai  In 6 months and As needed

## 2019-03-12 NOTE — Progress Notes (Signed)
@Patient  ID: Kathryn Griffin, female    DOB: 15-Feb-1956, 63 y.o.   MRN: [id number]  Chief Complaint  Patient presents with  . Follow-up    Asthma    Referring provider: No ref. provider found  HPI: 63 year old female followed for severe persistent asthma and allergic rhinitis  previous aspergillus status post enterococcus all x1 month and January 2015  TEST/EVENTS :  Bronchoscopy in 04/2013 showed mucus plugging/cast LUL. Bronch cultures grew aspergillus species.  Methacholine challenge 01/2013 negative.   gE low/normal 2014.  Eosinophil elevated one time in early 2014.   She was treated with itraconazole for a month and did well by January 2015.  04/2013 bronch > large left upper lobe mucus plug/cast causing left upper lobe collapse 04/2013 bronch culture> aspergillus species 05/2013 IgE 156 05/2013 Apergillus precipitans > low positive 07/2013 sputum culture > negative 08/2013 sputum culture > aspergillus sp 01/2014 sputum culture > aspergillus sp  Labs: 09/2016 absolute eosinophil 200 cell/dL, IgE 227   started Xolair in 08/2017  03/12/2019 Follow up : Asthma and AR  Patient presents for a follow-up visit.  Last seen December 2019.  She says she has been doing very well on her current regimen.  She denies any flare of cough or wheezing.  She remains on Qvar 2 puffs twice daily.  And Xolair injections every 3 weeks.. Since starting Xolair she is much improved with her asthma control.  Has rare use of her albuterol inhaler. She retired from teaching approximately 1 year ago.  Says that she feels this is also helped as she felt that her building had mold in it  Says allergies have been under good control.  With no increased postnasal drainage or nasal congestion    Allergies  Allergen Reactions  . Molds & Smuts Shortness Of Breath  . Dexilant [Dexlansoprazole] Anxiety  . Protonix [Pantoprazole Sodium] Anxiety    Immunization History  Administered Date(s)  Administered  . Influenza Split 04/19/2013, 05/10/2015, 04/23/2016  . Influenza Whole 03/27/2012, 04/29/2014  . Influenza,inj,Quad PF,6+ Mos 04/12/2017, 04/22/2018, 03/12/2019  . Pneumococcal Conjugate-13 01/21/2015  . Pneumococcal Polysaccharide-23 10/15/2012    Past Medical History:  Diagnosis Date  . Aspergillus fumigatus ([place]) 11/14  . Hx of gestational diabetes mellitus, not currently pregnant   . Osteopenia 07/04/2017   T score -2.1 FRAX 9.6% / 1.3%  . Prediabetes   . Reflux   . Vaginal atrophy   . Vitamin D deficiency     Tobacco History: Social History   Tobacco Use  Smoking Status Never Smoker  Smokeless Tobacco Never Used   Counseling given: Not Answered   Outpatient Medications Prior to Visit  Medication Sig Dispense Refill  . albuterol (PROVENTIL) (2.5 MG/3ML) 0.083% nebulizer solution USE 1 VIAL VIA NEBULIZER EVERY 6 HOURS AS NEEDED FOR WHEEZING OR SHORTNESS OF BREATH 360 mL 11  . albuterol (VENTOLIN HFA) 108 (90 Base) MCG/ACT inhaler inhale 2 puffs by mouth every 4 hours if needed 18 g 5  . Calcium Citrate (CITRACAL PO) Take 1 tablet by mouth daily at 8 pm.     . Cholecalciferol (VITAMIN D) 2000 units CAPS Take 1 capsule by mouth daily.    Marland Kitchen conjugated estrogens (PREMARIN) vaginal cream Apply 1/4 of applicator twice weekly vaginally and small amount on the vulva as needed 42.5 g 3  . EPINEPHrine (EPIPEN 2-PAK) 0.3 mg/0.3 mL IJ SOAJ injection Inject 0.3 mLs (0.3 mg total) into the muscle once. 1 Device 0  . famotidine (PEPCID) 20  MG tablet Take 20 mg by mouth as needed.     . fexofenadine (ALLEGRA) 180 MG tablet Take 180 mg by mouth daily.    . fluticasone (FLONASE) 50 MCG/ACT nasal spray Place 2 sprays into both nostrils daily.     . meloxicam (MOBIC) 15 MG tablet Take 1 tablet by mouth daily as needed.   1  . Multiple Vitamin (MULTIVITAMIN) capsule Take 1 capsule by mouth daily.      Marland Kitchen omalizumab (XOLAIR) 150 MG injection Inject into the skin. Every three  weeks    . omeprazole (PRILOSEC) 40 MG capsule Take 40 mg by mouth daily.    . pimecrolimus (ELIDEL) 1 % cream Apply 1 application topically 2 (two) times daily.    Marland Kitchen QVAR REDIHALER 80 MCG/ACT inhaler INHALE 2 PUFFS INTO THE LUNGS TWICE DAILY 10.6 g 3  . Respiratory Therapy Supplies (FLUTTER) DEVI Use as directed 1 each 0  . sodium chloride HYPERTONIC 3 % nebulizer solution Take by nebulization 2 (two) times daily. 750 mL 11  . Spacer/Aero-Holding Chambers (AEROCHAMBER MV) inhaler Use as instructed 1 each 0   Facility-Administered Medications Prior to Visit  Medication Dose Route Frequency Provider Last Rate Last Dose  . omalizumab Arvid Right) injection 225 mg  225 mg Subcutaneous Q14 Days Simonne Maffucci B, MD   225 mg at 08/06/18 1446  . omalizumab Arvid Right) injection 225 mg  225 mg Subcutaneous Q21 days Simonne Maffucci B, MD   225 mg at 01/07/19 1508  . omalizumab Arvid Right) injection 225 mg  225 mg Subcutaneous Q14 Days Simonne Maffucci B, MD   225 mg at 02/18/19 1436  . omalizumab Arvid Right) injection 300 mg  300 mg Subcutaneous Q14 Days Simonne Maffucci B, MD   300 mg at 03/11/19 1355     Review of Systems:   Constitutional:   No  weight loss, night sweats,  Fevers, chills, fatigue, or  lassitude.  HEENT:   No headaches,  Difficulty swallowing,  Tooth/dental problems, or  Sore throat,                No sneezing, itching, ear ache, nasal congestion, post nasal drip,   CV:  No chest pain,  Orthopnea, PND, swelling in lower extremities, anasarca, dizziness, palpitations, syncope.   GI  No heartburn, indigestion, abdominal pain, nausea, vomiting, diarrhea, change in bowel habits, loss of appetite, bloody stools.   Resp: No shortness of breath with exertion or at rest.  No excess mucus, no productive cough,  No non-productive cough,  No coughing up of blood.  No change in color of mucus.  No wheezing.  No chest wall deformity  Skin: no rash or lesions.  GU: no dysuria, change in color of  urine, no urgency or frequency.  No flank pain, no hematuria   MS:  No joint pain or swelling.  No decreased range of motion.  No back pain.    Physical Exam  BP 108/64 (BP Location: Left Arm, Cuff Size: Normal)   Pulse 76   Temp 98.2 F (36.8 C) (Temporal)   Ht 5' 7.5" (1.715 m)   Wt 150 lb 4.8 oz (68.2 kg)   SpO2 97%   BMI 23.19 kg/m   GEN: A/Ox3; pleasant , NAD, well nourished    HEENT:  [place]/AT,  NOSE-clear, THROAT-clear, no lesions, no postnasal drip or exudate noted.   NECK:  Supple w/ fair ROM; no JVD; normal carotid impulses w/o bruits; no thyromegaly or nodules palpated; no lymphadenopathy.  RESP  Clear  P & A; w/o, wheezes/ rales/ or rhonchi. no accessory muscle use, no dullness to percussion  CARD:  RRR, no m/r/g, no peripheral edema, pulses intact, no cyanosis or clubbing.  GI:   Soft & nt; nml bowel sounds; no organomegaly or masses detected.   Musco: Warm bil, no deformities or joint swelling noted.   Neuro: alert, no focal deficits noted.    Skin: Warm, no lesions or rashes    Lab Results:  CBC  BNP No results found for: BNP  ProBNP No results found for: PROBNP  Imaging: No results found.  omalizumab Arvid Right) injection 225 mg    Date Action Dose Route User   01/28/2019 1453 Given 225 mg Subcutaneous (Other) Rinaldo Ratel, CMA    omalizumab Arvid Right) injection 225 mg    Date Action Dose Route User   02/18/2019 1436 Given 225 mg Subcutaneous (Other) Randa Spike, CMA    omalizumab Arvid Right) injection 300 mg    Date Action Dose Route User   03/11/2019 1355 Given 300 mg Subcutaneous (Other) Randa Spike, CMA      No flowsheet data found.  No results found for: NITRICOXIDE      Assessment & Plan:   Asthma Excellent control   Plan  Patient Instructions  Continue Xolair injections every 3 weeks Continue Qvar 2 puffs twice a day rinse after use.  Albuterol inhaler As needed  Wheezing  Follow up with Dr. Lamonte Sakai  In 6  months and As needed        Allergic rhinitis Controlled on current regimen      Rexene Edison, NP 03/12/2019

## 2019-03-12 NOTE — Patient Instructions (Signed)
Continue Xolair injections every 3 weeks Continue Qvar 2 puffs twice a day rinse after use.  Albuterol inhaler As needed  Wheezing  Follow up with Dr. Lamonte Sakai  In 6 months and As needed

## 2019-03-25 ENCOUNTER — Telehealth: Payer: Self-pay | Admitting: Pulmonary Disease

## 2019-03-25 NOTE — Telephone Encounter (Signed)
Xolair Order: # vials: 4  Ordered date: 03/25/2019 Expected date of arrival: 03/27/2019 Ordered by: Desmond Dike, [place]  Specialty Pharmacy: [facility]

## 2019-03-26 ENCOUNTER — Encounter: Payer: Self-pay | Admitting: Gynecology

## 2019-03-27 NOTE — Telephone Encounter (Signed)
Xolair Received: # Vials: 2 Medication arrival date: 03/27/19 Lot #: [id number] Exp date: 07/28/2022 Received by: Lattie Haw, LPN

## 2019-04-01 ENCOUNTER — Ambulatory Visit (INDEPENDENT_AMBULATORY_CARE_PROVIDER_SITE_OTHER): Payer: 59

## 2019-04-01 ENCOUNTER — Ambulatory Visit: Payer: 59

## 2019-04-01 ENCOUNTER — Other Ambulatory Visit: Payer: Self-pay

## 2019-04-01 DIAGNOSIS — J4550 Severe persistent asthma, uncomplicated: Secondary | ICD-10-CM

## 2019-04-01 MED ORDER — OMALIZUMAB 150 MG ~~LOC~~ SOLR
225.0000 mg | Freq: Once | SUBCUTANEOUS | Status: AC
  Administered 2019-04-01: 225 mg via SUBCUTANEOUS

## 2019-04-01 NOTE — Progress Notes (Signed)
Have you been hospitalized within the last 10 days?  No Do you have a fever?  No Do you have a cough?  No Do you have a headache or sore throat? No Do you have your Epi Pen visible and is it within date?  Yes 

## 2019-04-15 ENCOUNTER — Telehealth: Payer: Self-pay | Admitting: Pulmonary Disease

## 2019-04-15 NOTE — Telephone Encounter (Signed)
Xolair Order: # vials: 2 Ordered date: 04/15/19 Expected date of arrival: 04/17/19 Ordered by: [place]: Lennette Bihari

## 2019-04-17 NOTE — Telephone Encounter (Signed)
Xolair Received: # Vials: 2 Medication arrival date: 04/17/2019 Lot #: [id number] Exp date: 07/2021 Received by: Desmond Dike, [place]

## 2019-04-22 ENCOUNTER — Other Ambulatory Visit: Payer: Self-pay

## 2019-04-22 ENCOUNTER — Ambulatory Visit (INDEPENDENT_AMBULATORY_CARE_PROVIDER_SITE_OTHER): Payer: 59

## 2019-04-22 DIAGNOSIS — J4550 Severe persistent asthma, uncomplicated: Secondary | ICD-10-CM | POA: Diagnosis not present

## 2019-04-22 MED ORDER — OMALIZUMAB 150 MG ~~LOC~~ SOLR
225.0000 mg | Freq: Once | SUBCUTANEOUS | Status: AC
  Administered 2019-04-22: 225 mg via SUBCUTANEOUS

## 2019-04-22 NOTE — Progress Notes (Signed)
Have you been hospitalized within the last 10 days?  No Do you have a fever?  No Do you have a cough?  No Do you have a headache or sore throat? No Do you have your Epi Pen visible and is it within date?  Yes 

## 2019-05-07 ENCOUNTER — Telehealth: Payer: Self-pay | Admitting: Pulmonary Disease

## 2019-05-07 NOTE — Telephone Encounter (Signed)
Called [facility] to schedule shipment. Was advised that it's too early to set up shipment. I would need to call back in 3-5 days. Will follow up.

## 2019-05-09 NOTE — Telephone Encounter (Signed)
Called [facility] back to follow up on this as the pt has an appointment on 05/13/2019. Spoke with [employer]. He completed the shipment for me.  Xolair Order: # vials: 4 Ordered date: 05/09/2019 Expected date of arrival: 05/14/2019 Ordered by: Desmond Dike, [place]  Specialty Pharmacy: [facility] for pt to move her appointment from 05/13/2019.

## 2019-05-10 NOTE — Telephone Encounter (Signed)
Spoke with pt. Her appointment had already been rescheduled to 05/15/2019. Will await shipment.

## 2019-05-13 ENCOUNTER — Ambulatory Visit: Payer: 59

## 2019-05-14 NOTE — Telephone Encounter (Signed)
Xolair Received: # Vials: 2 Medication arrival date: 05/14/19 Lot #: [id number] Exp date: 12/26/2022 Received by: Elliot Dally

## 2019-05-15 ENCOUNTER — Other Ambulatory Visit: Payer: Self-pay

## 2019-05-15 ENCOUNTER — Ambulatory Visit (INDEPENDENT_AMBULATORY_CARE_PROVIDER_SITE_OTHER): Payer: 59

## 2019-05-15 DIAGNOSIS — J4550 Severe persistent asthma, uncomplicated: Secondary | ICD-10-CM | POA: Diagnosis not present

## 2019-05-15 MED ORDER — OMALIZUMAB 150 MG ~~LOC~~ SOLR
225.0000 mg | SUBCUTANEOUS | Status: DC
  Administered 2019-05-15: 225 mg via SUBCUTANEOUS

## 2019-05-15 NOTE — Progress Notes (Signed)
Have you been hospitalized within the last 10 days?  No Do you have a fever?  No Do you have a cough?  No Do you have a headache or sore throat? No Do you have your Epi Pen visible and is it within date?  Yes 

## 2019-05-27 ENCOUNTER — Telehealth: Payer: Self-pay

## 2019-05-27 NOTE — Telephone Encounter (Signed)
Xolair Order: # vials: 2 Ordered date: 05/27/19  Expected date of arrival:  Ordered by: Len Blalock, [place]: [facility]   Pharmacy benefits is still being processed per rep, they will be calling us tomorrow to schedule a delivery.  Will keep message open to follow up on.

## 2019-06-04 NOTE — Telephone Encounter (Signed)
Called [facility] to schedule patient shipment.  Was informed a new PA is needed. Spoke with PA department.  Xolair 225mg  every 3 weeks. Diagnosis code J45.50- severe persistent asthma. Recent office notes, med list, and labs requested to be sent to fax # [phone].  PA # [id number]. Requested information faxed and confirmation received.

## 2019-06-04 NOTE — Telephone Encounter (Signed)
Patient injection cancelled for 06/05/19. Patient aware PA is in process and we will contact her as soon as shipment is set to schedule injection.

## 2019-06-05 ENCOUNTER — Ambulatory Visit: Payer: 59

## 2019-06-05 NOTE — Telephone Encounter (Signed)
Called [facility] to follow up on Xolair PA.  PA still pending, but was given number [phone] to contact PA dept to confirm status. Per Joellen Jersey at [employer] PA is pending. Will continue to follow up.

## 2019-06-07 MED ORDER — OMALIZUMAB 150 MG ~~LOC~~ SOLR: 225.0000 mg | SUBCUTANEOUS | 11 refills | Status: DC

## 2019-06-07 NOTE — Telephone Encounter (Signed)
Fax received from [facility] requesting new Xolair 225mg  every 3 weeks prescription to be placed.   New Prescription sent to [facility] for continuation of Xolair 225mg  every 3 weeks.

## 2019-06-07 NOTE — Telephone Encounter (Signed)
Called [facility] to follow up on Xolair PA and shipment.   Spoke with pharmacy team, earliest shipment date for office arrival is 06/11/19. Spoke with [place] team,  to check on status of Xolair PA.  Xolair PA is stilling pending.  Barnetta Chapel stated there is no time limit on PA approval.   Barnetta Chapel stated she would place a stat expedite to hurry process.  All requested documentation was received via fax.  Called Patient.  Patient is aware of pending approval. Will follow up with [facility] and Patient.

## 2019-06-07 NOTE — Telephone Encounter (Signed)
Pt following up regarding Xolair injection.  [phone].

## 2019-06-07 NOTE — Telephone Encounter (Signed)
Called [facility] to follow up on Xolair approval and schedule delivery.  Spoke with Rachael.  Rachael stated pharmacy is currently working on Patient's approval. Approval dates 06/04/19-06/02/20.  Rachael stated [facility] should contact Patient Monday to get new shipment consent.   Called and spoke with Patient.  Patient aware of approval and [facility] needing new shipment consent. Will follow up Monday for scheduled shipment.   Member ID # [id number].

## 2019-06-07 NOTE — Telephone Encounter (Signed)
Received call from Ivin Booty, Therapist, sports at [employer]. Patient has been approved 06/04/19-06/02/2020. PA authorization # [id number]. Attempted to set up delivery, but was told expedition team did not have PA ready for pharmacy to ship. I was told it could be ready to set up shipment this afternoon, or 24-48 hours. Will continue to follow up for delivery.

## 2019-06-10 ENCOUNTER — Telehealth: Payer: Self-pay | Admitting: Adult Health

## 2019-06-10 ENCOUNTER — Telehealth: Payer: Self-pay | Admitting: Emergency Medicine

## 2019-06-10 NOTE — Telephone Encounter (Signed)
ATC [facility] to follow up on this matter. Spoke with a representative but was then hung up on. Tried to call back but was placed on a very long hold. Will try back.

## 2019-06-11 NOTE — Telephone Encounter (Signed)
Spoke with [employer]. They were needing the supervising MD's information for the pt's Xolair as it's listed under Tammy Parrett. RB's information has been given to them. They will now process the pt's prescription and contact us for shipment.

## 2019-06-11 NOTE — Telephone Encounter (Signed)
Called [facility], they have been RB's information. Nothing further was needed.

## 2019-06-11 NOTE — Telephone Encounter (Signed)
Nothing noted in encounter. Will sign off.  

## 2019-06-12 ENCOUNTER — Telehealth: Payer: Self-pay | Admitting: Emergency Medicine

## 2019-06-12 MED ORDER — OMALIZUMAB 150 MG ~~LOC~~ SOLR: 225.0000 mg | SUBCUTANEOUS | 11 refills | Status: DC

## 2019-06-12 NOTE — Telephone Encounter (Signed)
Spoke with pt. Advised her that we are still waiting on the pharmacy to set up shipment. I will contact her as soon as we know when the medication is going to be here. She verbalized understanding.

## 2019-06-12 NOTE — Telephone Encounter (Signed)
Pt called states that [facility] called her that there was question on the quantity of the Xolair - pt can be reached at [phone]

## 2019-06-12 NOTE — Telephone Encounter (Signed)
Looking at the prescription that was sent to [place], it was sent for the wrong quantity. The quantity should be #2 not #1. This has been the hold up with getting the medication shipped to [place]. I have gave a verbal to a pharmacist to change the quantity.  Xolair Order: # vials: 2 Ordered date: 06/12/2019 Expected date of arrival: 06/14/2019 Ordered by: Desmond Dike, [place]  Specialty Pharmacy: [facility] with pt. She has been scheduled for her injection on 06/14/2019 at 1400. Will await shipment.

## 2019-06-14 ENCOUNTER — Other Ambulatory Visit: Payer: Self-pay

## 2019-06-14 ENCOUNTER — Ambulatory Visit (INDEPENDENT_AMBULATORY_CARE_PROVIDER_SITE_OTHER): Payer: 59

## 2019-06-14 DIAGNOSIS — J4550 Severe persistent asthma, uncomplicated: Secondary | ICD-10-CM | POA: Diagnosis not present

## 2019-06-14 MED ORDER — OMALIZUMAB 150 MG ~~LOC~~ SOLR
225.0000 mg | Freq: Once | SUBCUTANEOUS | Status: AC
  Administered 2019-06-14: 225 mg via SUBCUTANEOUS

## 2019-06-14 NOTE — Telephone Encounter (Signed)
Xolair Received: # Vials: 2 Medication arrival date: 06/14/19 Lot #: [id number] Exp date: 12/26/2022 Received by: Elliot Dally

## 2019-06-14 NOTE — Progress Notes (Signed)
Have you been hospitalized within the last 10 days?  No Do you have a fever?  No Do you have a cough?  No Do you have a headache or sore throat? No Do you have your Epi Pen visible and is it within date?  Yes 

## 2019-06-24 ENCOUNTER — Telehealth: Payer: Self-pay | Admitting: Emergency Medicine

## 2019-06-24 NOTE — Telephone Encounter (Signed)
ATC [facility] to set up Xolair delivery.  Patient is scheduled 07/04/18. Xolair vials can not be scheduled until 06/27/19.  Will call back 12/31 to set up delivery.

## 2019-06-26 NOTE — Telephone Encounter (Signed)
Called [facility] to follow up on Xolair delivery.  Xolair order taken by Levada Dy, [facility].  Levada Dy stated she would call Monday 07/01/2019 to confirm delivery time.  Levada Dy stated Xolair would be delivered 1/5 or 07/03/2019.  Xolair Order: # vials: 2 Ordered date: 06/26/19 Expected date of arrival: 07/02/2019 or 07/03/2019 Ordered by: [place]: Lennette Bihari

## 2019-07-03 ENCOUNTER — Other Ambulatory Visit: Payer: Self-pay

## 2019-07-03 NOTE — Telephone Encounter (Signed)
Received a call from [facility]. Pt's medication should be delivered tomorrow 07/04/19.

## 2019-07-04 ENCOUNTER — Ambulatory Visit (INDEPENDENT_AMBULATORY_CARE_PROVIDER_SITE_OTHER): Payer: 59 | Admitting: Obstetrics & Gynecology

## 2019-07-04 ENCOUNTER — Encounter: Payer: Self-pay | Admitting: Obstetrics & Gynecology

## 2019-07-04 VITALS — BP 120/78 | Ht 67.0 in | Wt 149.0 lb

## 2019-07-04 DIAGNOSIS — Z01419 Encounter for gynecological examination (general) (routine) without abnormal findings: Secondary | ICD-10-CM | POA: Diagnosis not present

## 2019-07-04 DIAGNOSIS — Z780 Asymptomatic menopausal state: Secondary | ICD-10-CM | POA: Diagnosis not present

## 2019-07-04 DIAGNOSIS — M8589 Other specified disorders of bone density and structure, multiple sites: Secondary | ICD-10-CM

## 2019-07-04 NOTE — Telephone Encounter (Signed)
Xolair Received: # Vials: 2 Medication arrival date: 07/04/2019 Lot #: [id number] Exp date: 12/2022 Received by: Desmond Dike, [place]

## 2019-07-04 NOTE — Progress Notes (Signed)
Kathryn Griffin August 07, 1955 [id number]   History:    64 y.o. G4P2A2L2 Married  RP:  Established patient presenting for annual gyn exam   HPI: Menopause, well on no systemic HRT.  No PMB.  No pelvic pain.  Using Premarin for vaginal dryness. Rarely sexually active currently because of her back and hip issues.  Physical therapy helping her right hip.  Able to walk regularly.  Body mass index 23.34, improved x last year.  Breast normal.  Urine and bowel movements normal.  Health labs with family physician.   Past medical history,surgical history, family history and social history were all reviewed and documented in the EPIC chart.  Gynecologic History No LMP recorded. Patient is postmenopausal.  Obstetric History OB History  Gravida Para Term Preterm AB Living  4 2     2 2   SAB TAB Ectopic Multiple Live Births  2            # Outcome Date GA Lbr Len/2nd Weight Sex Delivery Anes PTL Lv  4 SAB           3 SAB           2 Para           1 Para              ROS: A ROS was performed and pertinent positives and negatives are included in the history.  GENERAL: No fevers or chills. HEENT: No change in vision, no earache, sore throat or sinus congestion. NECK: No pain or stiffness. CARDIOVASCULAR: No chest pain or pressure. No palpitations. PULMONARY: No shortness of breath, cough or wheeze. GASTROINTESTINAL: No abdominal pain, nausea, vomiting or diarrhea, melena or bright red blood per rectum. GENITOURINARY: No urinary frequency, urgency, hesitancy or dysuria. MUSCULOSKELETAL: No joint or muscle pain, no back pain, no recent trauma. DERMATOLOGIC: No rash, no itching, no lesions. ENDOCRINE: No polyuria, polydipsia, no heat or cold intolerance. No recent change in weight. HEMATOLOGICAL: No anemia or easy bruising or bleeding. NEUROLOGIC: No headache, seizures, numbness, tingling or weakness. PSYCHIATRIC: No depression, no loss of interest in normal activity or change in sleep pattern.     Exam:   BP 120/78   Ht 5\' 7"  (1.702 m)   Wt 149 lb (67.6 kg)   BMI 23.34 kg/m   Body mass index is 23.34 kg/m.  General appearance : Well developed well nourished female. No acute distress HEENT: Eyes: no retinal hemorrhage or exudates,  Neck supple, trachea midline, no carotid bruits, no thyroidmegaly Lungs: Clear to auscultation, no rhonchi or wheezes, or rib retractions  Heart: Regular rate and rhythm, no murmurs or gallops Breast:Examined in sitting and supine position were symmetrical in appearance, no palpable masses or tenderness,  no skin retraction, no nipple inversion, no nipple discharge, no skin discoloration, no axillary or supraclavicular lymphadenopathy Abdomen: no palpable masses or tenderness, no rebound or guarding Extremities: no edema or skin discoloration or tenderness  Pelvic: Vulva: Normal             Vagina: No gross lesions or discharge  Cervix: No gross lesions or discharge.  Pap reflex done.  Uterus  AV, normal size, shape and consistency, non-tender and mobile  Adnexa  Without masses or tenderness  Anus: Normal   Assessment/Plan:  64 y.o. female for annual exam   1. Well female exam with routine gynecological exam Normal gynecologic exam in menopause.  Pap reflex done.  Breast exam normal.  Last screening mammogram  July 2020 was negative.  Colonoscopy in 2019.  Health labs with family physician, but will complete here today. - CBC - TSH - VITAMIN D 25 Hydroxy (Vit-D Deficiency, Fractures)  2. Postmenopausal Postmenopausal with no systemic hormone replacement therapy.  No postmenopausal bleeding.  Uses Premarin cream vaginally occasionally, no need for represcription currently.  3. Osteopenia of multiple sites History of osteopenia on last bone density in 06/2017.  We will repeat the bone density now.  Continue with vitamin D supplements, calcium intake of 1200 mg daily and regular weightbearing physical activities. - DG Bone Density;  Future   Princess Bruins MD, 2:58 PM 07/04/2019

## 2019-07-05 ENCOUNTER — Ambulatory Visit: Payer: 59

## 2019-07-05 LAB — PAP IG W/ RFLX HPV ASCU

## 2019-07-05 LAB — CBC
HCT: 41.2 % (ref 35.0–45.0)
Hemoglobin: 13.7 g/dL (ref 11.7–15.5)
MCH: 30.5 pg (ref 27.0–33.0)
MCHC: 33.3 g/dL (ref 32.0–36.0)
MCV: 91.8 fL (ref 80.0–100.0)
MPV: 10.8 fL (ref 7.5–12.5)
Platelets: 315 10*3/uL (ref 140–400)
RBC: 4.49 10*6/uL (ref 3.80–5.10)
RDW: 11.7 % (ref 11.0–15.0)
WBC: 7.3 10*3/uL (ref 3.8–10.8)

## 2019-07-05 LAB — VITAMIN D 25 HYDROXY (VIT D DEFICIENCY, FRACTURES): Vit D, 25-Hydroxy: 51 ng/mL (ref 30–100)

## 2019-07-05 LAB — TSH: TSH: 2.21 mIU/L (ref 0.40–4.50)

## 2019-07-07 ENCOUNTER — Encounter: Payer: Self-pay | Admitting: Obstetrics & Gynecology

## 2019-07-07 NOTE — Patient Instructions (Signed)
1. Well female exam with routine gynecological exam Normal gynecologic exam in menopause.  Pap reflex done.  Breast exam normal.  Last screening mammogram July 2020 was negative.  Colonoscopy in 2019.  Health labs with family physician, but will complete here today. - CBC - TSH - VITAMIN D 25 Hydroxy (Vit-D Deficiency, Fractures)  2. Postmenopausal Postmenopausal with no systemic hormone replacement therapy.  No postmenopausal bleeding.  Uses Premarin cream vaginally occasionally, no need for represcription currently.  3. Osteopenia of multiple sites History of osteopenia on last bone density in 06/2017.  We will repeat the bone density now.  Continue with vitamin D supplements, calcium intake of 1200 mg daily and regular weightbearing physical activities. - DG Bone Density; Future  Kathryn Griffin, it was a pleasure seeing you today!  I will inform you of your results as soon as they are available.

## 2019-07-09 ENCOUNTER — Encounter: Payer: Self-pay | Admitting: Emergency Medicine

## 2019-07-09 ENCOUNTER — Ambulatory Visit (INDEPENDENT_AMBULATORY_CARE_PROVIDER_SITE_OTHER): Payer: 59 | Admitting: Emergency Medicine

## 2019-07-09 ENCOUNTER — Other Ambulatory Visit: Payer: Self-pay

## 2019-07-09 VITALS — BP 114/80 | HR 100 | Ht 67.5 in | Wt 152.0 lb

## 2019-07-09 DIAGNOSIS — J309 Allergic rhinitis, unspecified: Secondary | ICD-10-CM

## 2019-07-09 DIAGNOSIS — J479 Bronchiectasis, uncomplicated: Secondary | ICD-10-CM | POA: Insufficient documentation

## 2019-07-09 DIAGNOSIS — J4550 Severe persistent asthma, uncomplicated: Secondary | ICD-10-CM

## 2019-07-09 LAB — CBC WITH DIFFERENTIAL/PLATELET
Basophils Absolute: 0.1 10*3/uL (ref 0.0–0.1)
Basophils Relative: 1 % (ref 0.0–3.0)
Eosinophils Absolute: 0.3 10*3/uL (ref 0.0–0.7)
Eosinophils Relative: 4.5 % (ref 0.0–5.0)
HCT: 41.7 % (ref 36.0–46.0)
Hemoglobin: 13.7 g/dL (ref 12.0–15.0)
Lymphocytes Relative: 21.1 % (ref 12.0–46.0)
Lymphs Abs: 1.4 10*3/uL (ref 0.7–4.0)
MCHC: 33 g/dL (ref 30.0–36.0)
MCV: 93.6 fl (ref 78.0–100.0)
Monocytes Absolute: 0.6 10*3/uL (ref 0.1–1.0)
Monocytes Relative: 9 % (ref 3.0–12.0)
Neutro Abs: 4.3 10*3/uL (ref 1.4–7.7)
Neutrophils Relative %: 64.4 % (ref 43.0–77.0)
Platelets: 330 10*3/uL (ref 150.0–400.0)
RBC: 4.45 Mil/uL (ref 3.87–5.11)
RDW: 12.9 % (ref 11.5–15.5)
WBC: 6.6 10*3/uL (ref 4.0–10.5)

## 2019-07-09 MED ORDER — OMALIZUMAB 150 MG ~~LOC~~ SOLR
225.0000 mg | Freq: Once | SUBCUTANEOUS | Status: AC
  Administered 2019-07-09: 225 mg via SUBCUTANEOUS

## 2019-07-09 NOTE — Assessment & Plan Note (Signed)
Doing well on flonase prn and allegra. Able to stop Singulair.

## 2019-07-09 NOTE — Addendum Note (Signed)
Addended by: Elton Sin on: 07/09/2019 02:17 PM   Modules accepted: Orders

## 2019-07-09 NOTE — Assessment & Plan Note (Signed)
Plan for repeat CT chest at some point this year to follow stability/progression.  Based on this we will decide whether any additional cultures would be useful.

## 2019-07-09 NOTE — Patient Instructions (Addendum)
Please continue Qvar 2 puffs twice a day as you have been taking it.  Rinse and gargle after using. Keep your albuterol available to use 2 puffs if needed for shortness of breath, chest tightness, wheezing. Lab work today (eosinophil count, IgE, specific Aspergillus IgG) Continue your Xolair treatments as you have been taking them.  We talked today about possibly stopping these at some point midyear depending on your clinical status. We will plan to repeat your CT scan of the chest to follow stability of your bronchiectasis.  We will likely do this at some point in the coming year. Continue fluticasone nasal spray and Allegra as you have been taking them Follow with Dr Lamonte Sakai in 6 months or sooner if you have any problems

## 2019-07-09 NOTE — Progress Notes (Signed)
 This encounter was created in error - please disregard.

## 2019-07-09 NOTE — Addendum Note (Signed)
Addended by: Suzzanne Cloud E on: 07/09/2019 02:15 PM   Modules accepted: Orders

## 2019-07-09 NOTE — Progress Notes (Signed)
Have you been hospitalized within the last 10 days?  No Do you have a fever?  No Do you have a cough?  No Do you have a headache or sore throat? No  

## 2019-07-09 NOTE — Progress Notes (Signed)
Subjective:    Patient ID: Kathryn Griffin, female    DOB: Aug 28, 1955, 64 y.o.   MRN: [id number]  HPI Ms. Harri is 16 and has been followed in our office by Dr. Lake Bells, last seen by T. Parrett in September 2020.  She is a never smoker who has a history of severe persistent asthma, possible ABPA based on bronchoscopy cultures in 04/2013 that grew Aspergillus species although IgE at the time was 156.  There was an elevated eosinophil count on at least 1 sample in 2014.  She received itraconazole but subsequent sputum cultures have also been positive for Aspergillus.  She started Xolair every 3 weeks in 08/2017.  She is currently managed on Qvar 2 puffs twice daily, has albuterol which she rarely needs. She has cough, rarely productive of plugs - most recently last week, was gray, thick. She is active, walks every day. No wheeze.  Currently using flonase prn, allegra qd. Off singulair.    Review of Systems  Past Medical History:  Diagnosis Date  . Aspergillus fumigatus ([place]) 11/14  . Hx of gestational diabetes mellitus, not currently pregnant   . Osteopenia 07/04/2017   T score -2.1 FRAX 9.6% / 1.3%  . Prediabetes   . Reflux   . Vaginal atrophy   . Vitamin D deficiency   . Vitiligo      Family History  Problem Relation Age of Onset  . Cancer Mother        bladder  . Hypertension Mother   . Diabetes Brother   . Hypertension Maternal Grandmother   . Ovarian cancer Maternal Grandmother   . Heart attack Maternal Grandmother   . Diabetes Paternal Grandmother   . COPD Father        heavy smoker  . Prostate cancer Father   . Cancer Father        Lung  . Hypertension Father   . Breast cancer Neg Hx      Social History   Socioeconomic History  . Marital status: Married    Spouse name: Not on file  . Number of children: 2  . Years of education: Not on file  . Highest education level: Not on file  Occupational History  . Occupation: Personnel officer: [employer]  Tobacco Use  . Smoking status: Never Smoker  . Smokeless tobacco: Never Used  Substance and Sexual Activity  . Alcohol use: Yes    Alcohol/week: 4.0 standard drinks    Types: 4 Glasses of wine per week  . Drug use: No  . Sexual activity: Yes    Partners: Male    Birth control/protection: Surgical    Comment: 1st intercourse 64 yo-Fewer than 5 partners-BTL  Other Topics Concern  . Not on file  Social History Narrative  . Not on file   Social Determinants of Health   Financial Resource Strain:   . Difficulty of Paying Living Expenses: Not on file  Food Insecurity:   . Worried About Charity fundraiser in the Last Year: Not on file  . Ran Out of Food in the Last Year: Not on file  Transportation Needs:   . Lack of Transportation (Medical): Not on file  . Lack of Transportation (Non-Medical): Not on file  Physical Activity:   . Days of Exercise per Week: Not on file  . Minutes of Exercise per Session: Not on file  Stress:   . Feeling of Stress : Not on file  Social Connections:   . Frequency of Communication with Friends and Family: Not on file  . Frequency of Social Gatherings with Friends and Family: Not on file  . Attends Religious Services: Not on file  . Active Member of Clubs or Organizations: Not on file  . Attends Archivist Meetings: Not on file  . Marital Status: Not on file  Intimate Partner Violence:   . Fear of Current or Ex-Partner: Not on file  . Emotionally Abused: Not on file  . Physically Abused: Not on file  . Sexually Abused: Not on file     Allergies  Allergen Reactions  . Molds & Smuts Shortness Of Breath  . Dexilant [Dexlansoprazole] Anxiety  . Protonix [Pantoprazole Sodium] Anxiety     Outpatient Medications Prior to Visit  Medication Sig Dispense Refill  . albuterol (PROVENTIL) (2.5 MG/3ML) 0.083% nebulizer solution USE 1 VIAL VIA NEBULIZER EVERY 6 HOURS AS NEEDED FOR WHEEZING OR SHORTNESS OF BREATH 360 mL 11  .  albuterol (VENTOLIN HFA) 108 (90 Base) MCG/ACT inhaler inhale 2 puffs by mouth every 4 hours if needed 18 g 5  . beclomethasone (QVAR REDIHALER) 80 MCG/ACT inhaler Inhale 2 puffs into the lungs 2 (two) times daily. 10.6 g 5  . Calcium Citrate (CITRACAL PO) Take 1 tablet by mouth daily at 8 pm.     . Cholecalciferol (VITAMIN D) 2000 units CAPS Take 1 capsule by mouth daily.    Marland Kitchen conjugated estrogens (PREMARIN) vaginal cream Apply 1/4 of applicator twice weekly vaginally and small amount on the vulva as needed 42.5 g 3  . famotidine (PEPCID) 20 MG tablet Take 20 mg by mouth as needed.     . fexofenadine (ALLEGRA) 180 MG tablet Take 180 mg by mouth daily.    . fluticasone (FLONASE) 50 MCG/ACT nasal spray Place 2 sprays into both nostrils daily.     . meloxicam (MOBIC) 15 MG tablet Take 1 tablet by mouth daily as needed.   1  . Multiple Vitamin (MULTIVITAMIN) capsule Take 1 capsule by mouth daily.      Marland Kitchen omalizumab (XOLAIR) 150 MG injection Inject 225 mg into the skin every 21 ( twenty-one) days. 2 each 11  . omeprazole (PRILOSEC) 40 MG capsule Take 40 mg by mouth daily.    . pimecrolimus (ELIDEL) 1 % cream Apply 1 application topically 2 (two) times daily.    Marland Kitchen Respiratory Therapy Supplies (FLUTTER) DEVI Use as directed 1 each 0  . sodium chloride HYPERTONIC 3 % nebulizer solution Take by nebulization 2 (two) times daily. 750 mL 11  . Spacer/Aero-Holding Chambers (AEROCHAMBER MV) inhaler Use as instructed 1 each 0   Facility-Administered Medications Prior to Visit  Medication Dose Route Frequency Provider Last Rate Last Admin  . omalizumab Arvid Right) injection 225 mg  225 mg Subcutaneous Q14 Days Simonne Maffucci B, MD   225 mg at 08/06/18 1446  . omalizumab Arvid Right) injection 225 mg  225 mg Subcutaneous Q21 days Simonne Maffucci B, MD   225 mg at 01/07/19 1508  . omalizumab Arvid Right) injection 225 mg  225 mg Subcutaneous Q14 Days Simonne Maffucci B, MD   225 mg at 02/18/19 1436  . omalizumab  Arvid Right) injection 225 mg  225 mg Subcutaneous Q14 Days Simonne Maffucci B, MD   225 mg at 05/15/19 1425  . omalizumab Arvid Right) injection 300 mg  300 mg Subcutaneous Q14 Days Simonne Maffucci B, MD   300 mg at 03/11/19 1355  Objective:   Physical Exam Vitals:   07/09/19 1332  BP: 114/80  Pulse: 100  SpO2: 96%  Weight: 152 lb (68.9 kg)  Height: 5' 7.5" (1.715 m)   Gen: Pleasant, well-nourished, in no distress,  normal affect  ENT: No lesions,  mouth clear,  oropharynx clear, no postnasal drip  Neck: No JVD, no stridor  Lungs: No use of accessory muscles, no crackles or wheezing on normal respiration, no wheeze on forced expiration  Cardiovascular: RRR, heart sounds normal, no murmur or gallops, no peripheral edema  Musculoskeletal: No deformities, no cyanosis or clubbing  Neuro: alert, awake, non focal  Skin: Warm, no lesions or rash      Assessment & Plan:  Asthma And probable ABPA, remained colonized as of her most recent sputum 2015.  Clinically she is doing quite well since initiation of Xolair.  She was able to come off Singulair.  Continues Qvar and rarely needs albuterol.  We talked briefly about possibly stopping Xolair at some point in the future to see if she will tolerate.  I think will be reasonable to try this later in the year.  I would like to get her through the spring months since this is her most active season.  Bronchiectasis without complication ([place]) Plan for repeat CT chest at some point this year to follow stability/progression.  Based on this we will decide whether any additional cultures would be useful.  Allergic rhinitis Doing well on flonase prn and allegra. Able to stop Singulair.   Baltazar Apo, MD, PhD 07/09/2019, 2:06 PM [facility] [phone] or if no answer [phone]

## 2019-07-09 NOTE — Assessment & Plan Note (Signed)
And probable ABPA, remained colonized as of her most recent sputum 2015.  Clinically she is doing quite well since initiation of Xolair.  She was able to come off Singulair.  Continues Qvar and rarely needs albuterol.  We talked briefly about possibly stopping Xolair at some point in the future to see if she will tolerate.  I think will be reasonable to try this later in the year.  I would like to get her through the spring months since this is her most active season.

## 2019-07-10 LAB — IGE: IgE (Immunoglobulin E), Serum: 455 kU/L — ABNORMAL HIGH (ref <=114)

## 2019-07-10 LAB — IGG, IGA, IGM
IgG (Immunoglobin G), Serum: 1219 mg/dL (ref 600–1540)
IgM, Serum: 66 mg/dL (ref 50–300)
Immunoglobulin A: 197 mg/dL (ref 70–320)

## 2019-07-15 NOTE — Telephone Encounter (Signed)
Mychart message sent by pt in regards to recent lab results and also about her Xolair. Tammy, please advise on this for pt. Thanks!

## 2019-07-15 NOTE — Telephone Encounter (Signed)
IgE is elevated . However Dr. Lamonte Sakai  Notes indicates she is doing very well on current regimen so no change should be needed. Dr. Lamonte Sakai  Will be in touch once he looks at the labs and applies to her individual case and personalized treatment regimen .

## 2019-07-17 ENCOUNTER — Other Ambulatory Visit: Payer: Self-pay

## 2019-07-22 ENCOUNTER — Telehealth: Payer: Self-pay | Admitting: Emergency Medicine

## 2019-07-22 NOTE — Telephone Encounter (Signed)
Xolair Order: # vials: 2 Ordered date: 07/22/2019 Expected date of arrival: 07/24/2019 Ordered by: Desmond Dike, [place]  Specialty Pharmacy: [facility]

## 2019-07-24 NOTE — Telephone Encounter (Signed)
Xolair Received: # Vials: 2 Medication arrival date: 07/24/2019 Lot #: [id number] Exp date: 02/2023 Received by: Desmond Dike, [place]

## 2019-07-30 ENCOUNTER — Other Ambulatory Visit: Payer: Self-pay

## 2019-07-30 ENCOUNTER — Telehealth: Payer: Self-pay | Admitting: Emergency Medicine

## 2019-07-30 ENCOUNTER — Ambulatory Visit (INDEPENDENT_AMBULATORY_CARE_PROVIDER_SITE_OTHER): Payer: 59

## 2019-07-30 DIAGNOSIS — J4550 Severe persistent asthma, uncomplicated: Secondary | ICD-10-CM

## 2019-07-30 MED ORDER — OMALIZUMAB 150 MG/ML ~~LOC~~ SOSY
225.0000 mg | PREFILLED_SYRINGE | Freq: Once | SUBCUTANEOUS | Status: AC
  Administered 2019-07-30: 225 mg via SUBCUTANEOUS

## 2019-07-30 NOTE — Telephone Encounter (Addendum)
Spoke with pt, she states the insurance will not cover the Qvar and I wanted to see if we could do a PA. I will initiate PA through Covermymeds. Will check status and advise pt.   She has tried and failed Symbicort, Shaelee Hursey [place]: [id number] - PA Case ID: [id number] Need help? Call us at [phone] Status Sent to Redbird Smith 80MCG/ACT aerosol Form [facility] Electronic Prior Authorization Form [id number] NCPDP)

## 2019-07-30 NOTE — Progress Notes (Signed)
All questions were answered by the patient before medication was administered. Have you been hospitalized in the last 10 days? No Do you have a fever? No Do you have a cough? No Do you have a headache or sore throat? No  

## 2019-07-31 NOTE — Telephone Encounter (Signed)
Message below forwarded to pharmacy team as FYI.  I called the patient back and she stated the letter received in November regarding the Qvar, also noted on it were the alternative medications.  Patient stated when she called [facility] [employer] directly, they could not explain why the letter stated approval, and now it is not covered.   I asked the patient to fax a copy of the letters to our office to the attention of Dr. Lamonte Sakai / Pharmacy Team. So that once received an appeal can be started (the letter can be sent with the appeal to [facility]). Patient confirmed that she still has 1 month of the Qvar left to use.

## 2019-07-31 NOTE — Telephone Encounter (Signed)
Patient states has letter from Nov. 2020 from insurance company stating Qvar is covered by them.  Can send letter with appeal to [employer].  Patient phone number is [phone].

## 2019-07-31 NOTE — Telephone Encounter (Signed)
It sounds like Qvar is not covered? If we confirm this to be true then I am OK with Flovent 2 puffs bid as an alternative

## 2019-07-31 NOTE — Telephone Encounter (Signed)
Checked CMM and it states Qvar was denied. Called and spoke to pt. Pt states she received a letter from insurance stating Arnuity, Flovent hfa and diskus, and Pulmicort are covered.   Dr. Lamonte Sakai please advise if you would like to script one of the covered meds or appeal the Qvar denial. Thanks.

## 2019-07-31 NOTE — Telephone Encounter (Signed)
Based on message notation dated 07/31/19 12:23 pm this was routed to Dr. Lamonte Sakai showing med was denied and alternatives. Will await response.

## 2019-08-01 NOTE — Telephone Encounter (Signed)
Hinton Dyer, please advise if this has been received yet?

## 2019-08-05 ENCOUNTER — Other Ambulatory Visit: Payer: Self-pay

## 2019-08-05 NOTE — Telephone Encounter (Signed)
I have not seen any documents in RB's look at. May be the pharmacy team received this?

## 2019-08-05 NOTE — Telephone Encounter (Signed)
Called the patient to make her aware the appeal was being worked on and she will be advised once a response has been received from her insurance company. Patient voiced understanding, nothing further needed at this time.

## 2019-08-06 ENCOUNTER — Telehealth: Payer: Self-pay | Admitting: *Deleted

## 2019-08-06 NOTE — Telephone Encounter (Signed)
Patient called requesting Dx Code for dexa scan, patient calling insurance company to check coverage. M85.89 given to patient.

## 2019-08-07 ENCOUNTER — Other Ambulatory Visit: Payer: Self-pay | Admitting: Emergency Medicine

## 2019-08-07 MED ORDER — QVAR REDIHALER 80 MCG/ACT IN AERB: 2.0000 | INHALATION_SPRAY | Freq: Two times a day (BID) | RESPIRATORY_TRACT | 5 refills | Status: DC

## 2019-08-07 NOTE — Telephone Encounter (Signed)
Received faxed refill request from [facility]  Medication name/strength/dose: QVAR 60mcg Medication last rx'd: 03/12/19 by Rexene Edison NP Quantity and number of refills last rx'd: #1 inhaler with 5 additional refills  Patient last seen in the office on 07/18/19 to establish with Dr Lamonte Sakai (former [facility] patient)  Does refill need to be authorized by a provider? no Refill authorized (yes or no)?: yes

## 2019-08-12 ENCOUNTER — Ambulatory Visit (INDEPENDENT_AMBULATORY_CARE_PROVIDER_SITE_OTHER): Payer: 59 | Admitting: Obstetrics & Gynecology

## 2019-08-12 ENCOUNTER — Telehealth: Payer: Self-pay | Admitting: *Deleted

## 2019-08-12 ENCOUNTER — Encounter: Payer: Self-pay | Admitting: Obstetrics & Gynecology

## 2019-08-12 ENCOUNTER — Other Ambulatory Visit: Payer: Self-pay

## 2019-08-12 ENCOUNTER — Telehealth: Payer: Self-pay | Admitting: Emergency Medicine

## 2019-08-12 VITALS — BP 122/80

## 2019-08-12 DIAGNOSIS — R350 Frequency of micturition: Secondary | ICD-10-CM | POA: Diagnosis not present

## 2019-08-12 MED ORDER — SULFAMETHOXAZOLE-TRIMETHOPRIM 800-160 MG PO TABS: 1.0000 | ORAL_TABLET | Freq: Two times a day (BID) | ORAL | 0 refills | Status: AC

## 2019-08-12 MED ORDER — FLUCONAZOLE 150 MG PO TABS: 150.0000 mg | ORAL_TABLET | Freq: Once | ORAL | 0 refills | Status: AC

## 2019-08-12 MED ORDER — PREMARIN 0.625 MG/GM VA CREA: TOPICAL_CREAM | VAGINAL | 1 refills | Status: DC

## 2019-08-12 NOTE — Telephone Encounter (Signed)
Xolair Order: # vials: 2 Ordered date: 08/12/19 Expected date of arrival: 08/14/19 Ordered by: [place]: [facility]

## 2019-08-12 NOTE — Telephone Encounter (Signed)
Patient called requesting refill on premarin cream , annual exam on 07/04/19. No Rx needed at the time, needs refill now.

## 2019-08-12 NOTE — Patient Instructions (Signed)
1. Frequency of urination Probable acute cystitis.  Decision to start treatment with Bactrim DS.  No contraindication.  Usage reviewed and prescription sent to pharmacy.  Pending urine culture.  Fluconazole 150 mg/tab 1 tablet per mouth x1 after antibiotic treatment as needed for vaginal itching and discharge.  Prescription sent to pharmacy. - Urinalysis,Complete w/RFL Culture  Other orders - sulfamethoxazole-trimethoprim (BACTRIM DS) 800-160 MG tablet; Take 1 tablet by mouth 2 (two) times daily for 3 days. - fluconazole (DIFLUCAN) 150 MG tablet; Take 1 tablet (150 mg total) by mouth once for 1 dose. Take after antibiotic treatment as needed.  Alean, it was a pleasure seeing you today!  I will inform you of your urine culture results as soon as they are available.

## 2019-08-12 NOTE — Progress Notes (Signed)
    Kathryn Griffin 08/13/1955 [id number]        64 y.o.  [id number]  Married  RP: Urinary frequency and burning with miction x last night  HPI: No electricity x 2 days.  Started having urinary frequency and burning with miction last night.  A little better with Azo this am.  No blood in urine seen.  No fever.  No abnormal vaginal discharge, itching or odor.  No pelvic pain.  Not recently sexually active.   OB History  Gravida Para Term Preterm AB Living  4 2     2 2   SAB TAB Ectopic Multiple Live Births  2            # Outcome Date GA Lbr Len/2nd Weight Sex Delivery Anes PTL Lv  4 SAB           3 SAB           2 Para           1 Para             Past medical history,surgical history, problem list, medications, allergies, family history and social history were all reviewed and documented in the EPIC chart.   Directed ROS with pertinent positives and negatives documented in the history of present illness/assessment and plan.  Exam:  Vitals:   08/12/19 1135  BP: 122/80   General appearance:  Normal  CVAT Negative bilaterally  Abdomen: Soft, NT  Gynecologic exam: Vulva normal.  Speculum:  Cervix/Vagina normal.  Normal vaginal secretions.  U/A: Yellow clear, protein negative, nitrite negative, white blood cells 10-20, red blood cells 0-2, bacteria few.  Pending urine culture.   Assessment/Plan:  64 y.o. [id number]   1. Frequency of urination Probable acute cystitis.  Decision to start treatment with Bactrim DS.  No contraindication.  Usage reviewed and prescription sent to pharmacy.  Pending urine culture.  Fluconazole 150 mg/tab 1 tablet per mouth x1 after antibiotic treatment as needed for vaginal itching and discharge.  Prescription sent to pharmacy. - Urinalysis,Complete w/RFL Culture  Other orders - sulfamethoxazole-trimethoprim (BACTRIM DS) 800-160 MG tablet; Take 1 tablet by mouth 2 (two) times daily for 3 days. - fluconazole (DIFLUCAN) 150 MG tablet; Take 1  tablet (150 mg total) by mouth once for 1 dose. Take after antibiotic treatment as needed.  Princess Bruins MD, 11:54 AM 08/12/2019    `

## 2019-08-14 LAB — URINALYSIS, COMPLETE W/RFL CULTURE
Bilirubin Urine: NEGATIVE
Glucose, UA: NEGATIVE
Hyaline Cast: NONE SEEN /LPF
Ketones, ur: NEGATIVE
Nitrites, Initial: NEGATIVE
Protein, ur: NEGATIVE
Specific Gravity, Urine: 1.004 (ref 1.001–1.03)
pH: 6 (ref 5.0–8.0)

## 2019-08-14 LAB — URINE CULTURE
MICRO NUMBER:: 10151957
SPECIMEN QUALITY:: ADEQUATE

## 2019-08-14 LAB — CULTURE INDICATED

## 2019-08-19 NOTE — Telephone Encounter (Signed)
Contacted [facility] to follow up on shipment as this was not received on 08/14/2019. Was advised that the pt's shipment will be here tomorrow 08/20/2019 AM.

## 2019-08-20 ENCOUNTER — Ambulatory Visit: Payer: 59

## 2019-08-20 NOTE — Telephone Encounter (Signed)
Received call from [facility], medication could not be shipped until today. It will arrive tomorrow morning. Pt is aware and her appointment has been rescheduled.

## 2019-08-20 NOTE — Telephone Encounter (Signed)
Pt calling to make sure Xolair has come for injection today.  Please advise.  [phone]

## 2019-08-21 ENCOUNTER — Ambulatory Visit (INDEPENDENT_AMBULATORY_CARE_PROVIDER_SITE_OTHER): Payer: 59

## 2019-08-21 ENCOUNTER — Other Ambulatory Visit: Payer: Self-pay

## 2019-08-21 DIAGNOSIS — J4550 Severe persistent asthma, uncomplicated: Secondary | ICD-10-CM | POA: Diagnosis not present

## 2019-08-21 MED ORDER — OMALIZUMAB 75 MG/0.5ML ~~LOC~~ SOSY
75.0000 mg | PREFILLED_SYRINGE | Freq: Once | SUBCUTANEOUS | Status: AC
  Administered 2019-08-21: 75 mg via SUBCUTANEOUS

## 2019-08-21 MED ORDER — OMALIZUMAB 150 MG/ML ~~LOC~~ SOSY
150.0000 mg | PREFILLED_SYRINGE | Freq: Once | SUBCUTANEOUS | Status: AC
  Administered 2019-08-21: 150 mg via SUBCUTANEOUS

## 2019-08-21 NOTE — Progress Notes (Signed)
All questions were answered by the patient before medication was administered. Have you been hospitalized in the last 10 days? No Do you have a fever? No Do you have a cough? No Do you have a headache or sore throat? No  

## 2019-08-22 NOTE — Telephone Encounter (Signed)
We did not receive the pt's medication yesterday. Scissors to see why. Was advised that their facility was at "capacity" and could not complete the shipment. Medication will be delivered on 08/27/2019.

## 2019-08-27 NOTE — Telephone Encounter (Signed)
Xolair Received: # Vials: 2 Medication arrival date: 08/27/2019 Lot #: [id number] Exp date: 02/2023 Received by: Desmond Dike, [place]

## 2019-08-29 ENCOUNTER — Encounter: Payer: Self-pay | Admitting: Pharmacist

## 2019-08-29 ENCOUNTER — Telehealth: Payer: Self-pay | Admitting: Emergency Medicine

## 2019-08-29 NOTE — Telephone Encounter (Signed)
Patient is calling about QVAR appeal. CB#[phone]

## 2019-08-29 NOTE — Telephone Encounter (Signed)
I called the insurance company and they stated we needed to fax the appeal to [phone]. I printed the letter and faxed to the number given. I called the patient to advise her that the letter was sent  08/03/2019 according to her chart but they never received it. I apologized for the inconvenience and told her to call in a few days to see if we had samples to get her through until we receive the insurance approval. Will leave encounter open to follow up.

## 2019-09-04 ENCOUNTER — Telehealth: Payer: Self-pay | Admitting: Emergency Medicine

## 2019-09-04 NOTE — Telephone Encounter (Signed)
I called and spoke with the patient in regards to her Qvar. She states that the alternative is Arnuity, Flovent HFA & Disc, and Pulmicort.   Tammy, Please advise, since Dr. Lamonte Sakai is not back in the office until next week.

## 2019-09-05 MED ORDER — FLOVENT HFA 220 MCG/ACT IN AERO: 2.0000 | INHALATION_SPRAY | Freq: Two times a day (BID) | RESPIRATORY_TRACT | 5 refills | Status: DC

## 2019-09-05 NOTE — Telephone Encounter (Signed)
Spoke with pt, advised her that we were going to send in Bryan to her pharmacy since insurance covered this alternative. Pt agreed and Rx was sent to [facility] on [address]. Nothing further is needed.

## 2019-09-05 NOTE — Telephone Encounter (Signed)
Flovent 220 2 puffs Twice daily  #1 , 5 refills

## 2019-09-09 ENCOUNTER — Telehealth: Payer: Self-pay | Admitting: Emergency Medicine

## 2019-09-09 MED ORDER — SODIUM CHLORIDE 3 % IN NEBU: INHALATION_SOLUTION | Freq: Two times a day (BID) | RESPIRATORY_TRACT | 11 refills | Status: DC

## 2019-09-09 NOTE — Telephone Encounter (Signed)
Spoke with pt and advised rx sent to pharmacy. Nothing further is needed.   

## 2019-09-09 NOTE — Telephone Encounter (Signed)
Spoke with Kathryn Griffin, she states she is having a flare up from Aspergillus since 08/30/2019. She wants to try the sodium chloride because she feels it helps break up the cough. She doesn't want to take prednisone and antibiotics at this time and wants to see if this will calm down the flare up first. She is coughing up stuff but denies SOB. Dr. Lamonte Sakai is it ok to send in sodium chloride, she hasn't used this medication since 2018. Please advise.

## 2019-09-09 NOTE — Telephone Encounter (Signed)
OK to send script for saline nebs, use bid prn for assistance w secretion clearance.

## 2019-09-09 NOTE — Telephone Encounter (Signed)
Spoke with pt. Advised her that we are recommending 7 days between both injections. Pt's injection appointment has been rescheduled to 09/13/2019 at 1400. Nothing further was needed.

## 2019-09-11 ENCOUNTER — Ambulatory Visit: Payer: 59

## 2019-09-11 ENCOUNTER — Other Ambulatory Visit: Payer: Self-pay

## 2019-09-12 ENCOUNTER — Ambulatory Visit (INDEPENDENT_AMBULATORY_CARE_PROVIDER_SITE_OTHER): Payer: 59

## 2019-09-12 ENCOUNTER — Other Ambulatory Visit: Payer: Self-pay | Admitting: Obstetrics & Gynecology

## 2019-09-12 DIAGNOSIS — Z780 Asymptomatic menopausal state: Secondary | ICD-10-CM

## 2019-09-12 DIAGNOSIS — M8589 Other specified disorders of bone density and structure, multiple sites: Secondary | ICD-10-CM | POA: Diagnosis not present

## 2019-09-13 ENCOUNTER — Telehealth: Payer: Self-pay | Admitting: *Deleted

## 2019-09-13 ENCOUNTER — Ambulatory Visit (INDEPENDENT_AMBULATORY_CARE_PROVIDER_SITE_OTHER): Payer: 59

## 2019-09-13 ENCOUNTER — Other Ambulatory Visit: Payer: Self-pay

## 2019-09-13 ENCOUNTER — Telehealth: Payer: Self-pay | Admitting: Emergency Medicine

## 2019-09-13 DIAGNOSIS — J4550 Severe persistent asthma, uncomplicated: Secondary | ICD-10-CM | POA: Diagnosis not present

## 2019-09-13 MED ORDER — OMALIZUMAB 150 MG ~~LOC~~ SOLR: 225.0000 mg | Freq: Once | SUBCUTANEOUS | Status: DC

## 2019-09-13 MED ORDER — OMALIZUMAB 150 MG ~~LOC~~ SOLR
150.0000 mg | Freq: Once | SUBCUTANEOUS | Status: AC
  Administered 2019-09-13: 150 mg via SUBCUTANEOUS

## 2019-09-13 MED ORDER — OMALIZUMAB 150 MG ~~LOC~~ SOLR
75.0000 mg | Freq: Once | SUBCUTANEOUS | Status: AC
  Administered 2019-09-13: 75 mg via SUBCUTANEOUS

## 2019-09-13 NOTE — Telephone Encounter (Signed)
Patient came into office today for Xolair injection. Patient stated when she started Xolair injections she gave Tammy S, a mastercard to be used to cover injection office appointment cost.  Patient said she discovered mastercard has never been used, and her spouse has been paying the injection office bill. Patient stated she would like to speak with someone in billing to explain and get possible answer about office injection bill.  Patient contact # [phone]  Message routed to [employer]

## 2019-09-13 NOTE — Progress Notes (Signed)
Have you been hospitalized within the last 10 days?  No Do you have a fever?  No Do you have a cough?  No Do you have a headache or sore throat? No Do you have your Epi Pen visible and is it within date?  Yes 

## 2019-09-19 ENCOUNTER — Telehealth: Payer: Self-pay | Admitting: Emergency Medicine

## 2019-09-19 NOTE — Telephone Encounter (Signed)
Xolair Order: # vials: 2 Ordered date: 09/19/2019 Expected date of arrival: 09/24/2019 Ordered by: Desmond Dike, [place]  Specialty Pharmacy: [facility]

## 2019-09-21 ENCOUNTER — Ambulatory Visit: Payer: 59

## 2019-09-23 NOTE — Telephone Encounter (Signed)
Lattie Haw,  I have no idea what is going on with this.  I don't know anything about her giving Tammy S. A mastercard to "keep on file" and we would not do that anyway.  We may need to talk to Tammy D. About this as I am not sure what to do about it.

## 2019-09-24 NOTE — Telephone Encounter (Signed)
Xolair Received: # Vials: 2 Medication arrival date: 09/24/2019 Lot #: [id number] Exp date: 02/2023 Received by: Desmond Dike, [place]

## 2019-09-25 NOTE — Telephone Encounter (Signed)
Message routed to Rudy.

## 2019-09-25 NOTE — Telephone Encounter (Signed)
Kathryn Griffin spoke with pt on the phone. Pt is going to bring a copy of the bill she has received so we may further investigate.

## 2019-10-04 ENCOUNTER — Ambulatory Visit: Payer: 59

## 2019-10-08 ENCOUNTER — Other Ambulatory Visit: Payer: Self-pay

## 2019-10-08 ENCOUNTER — Ambulatory Visit (INDEPENDENT_AMBULATORY_CARE_PROVIDER_SITE_OTHER): Payer: 59

## 2019-10-08 DIAGNOSIS — J4550 Severe persistent asthma, uncomplicated: Secondary | ICD-10-CM | POA: Diagnosis not present

## 2019-10-08 MED ORDER — OMALIZUMAB 150 MG ~~LOC~~ SOLR
225.0000 mg | SUBCUTANEOUS | Status: DC
  Administered 2019-10-08: 15:00:00 225 mg via SUBCUTANEOUS

## 2019-10-08 NOTE — Progress Notes (Signed)
All questions were answered by the patient before medication was administered. Have you been hospitalized in the last 10 days? No Do you have a fever? No Do you have a cough? No Do you have a headache or sore throat? No  

## 2019-10-22 ENCOUNTER — Telehealth: Payer: Self-pay | Admitting: Emergency Medicine

## 2019-10-22 NOTE — Telephone Encounter (Signed)
Contacted [facility] to set up shipment. Was advised that the pt has a high copay and they will need to contact her before shipping. We have set the shipment up tentatively for 10/25/2019.

## 2019-10-25 NOTE — Telephone Encounter (Signed)
Xolair Received: # Vials: 2 Medication arrival date: 10/25/2019 Lot #: [id number] Exp date: 03/2023 Received by: Desmond Dike, [place]

## 2019-10-29 ENCOUNTER — Ambulatory Visit: Payer: 59

## 2019-10-30 ENCOUNTER — Ambulatory Visit (INDEPENDENT_AMBULATORY_CARE_PROVIDER_SITE_OTHER): Payer: 59

## 2019-10-30 ENCOUNTER — Other Ambulatory Visit: Payer: Self-pay

## 2019-10-30 DIAGNOSIS — J4550 Severe persistent asthma, uncomplicated: Secondary | ICD-10-CM | POA: Diagnosis not present

## 2019-10-30 MED ORDER — OMALIZUMAB 150 MG ~~LOC~~ SOLR
225.0000 mg | Freq: Once | SUBCUTANEOUS | Status: AC
  Administered 2019-10-30: 225 mg via SUBCUTANEOUS

## 2019-10-30 NOTE — Progress Notes (Signed)
Have you been hospitalized within the last 10 days?  No Do you have a fever?  No Do you have a cough?  No Do you have a headache or sore throat? No Do you have your Epi Pen visible and is it within date?  Yes 

## 2019-11-11 ENCOUNTER — Telehealth: Payer: Self-pay | Admitting: Emergency Medicine

## 2019-11-11 NOTE — Telephone Encounter (Signed)
Xolair Order: # vials: 2 Ordered date: 11/11/19 Expected date of arrival: 11/14/19 Ordered by: [place]: [facility]

## 2019-11-14 ENCOUNTER — Telehealth: Payer: Self-pay | Admitting: Emergency Medicine

## 2019-11-14 NOTE — Telephone Encounter (Signed)
Xolair Order: # vials: 2 Ordered date: 11/14/2019 Expected date of arrival: 11/19/2019 Ordered by: Desmond Dike, [place]  Specialty Pharmacy: [facility]

## 2019-11-19 NOTE — Telephone Encounter (Signed)
Xolair Received: # Vials: 2 Medication arrival date: 11/19/2019 Lot #: [id number] Exp date: 03/2023 Received by: Desmond Dike, [place]

## 2019-11-20 ENCOUNTER — Ambulatory Visit (INDEPENDENT_AMBULATORY_CARE_PROVIDER_SITE_OTHER): Payer: 59

## 2019-11-20 ENCOUNTER — Other Ambulatory Visit: Payer: Self-pay

## 2019-11-20 DIAGNOSIS — J4550 Severe persistent asthma, uncomplicated: Secondary | ICD-10-CM | POA: Diagnosis not present

## 2019-11-20 MED ORDER — OMALIZUMAB 150 MG ~~LOC~~ SOLR
225.0000 mg | SUBCUTANEOUS | Status: DC
  Administered 2019-11-20: 225 mg via SUBCUTANEOUS

## 2019-11-20 NOTE — Progress Notes (Signed)
All questions were answered by the patient before medication was administered. Have you been hospitalized in the last 10 days? No Do you have a fever? No Do you have a cough? No Do you have a headache or sore throat? No  

## 2019-12-02 ENCOUNTER — Telehealth: Payer: Self-pay | Admitting: Emergency Medicine

## 2019-12-02 NOTE — Telephone Encounter (Addendum)
Called [facility] to set up shipment. Was advised that they are having to do a benefits verification since this is billed under her medical plan. They need [place] to call back in 24 hours to set up shipment.

## 2019-12-05 ENCOUNTER — Telehealth: Payer: Self-pay | Admitting: Emergency Medicine

## 2019-12-06 NOTE — Telephone Encounter (Signed)
See telephone encounter (12/02/2019)

## 2019-12-06 NOTE — Telephone Encounter (Signed)
ATC [facility] to schedule shipment. Was on hold for over 10 minutes. Will try back later this afternoon.

## 2019-12-06 NOTE — Telephone Encounter (Signed)
Xolair Order: # vials: 2 Ordered date: 12/06/2019 Expected date of arrival: 12/10/2019 Ordered by: Desmond Dike, [place]  Specialty Pharmacy: [facility]

## 2019-12-10 NOTE — Telephone Encounter (Signed)
Xolair Received: # Vials: 2 Medication arrival date: 12/10/2019 Lot #: [id number] Exp date: 06/2023 Received by: Desmond Dike, [place]

## 2019-12-11 ENCOUNTER — Other Ambulatory Visit: Payer: Self-pay

## 2019-12-11 ENCOUNTER — Ambulatory Visit (INDEPENDENT_AMBULATORY_CARE_PROVIDER_SITE_OTHER): Payer: 59

## 2019-12-11 DIAGNOSIS — J4550 Severe persistent asthma, uncomplicated: Secondary | ICD-10-CM

## 2019-12-11 MED ORDER — OMALIZUMAB 150 MG ~~LOC~~ SOLR
225.0000 mg | Freq: Once | SUBCUTANEOUS | Status: AC
  Administered 2019-12-11: 225 mg via SUBCUTANEOUS

## 2019-12-11 NOTE — Progress Notes (Signed)
Have you been hospitalized within the last 10 days?  No Do you have a fever?  No Do you have a cough?  No Do you have a headache or sore throat? No Do you have your Epi Pen visible and is it within date?  Yes 

## 2019-12-31 ENCOUNTER — Telehealth: Payer: Self-pay | Admitting: Emergency Medicine

## 2019-12-31 NOTE — Telephone Encounter (Signed)
Xolair Order: # vials: 2 Ordered date: 12/31/19 Expected date of arrival: 01/02/20 Ordered by: [place]: [facility]

## 2020-01-02 NOTE — Telephone Encounter (Signed)
Xolair Received: # Vials: 2 Medication arrival date: 01/02/20 Lot #: [id number] Exp date: 07/27/2023 Received by: [place] Dally

## 2020-01-02 NOTE — Telephone Encounter (Signed)
error 

## 2020-01-06 ENCOUNTER — Other Ambulatory Visit: Payer: Self-pay

## 2020-01-06 ENCOUNTER — Ambulatory Visit (INDEPENDENT_AMBULATORY_CARE_PROVIDER_SITE_OTHER): Payer: 59

## 2020-01-06 DIAGNOSIS — J4550 Severe persistent asthma, uncomplicated: Secondary | ICD-10-CM

## 2020-01-06 MED ORDER — OMALIZUMAB 150 MG ~~LOC~~ SOLR
225.0000 mg | SUBCUTANEOUS | Status: DC
  Administered 2020-01-06: 225 mg via SUBCUTANEOUS

## 2020-01-06 NOTE — Progress Notes (Signed)
All questions were answered by the patient before medication was administered. Have you been hospitalized in the last 10 days? No Do you have a fever? No Do you have a cough? No Do you have a headache or sore throat? No  

## 2020-01-08 ENCOUNTER — Other Ambulatory Visit: Payer: Self-pay | Admitting: Obstetrics & Gynecology

## 2020-01-08 DIAGNOSIS — Z1231 Encounter for screening mammogram for malignant neoplasm of breast: Secondary | ICD-10-CM

## 2020-01-17 ENCOUNTER — Other Ambulatory Visit: Payer: Self-pay | Admitting: *Deleted

## 2020-01-17 MED ORDER — OMALIZUMAB 150 MG ~~LOC~~ SOLR: 225.0000 mg | SUBCUTANEOUS | 11 refills | Status: DC

## 2020-01-20 ENCOUNTER — Telehealth: Payer: Self-pay | Admitting: Emergency Medicine

## 2020-01-20 NOTE — Telephone Encounter (Signed)
Xolair Order: # vials: 2 Ordered date: 01/20/20 Expected date of arrival: 01/22/20 Ordered by: [place]: [facility]  Order #[id number]

## 2020-01-22 ENCOUNTER — Ambulatory Visit: Payer: 59

## 2020-01-22 NOTE — Telephone Encounter (Signed)
Xolair Received: # Vials: 2 Medication arrival date: 01/22/2020 Lot #: [id number] Exp date: 08/2023 Received by: Desmond Dike, [place]

## 2020-01-28 ENCOUNTER — Other Ambulatory Visit: Payer: Self-pay

## 2020-01-28 ENCOUNTER — Ambulatory Visit (INDEPENDENT_AMBULATORY_CARE_PROVIDER_SITE_OTHER): Payer: 59

## 2020-01-28 DIAGNOSIS — J4550 Severe persistent asthma, uncomplicated: Secondary | ICD-10-CM

## 2020-01-28 MED ORDER — OMALIZUMAB 150 MG ~~LOC~~ SOLR
300.0000 mg | SUBCUTANEOUS | Status: DC
  Administered 2020-01-28: 300 mg via SUBCUTANEOUS

## 2020-01-28 NOTE — Progress Notes (Signed)
All questions were answered by the patient before medication was administered. Have you been hospitalized in the last 10 days? No Do you have a fever? No Do you have a cough? No Do you have a headache or sore throat? No  

## 2020-01-29 ENCOUNTER — Ambulatory Visit
Admission: RE | Admit: 2020-01-29 | Discharge: 2020-01-29 | Disposition: A | Discharge status: Home / Self Care | Payer: 59 | Source: Ambulatory Visit | Attending: Obstetrics & Gynecology | Admitting: Obstetrics & Gynecology

## 2020-01-29 DIAGNOSIS — Z1231 Encounter for screening mammogram for malignant neoplasm of breast: Secondary | ICD-10-CM

## 2020-02-10 ENCOUNTER — Telehealth: Payer: Self-pay | Admitting: Emergency Medicine

## 2020-02-10 NOTE — Telephone Encounter (Signed)
Xolair Order: # vials: 2 Ordered date: 02/10/20 Expected date of arrival: 02/13/20 Ordered by: [place]: [facility]

## 2020-02-11 ENCOUNTER — Telehealth: Payer: Self-pay | Admitting: Emergency Medicine

## 2020-02-11 NOTE — Telephone Encounter (Signed)
Since the medication is topical instead of systemic I do not believe it is compromising her immune system.  Likewise vitiligo itself should not be putting her at increased risk.  Based on this I do not think she needs to pursue a booster at this time.  At some point a booster will likely be recommended for providers and patients with competent immune systems.  We will need to get the booster then

## 2020-02-11 NOTE — Telephone Encounter (Signed)
Contacted patient and read her Dr. Agustina Caroli recommendations. Patient verbalized understanding.

## 2020-02-11 NOTE — Telephone Encounter (Signed)
Called and spoke with patient , she has a diagnosis on vitiligo and is on creams -Elidel-  She wants to know if this autoimmune disease makes her a candidate for the vaccine booster.  Dr. Lamonte Sakai please advise

## 2020-02-13 ENCOUNTER — Other Ambulatory Visit: Payer: Self-pay | Admitting: Emergency Medicine

## 2020-02-18 ENCOUNTER — Ambulatory Visit: Payer: 59

## 2020-02-19 ENCOUNTER — Ambulatory Visit: Payer: 59

## 2020-02-19 ENCOUNTER — Telehealth: Payer: Self-pay | Admitting: Emergency Medicine

## 2020-02-19 NOTE — Telephone Encounter (Signed)
Xolair Order: # vials: 2 Ordered date: 02/19/20 Expected date of arrival: 02/21/20 Ordered by: [place]: Lennette Bihari

## 2020-02-20 NOTE — Telephone Encounter (Signed)
Encounter created in error. Will sign off.  

## 2020-02-21 ENCOUNTER — Other Ambulatory Visit: Payer: Self-pay

## 2020-02-21 ENCOUNTER — Ambulatory Visit: Payer: 59

## 2020-02-21 DIAGNOSIS — J4550 Severe persistent asthma, uncomplicated: Secondary | ICD-10-CM

## 2020-02-21 MED ORDER — OMALIZUMAB 150 MG ~~LOC~~ SOLR
225.0000 mg | Freq: Once | SUBCUTANEOUS | Status: AC
  Administered 2020-02-21: 225 mg via SUBCUTANEOUS

## 2020-02-21 NOTE — Telephone Encounter (Signed)
Xolair Received: # Vials: 2 Medication arrival date: 02/21/20 Lot #: [id number] Exp date: 09/25/2023 Received by: Elliot Dally

## 2020-02-21 NOTE — Progress Notes (Signed)
Have you been hospitalized within the last 10 days?  No Do you have a fever?  No Do you have a cough?  No Do you have a headache or sore throat? No Do you have your Epi Pen visible and is it within date?  Yes 

## 2020-02-24 ENCOUNTER — Other Ambulatory Visit: Payer: Self-pay | Admitting: Obstetrics & Gynecology

## 2020-03-10 ENCOUNTER — Other Ambulatory Visit: Payer: 59

## 2020-03-10 ENCOUNTER — Telehealth: Payer: Self-pay | Admitting: Emergency Medicine

## 2020-03-10 NOTE — Telephone Encounter (Signed)
Xolair Order: # vials: 2 Ordered date: 03/10/20 Expected date of arrival: 03/12/20 Ordered by: [place]: [facility]

## 2020-03-12 NOTE — Telephone Encounter (Signed)
Xolair Received: # Vials: 2 Medication arrival date: 03/12/20 Lot #: [id number] Exp date: 09/25/2023 Received by: Elliot Dally

## 2020-03-16 ENCOUNTER — Ambulatory Visit (INDEPENDENT_AMBULATORY_CARE_PROVIDER_SITE_OTHER): Payer: 59

## 2020-03-16 ENCOUNTER — Other Ambulatory Visit: Payer: Self-pay

## 2020-03-16 DIAGNOSIS — J4550 Severe persistent asthma, uncomplicated: Secondary | ICD-10-CM

## 2020-03-16 MED ORDER — OMALIZUMAB 150 MG/ML ~~LOC~~ SOSY
225.0000 mg | PREFILLED_SYRINGE | Freq: Once | SUBCUTANEOUS | Status: AC
  Administered 2020-03-16: 225 mg via SUBCUTANEOUS

## 2020-03-16 NOTE — Progress Notes (Signed)
Have you been hospitalized within the last 10 days?  No Do you have a fever?  No Do you have a cough?  No Do you have a headache or sore throat? No Do you have your Epi Pen visible and is it within date?  Yes 

## 2020-03-23 ENCOUNTER — Other Ambulatory Visit: Payer: Self-pay | Admitting: Emergency Medicine

## 2020-03-23 NOTE — Telephone Encounter (Signed)
Contacted patient and made aware she overdue for appt. 1 refill approved until seen called in. Nothing further needed.

## 2020-03-24 ENCOUNTER — Ambulatory Visit (INDEPENDENT_AMBULATORY_CARE_PROVIDER_SITE_OTHER): Payer: 59 | Admitting: Emergency Medicine

## 2020-03-24 ENCOUNTER — Encounter: Payer: Self-pay | Admitting: Emergency Medicine

## 2020-03-24 ENCOUNTER — Other Ambulatory Visit: Payer: Self-pay

## 2020-03-24 VITALS — BP 114/68 | HR 83 | Temp 97.9°F | Ht 67.0 in | Wt 154.2 lb

## 2020-03-24 DIAGNOSIS — J479 Bronchiectasis, uncomplicated: Secondary | ICD-10-CM

## 2020-03-24 DIAGNOSIS — J4550 Severe persistent asthma, uncomplicated: Secondary | ICD-10-CM

## 2020-03-24 DIAGNOSIS — Z23 Encounter for immunization: Secondary | ICD-10-CM

## 2020-03-24 DIAGNOSIS — J309 Allergic rhinitis, unspecified: Secondary | ICD-10-CM

## 2020-03-24 MED ORDER — FLOVENT HFA 220 MCG/ACT IN AERO: 2.0000 | INHALATION_SPRAY | Freq: Two times a day (BID) | RESPIRATORY_TRACT | 5 refills | Status: DC

## 2020-03-24 MED ORDER — FLOVENT HFA 220 MCG/ACT IN AERO
2.0000 | INHALATION_SPRAY | Freq: Two times a day (BID) | RESPIRATORY_TRACT | 5 refills | Status: DC
Start: 2020-03-24 — End: 2020-09-21

## 2020-03-24 NOTE — Addendum Note (Signed)
Addended by: Gavin Potters R on: 03/24/2020 04:48 PM   Modules accepted: Orders

## 2020-03-24 NOTE — Assessment & Plan Note (Signed)
Continue to use your Allegra and Flonase as you have been doing them.

## 2020-03-24 NOTE — Assessment & Plan Note (Addendum)
With suspected ABPA based on her culture data.  Plan to consider weaning Xolair at some point going forward, possibly even beginning of 2022.  Continue same regimen as below.  Her Qvar was changed to [employer] for insurance reasons.  Flu shot today.  Please continue Flovent 2 puffs twice a day.  Rinse and gargle after using.  We will refill this for you today. Keep your albuterol available in case she needed for shortness of breath, chest tightness, wheezing. Continue Xolair every 3 weeks for now.  We may decide to modify this at the beginning of 2022, space out your treatments and work on possibly coming off Xolair at some point. Follow with Dr. Lamonte Sakai in December after your CT is done so that we can review together.

## 2020-03-24 NOTE — Assessment & Plan Note (Signed)
Use your hypertonic saline nebulizer treatments and your flutter valve as needed to help with mucus clearance. We will repeat your high-resolution CT scan of the chest without contrast to follow your bronchiectasis and ABPA in December 2021.

## 2020-03-24 NOTE — Addendum Note (Signed)
Addended by: Gavin Potters R on: 03/24/2020 05:03 PM   Modules accepted: Orders

## 2020-03-24 NOTE — Patient Instructions (Addendum)
Please continue Flovent 2 puffs twice a day.  Rinse and gargle after using.  We will refill this for you today. Keep your albuterol available in case she needed for shortness of breath, chest tightness, wheezing. Continue Xolair every 3 weeks for now.  We may decide to modify this at the beginning of 2022, space out your treatments and work on possibly coming off Xolair at some point. Continue to use your Allegra and Flonase as you have been doing them. Use your hypertonic saline nebulizer treatments and your flutter valve as needed to help with mucus clearance. We will repeat your high-resolution CT scan of the chest without contrast to follow your bronchiectasis and ABPA in December 2021. Follow with Dr. Lamonte Sakai in December after your CT is done so that we can review together.

## 2020-03-24 NOTE — Progress Notes (Signed)
Subjective:    Patient ID: Lorilei Horan, female    DOB: Aug 31, 1955, 64 y.o.   MRN: [id number]  HPI Ms. Harper is 55 and has been followed in our office by Dr. Lake Bells, last seen by T. Parrett in September 2020.  She is a never smoker who has a history of severe persistent asthma, possible ABPA based on bronchoscopy cultures in 04/2013 that grew Aspergillus species although IgE at the time was 156.  There was an elevated eosinophil count on at least 1 sample in 2014.  She received itraconazole but subsequent sputum cultures have also been positive for Aspergillus.  She started Xolair every 3 weeks in 08/2017.  She is currently managed on Qvar 2 puffs twice daily, has albuterol which she rarely needs. She has cough, rarely productive of plugs - most recently last week, was gray, thick. She is active, walks every day. No wheeze.  Currently using flonase prn, allegra qd. Off singulair.   ROV 03/24/20 --follow-up visit for 64 year old woman with history of severe persistent asthma, eosinophilic phenotype, possible ABPA.  She has some mild bronchiectasis by CT chest 06/26/2017.  Has been managed on Xolair, Qvar >. Changed to flovent in March of this year.  She returns today reporting that she has been well. In March she had some increased cough and saw some plugs. She did her hypertonic saline nebs, flutter, for several days and it passed. She did not have to have pred or abx. Never needs albuterol. Takes allegra most days, flonase prn.    Review of Systems As per HPI     Objective:   Physical Exam Vitals:   03/24/20 1403  BP: 114/68  Pulse: 83  Temp: 97.9 F (36.6 C)  TempSrc: Temporal  SpO2: 99%  Weight: 154 lb 3.2 oz (69.9 kg)  Height: 5\' 7"  (1.702 m)   Gen: Pleasant, well-nourished, in no distress,  normal affect  ENT: No lesions,  mouth clear,  oropharynx clear, no postnasal drip  Neck: No JVD, no stridor  Lungs: No use of accessory muscles, no crackles or wheezing on normal  respiration, no wheeze on forced expiration  Cardiovascular: RRR, heart sounds normal, no murmur or gallops, no peripheral edema  Musculoskeletal: No deformities, no cyanosis or clubbing  Neuro: alert, awake, non focal  Skin: Warm, no lesions or rash      Assessment & Plan:  Asthma With suspected ABPA based on her culture data.  Plan to consider weaning Xolair at some point going forward, possibly even beginning of 2022.  Continue same regimen as below.  Her Qvar was changed to [employer] for insurance reasons.  Flu shot today.  Please continue Flovent 2 puffs twice a day.  Rinse and gargle after using.  We will refill this for you today. Keep your albuterol available in case she needed for shortness of breath, chest tightness, wheezing. Continue Xolair every 3 weeks for now.  We may decide to modify this at the beginning of 2022, space out your treatments and work on possibly coming off Xolair at some point. Follow with Dr. Lamonte Sakai in December after your CT is done so that we can review together.  Bronchiectasis without complication ([place]) Use your hypertonic saline nebulizer treatments and your flutter valve as needed to help with mucus clearance. We will repeat your high-resolution CT scan of the chest without contrast to follow your bronchiectasis and ABPA in December 2021.   Allergic rhinitis Continue to use your Allegra and Flonase as you have  been doing them.  Baltazar Apo, MD, PhD 03/24/2020, 2:36 PM [facility] [phone] or if no answer [phone]

## 2020-03-30 ENCOUNTER — Telehealth: Payer: Self-pay | Admitting: Emergency Medicine

## 2020-03-30 NOTE — Telephone Encounter (Signed)
Xolair Order: # vials: 2 Ordered date: 03/30/20 Expected date of arrival: 04/01/20 Ordered by: [place]: [facility]

## 2020-04-01 NOTE — Telephone Encounter (Signed)
Xolair Received: # Vials: 2 Medication arrival date: 04/01/20 Lot #: [id number] Exp date: 09/25/2023 Received by: Elliot Dally

## 2020-04-06 ENCOUNTER — Ambulatory Visit (INDEPENDENT_AMBULATORY_CARE_PROVIDER_SITE_OTHER): Payer: 59

## 2020-04-06 ENCOUNTER — Other Ambulatory Visit: Payer: Self-pay

## 2020-04-06 DIAGNOSIS — J4550 Severe persistent asthma, uncomplicated: Secondary | ICD-10-CM

## 2020-04-06 MED ORDER — OMALIZUMAB 150 MG ~~LOC~~ SOLR
225.0000 mg | Freq: Once | SUBCUTANEOUS | Status: AC
  Administered 2020-04-06: 225 mg via SUBCUTANEOUS

## 2020-04-06 NOTE — Progress Notes (Signed)
Have you been hospitalized within the last 10 days?  No Do you have a fever?  No Do you have a cough?  No Do you have a headache or sore throat? No Do you have your Epi Pen visible and is it within date?  Yes 

## 2020-04-20 ENCOUNTER — Telehealth: Payer: Self-pay | Admitting: Emergency Medicine

## 2020-04-20 NOTE — Telephone Encounter (Signed)
Xolair Order: # vials: 2 Ordered date: 04/20/20 Expected date of arrival: 04/22/20 Ordered by: [place]: Claria Dice

## 2020-04-21 NOTE — Telephone Encounter (Signed)
Xolair is scheduled 04/22/20 shipment.

## 2020-04-21 NOTE — Telephone Encounter (Signed)
[facility] calling to set up delivery of Xolair. [facility] can be reached at [phone]

## 2020-04-22 NOTE — Telephone Encounter (Signed)
Called and spoke with [facility].  Xolair scheduled scheduled for 04/24/20 shipment.

## 2020-04-24 ENCOUNTER — Telehealth: Payer: Self-pay | Admitting: Emergency Medicine

## 2020-04-24 NOTE — Telephone Encounter (Signed)
Xolair Received: # Vials: 2 Medication arrival date: 04/24/20 Lot #: [id number] Exp date: 3/31/202 Received by: Elliot Dally

## 2020-04-27 ENCOUNTER — Ambulatory Visit: Payer: 59

## 2020-04-27 NOTE — Telephone Encounter (Signed)
Patient rescheduled 04/28/20 at 1415.

## 2020-04-28 ENCOUNTER — Other Ambulatory Visit: Payer: Self-pay

## 2020-04-28 ENCOUNTER — Ambulatory Visit (INDEPENDENT_AMBULATORY_CARE_PROVIDER_SITE_OTHER): Payer: 59

## 2020-04-28 DIAGNOSIS — J4550 Severe persistent asthma, uncomplicated: Secondary | ICD-10-CM | POA: Diagnosis not present

## 2020-04-28 MED ORDER — OMALIZUMAB 150 MG ~~LOC~~ SOLR
225.0000 mg | Freq: Once | SUBCUTANEOUS | Status: AC
  Administered 2020-04-28: 225 mg via SUBCUTANEOUS

## 2020-04-28 NOTE — Progress Notes (Signed)
Have you been hospitalized within the last 10 days?  No Do you have a fever?  No Do you have a cough?  No Do you have a headache or sore throat? No Do you have your Epi Pen visible and is it within date?  Yes 

## 2020-05-11 ENCOUNTER — Telehealth: Payer: Self-pay | Admitting: Emergency Medicine

## 2020-05-11 NOTE — Telephone Encounter (Signed)
Xolair Order: # vials: 2 Ordered date: 05/11/20 Expected date of arrival: 05/13/20 Ordered by: [place]: Lennette Bihari

## 2020-05-13 NOTE — Telephone Encounter (Signed)
Xolair Received: # Vials: 2 Medication arrival date: 05/13/20 Lot #: [id number] Exp date: 03/27/2023 Received by: Elliot Dally

## 2020-05-19 ENCOUNTER — Other Ambulatory Visit: Payer: Self-pay

## 2020-05-19 ENCOUNTER — Ambulatory Visit (INDEPENDENT_AMBULATORY_CARE_PROVIDER_SITE_OTHER): Payer: 59

## 2020-05-19 DIAGNOSIS — J4550 Severe persistent asthma, uncomplicated: Secondary | ICD-10-CM

## 2020-05-19 MED ORDER — OMALIZUMAB 150 MG ~~LOC~~ SOLR
225.0000 mg | Freq: Once | SUBCUTANEOUS | Status: AC
  Administered 2020-05-19: 225 mg via SUBCUTANEOUS

## 2020-05-19 NOTE — Progress Notes (Signed)
Have you been hospitalized within the last 10 days?  No Do you have a fever?  No Do you have a cough?  No Do you have a headache or sore throat? No Do you have your Epi Pen visible and is it within date?  Yes 

## 2020-06-01 ENCOUNTER — Telehealth: Payer: Self-pay | Admitting: Emergency Medicine

## 2020-06-01 NOTE — Telephone Encounter (Signed)
Called [facility] to set up [place] delivery.  Xolair PA approved and pharmacy is working with medical benefits.  I was told [facility] will call later this week to schedule office delivery.  Next injection date 06/09/20

## 2020-06-01 NOTE — Telephone Encounter (Signed)
Called [facility] to place Xolair shipment. I was told Xolair PA expires 06/02/20 and they are unable to ship Xolair to office.  Will route message to [facility]

## 2020-06-01 NOTE — Telephone Encounter (Signed)
Called [facility], authorization for patient. Will update once we receive a response.  Received notification from [facility] regarding a prior authorization for [facility]- [id number]. Authorization has been APPROVED from 06/01/20 to 06/01/21.   Authorization # [id number] Phone# [phone]   Called [facility] and advised. They will verify authorization and schedule shipment to deliver asap.

## 2020-06-02 NOTE — Telephone Encounter (Signed)
Xolair Order: # vials: 2 Ordered date: 06/02/20 Expected date of arrival: 06/04/20 Ordered by: [place],[facility]: Claria Dice

## 2020-06-04 NOTE — Telephone Encounter (Signed)
Xolair Received: # Vials: 2 Medication arrival date: 06/04/20 Lot #: [id number] Exp date: 01/25/2024 Received by: Elliot Dally

## 2020-06-08 ENCOUNTER — Other Ambulatory Visit: Payer: Self-pay

## 2020-06-08 ENCOUNTER — Ambulatory Visit (INDEPENDENT_AMBULATORY_CARE_PROVIDER_SITE_OTHER)
Admission: RE | Admit: 2020-06-08 | Discharge: 2020-06-08 | Disposition: A | Discharge status: Home / Self Care | Payer: 59 | Source: Ambulatory Visit | Attending: Emergency Medicine | Admitting: Emergency Medicine

## 2020-06-08 DIAGNOSIS — J479 Bronchiectasis, uncomplicated: Secondary | ICD-10-CM | POA: Diagnosis not present

## 2020-06-09 ENCOUNTER — Ambulatory Visit (INDEPENDENT_AMBULATORY_CARE_PROVIDER_SITE_OTHER): Payer: 59

## 2020-06-09 DIAGNOSIS — J4550 Severe persistent asthma, uncomplicated: Secondary | ICD-10-CM

## 2020-06-09 MED ORDER — OMALIZUMAB 150 MG ~~LOC~~ SOLR
225.0000 mg | Freq: Once | SUBCUTANEOUS | Status: AC
  Administered 2020-06-09: 225 mg via SUBCUTANEOUS

## 2020-06-09 NOTE — Progress Notes (Signed)
Have you been hospitalized within the last 10 days?  No Do you have a fever?  No Do you have a cough?  No Do you have a headache or sore throat? No  

## 2020-06-11 ENCOUNTER — Telehealth: Payer: Self-pay | Admitting: Emergency Medicine

## 2020-06-11 NOTE — Telephone Encounter (Signed)
Would like CT chest results done on 06/08/20

## 2020-06-12 NOTE — Telephone Encounter (Signed)
I called and spoke with the pt and notified of results per Perry  She verbalized understanding  Nothing further needed

## 2020-06-12 NOTE — Telephone Encounter (Signed)
Please let her know that her CT shows stable mild bronchiectasis (airway scar), unchanged from prior. There are some areas of very subtle inflammation that were not seen on the prior scan in 2018. There is no clear evidence for active infection or pneumonia, bronchitis. This is good news. we can review films in full detail at [facility] [place]

## 2020-06-15 ENCOUNTER — Telehealth: Payer: Self-pay | Admitting: Emergency Medicine

## 2020-06-15 NOTE — Telephone Encounter (Signed)
Xolair Order: # vials: 2 Ordered date: 06/15/20 Expected date of arrival: 06/23/20 Ordered by: [place]: [facility]  Being reviewed through insurance 06/18/20 for 06/23/20 shipment.

## 2020-06-23 NOTE — Telephone Encounter (Signed)
Xolair Received: # Vials: 2 Medication arrival date: 06/23/20 Lot #: [id number] Exp date: 01/25/2024 Received by: Lovey Newcomer

## 2020-06-30 ENCOUNTER — Other Ambulatory Visit: Payer: Self-pay

## 2020-06-30 ENCOUNTER — Ambulatory Visit (INDEPENDENT_AMBULATORY_CARE_PROVIDER_SITE_OTHER): Payer: 59

## 2020-06-30 DIAGNOSIS — J4550 Severe persistent asthma, uncomplicated: Secondary | ICD-10-CM

## 2020-06-30 MED ORDER — OMALIZUMAB 150 MG ~~LOC~~ SOLR
225.0000 mg | Freq: Once | SUBCUTANEOUS | Status: AC
  Administered 2020-06-30: 225 mg via SUBCUTANEOUS

## 2020-06-30 NOTE — Progress Notes (Signed)
Have you been hospitalized within the last 10 days?  No Do you have a fever?  No Do you have a cough?  No Do you have a headache or sore throat? No Do you have your Epi Pen visible and is it within date?  Yes 

## 2020-07-06 ENCOUNTER — Encounter: Payer: 59 | Admitting: Obstetrics & Gynecology

## 2020-07-10 ENCOUNTER — Telehealth: Payer: Self-pay | Admitting: Emergency Medicine

## 2020-07-10 NOTE — Telephone Encounter (Signed)
Called [facility] to scheduled Xolair delivery.  Patient consent is needed and co pay is needing to be addressed.  Will try to schedule again later next week.

## 2020-07-14 ENCOUNTER — Ambulatory Visit: Payer: 59 | Admitting: Emergency Medicine

## 2020-07-16 NOTE — Telephone Encounter (Signed)
Xolair Order: # vials: 2 Ordered date: 07/16/20 Expected date of arrival: 07/20/20 Ordered by: [place]: Lennette Bihari

## 2020-07-20 NOTE — Telephone Encounter (Signed)
Xolair Received: # Vials: 2 Medication arrival date: 07/20/20 Lot #: [id number] Exp date: 01/25/2024 Received by: Elliot Dally

## 2020-07-21 ENCOUNTER — Other Ambulatory Visit: Payer: Self-pay

## 2020-07-21 ENCOUNTER — Ambulatory Visit (INDEPENDENT_AMBULATORY_CARE_PROVIDER_SITE_OTHER): Payer: 59

## 2020-07-21 DIAGNOSIS — J4550 Severe persistent asthma, uncomplicated: Secondary | ICD-10-CM

## 2020-07-21 MED ORDER — OMALIZUMAB 150 MG ~~LOC~~ SOLR
225.0000 mg | Freq: Once | SUBCUTANEOUS | Status: AC
  Administered 2020-07-21: 225 mg via SUBCUTANEOUS

## 2020-07-21 NOTE — Progress Notes (Signed)
Have you been hospitalized within the last 10 days?  No Do you have a fever?  No Do you have a cough?  No Do you have a headache or sore throat? No  

## 2020-07-27 ENCOUNTER — Ambulatory Visit (INDEPENDENT_AMBULATORY_CARE_PROVIDER_SITE_OTHER): Payer: 59 | Admitting: Emergency Medicine

## 2020-07-27 ENCOUNTER — Other Ambulatory Visit: Payer: Self-pay

## 2020-07-27 ENCOUNTER — Encounter: Payer: Self-pay | Admitting: Emergency Medicine

## 2020-07-27 DIAGNOSIS — J479 Bronchiectasis, uncomplicated: Secondary | ICD-10-CM

## 2020-07-27 DIAGNOSIS — J4550 Severe persistent asthma, uncomplicated: Secondary | ICD-10-CM

## 2020-07-27 LAB — CBC WITH DIFFERENTIAL/PLATELET
Basophils Absolute: 0.1 10*3/uL (ref 0.0–0.1)
Basophils Relative: 2 % (ref 0.0–3.0)
Eosinophils Absolute: 0.1 10*3/uL (ref 0.0–0.7)
Eosinophils Relative: 2.9 % (ref 0.0–5.0)
HCT: 39.8 % (ref 36.0–46.0)
Hemoglobin: 13.3 g/dL (ref 12.0–15.0)
Lymphocytes Relative: 25.3 % (ref 12.0–46.0)
Lymphs Abs: 1.2 10*3/uL (ref 0.7–4.0)
MCHC: 33.5 g/dL (ref 30.0–36.0)
MCV: 91.8 fl (ref 78.0–100.0)
Monocytes Absolute: 0.5 10*3/uL (ref 0.1–1.0)
Monocytes Relative: 11.7 % (ref 3.0–12.0)
Neutro Abs: 2.7 10*3/uL (ref 1.4–7.7)
Neutrophils Relative %: 58.1 % (ref 43.0–77.0)
Platelets: 323 10*3/uL (ref 150.0–400.0)
RBC: 4.33 Mil/uL (ref 3.87–5.11)
RDW: 13.2 % (ref 11.5–15.5)
WBC: 4.7 10*3/uL (ref 4.0–10.5)

## 2020-07-27 NOTE — Patient Instructions (Addendum)
We will plan to repeat your CT chest in December 2022.  Keep your saline nebulizer available to use if needed to help clear mucous.  Keep albuterol nebs available to use 1 treatment if needed for shortness of breath, chest tightness, wheezing.  Continue your Flovent 2 puffs twice a day. Rinse and gargle after using.  Xolair schedule every 3 weeks for now. We may be able to adjust the frequency depending on blood work and how you are feeling.  Blood work today.  Follow with Dr. Lamonte Sakai in 12 months or sooner if you have any problems.

## 2020-07-27 NOTE — Addendum Note (Signed)
Addended by: Suzzanne Cloud E on: 07/27/2020 11:33 AM   Modules accepted: Orders

## 2020-07-27 NOTE — Assessment & Plan Note (Signed)
CT chest with stable bronchiectatic change, some very subtle micronodular disease.  She did have Aspergillus on previous bronchoscopy but no evidence for invasive disease on this CT.  Low risk patient for malignancy.  I think we can follow her CT in December 2022 for interval change.  If she changes clinically then we may decide to image sooner.

## 2020-07-27 NOTE — Assessment & Plan Note (Signed)
With question of ABPA.  Eosinophilic phenotype with an elevated IgE.  She is benefited significantly from [place].  No exacerbations of minimal albuterol use on this and an inhaled steroid.  She is wondering if we may be able to scale back the frequency of the Xolair, currently taking every 3 weeks.  I will check an IgE and eosinophil count today.  We can then discuss possibly spacing out the Xolair further.

## 2020-07-27 NOTE — Progress Notes (Signed)
Subjective:    Patient ID: Kathryn Griffin, female    DOB: June 01, 1956, 65 y.o.   MRN: [id number]  HPI Ms. Lovin is 31 and has been followed in our office by Dr. Lake Bells, last seen by T. Parrett in September 2020.  She is a never smoker who has a history of severe persistent asthma, possible ABPA based on bronchoscopy cultures in 04/2013 that grew Aspergillus species although IgE at the time was 156.  There was an elevated eosinophil count on at least 1 sample in 2014.  She received itraconazole but subsequent sputum cultures have also been positive for Aspergillus.  She started Xolair every 3 weeks in 08/2017.  She is currently managed on Qvar 2 puffs twice daily, has albuterol which she rarely needs. She has cough, rarely productive of plugs - most recently last week, was gray, thick. She is active, walks every day. No wheeze.  Currently using flonase prn, allegra qd. Off singulair.   ROV 03/24/20 --follow-up visit for 65 year old woman with history of severe persistent asthma, eosinophilic phenotype, possible ABPA.  She has some mild bronchiectasis by CT chest 06/26/2017.  Has been managed on Xolair, Qvar >. Changed to flovent in March of this year.  She returns today reporting that she has been well. In March she had some increased cough and saw some plugs. She did her hypertonic saline nebs, flutter, for several days and it passed. She did not have to have pred or abx. Never needs albuterol. Takes allegra most days, flonase prn.   ROV 07/27/20 --follow-up visit for 65 year old woman. She has a history of severe persistent asthma, eosinophilia with question ABPA with some associated mild bronchiectasis by CT chest. We have been managing her on Xolair every 3 weeks. Currently on Flovent. She has not needed pred in over 2 yrs. She is not requiring any albuterol - very rare.   CT chest 06/08/2020 reviewed by me shows no mediastinal or hilar lymphadenopathy, minimal bilateral tubular bronchiectatic  change with some biapical scarring, unchanged. There is a new cluster of centrilobular nodules in the medial right middle lobe, new groundglass opacity inferior right upper lobe 1.1 x 0.8 cm, new 4 mm left basilar pulmonary nodule   Review of Systems As per HPI     Objective:   Physical Exam Vitals:   07/27/20 1052  BP: 118/72  Pulse: 81  Temp: (!) 97.3 F (36.3 C)  SpO2: 100%  Weight: 161 lb 6.4 oz (73.2 kg)  Height: 5' 7.5" (1.715 m)   Gen: Pleasant, well-nourished, in no distress,  normal affect  ENT: No lesions,  mouth clear,  oropharynx clear, no postnasal drip  Neck: No JVD, no stridor  Lungs: No use of accessory muscles, no crackles or wheezing on normal respiration, no wheeze on forced expiration  Cardiovascular: RRR, heart sounds normal, no murmur or gallops, no peripheral edema  Musculoskeletal: No deformities, no cyanosis or clubbing  Neuro: alert, awake, non focal  Skin: Warm, no lesions or rash      Assessment & Plan:  Bronchiectasis without complication (HCC) CT chest with stable bronchiectatic change, some very subtle micronodular disease.  She did have Aspergillus on previous bronchoscopy but no evidence for invasive disease on this CT.  Low risk patient for malignancy.  I think we can follow her CT in December 2022 for interval change.  If she changes clinically then we may decide to image sooner.  Asthma With question of ABPA.  Eosinophilic phenotype with an elevated IgE.  She is benefited significantly from [place].  No exacerbations of minimal albuterol use on this and an inhaled steroid.  She is wondering if we may be able to scale back the frequency of the Xolair, currently taking every 3 weeks.  I will check an IgE and eosinophil count today.  We can then discuss possibly spacing out the Xolair further.  Baltazar Apo, MD, PhD 07/27/2020, 11:27 AM [facility] [phone] or if no answer [phone]

## 2020-07-27 NOTE — Addendum Note (Signed)
Addended by: Gavin Potters R on: 07/27/2020 11:35 AM   Modules accepted: Orders

## 2020-07-28 LAB — IGE: IgE (Immunoglobulin E), Serum: 353 kU/L — ABNORMAL HIGH (ref <=114)

## 2020-08-03 ENCOUNTER — Telehealth: Payer: Self-pay | Admitting: Emergency Medicine

## 2020-08-03 NOTE — Telephone Encounter (Signed)
Called [facility] to set up [place] delivery.  Patient Xolair is currently under medical befits and should be processed within 24-48 hours.  [facility] will call office to schedule delivery after approval. Will follow up later this week to schedule Xolair office delivery.

## 2020-08-05 ENCOUNTER — Telehealth: Payer: Self-pay | Admitting: Emergency Medicine

## 2020-08-05 NOTE — Telephone Encounter (Signed)
I called and spoke with pt and she stated that she is going to keep the same schedule for now since March is usually her worse month.  She will call back when she is ready to change the dosing of her xolair

## 2020-08-05 NOTE — Telephone Encounter (Signed)
Called and spoke with pt and she is aware of results.  She wanted to know if the xolair dosing would change since this is why the labs were repeated.  RB please advise.   If there is to be no change, then no need to call the pt back.  FYI

## 2020-08-05 NOTE — Telephone Encounter (Signed)
Let her know that since her IgE remains elevated, the decision about the schedule of the Xolair will probably depend on her clinical status - how she feels. If she would like to space the Xolair out to every 4 weeks as a trial, I'd be ok with that. We would have to make sure that her symptoms don't flare with a change like that.,

## 2020-08-05 NOTE — Telephone Encounter (Signed)
Spoke with pt who stated she wanted results for IgE and CBC collected 07/27/20. Dr. Lamonte Sakai please advise.

## 2020-08-05 NOTE — Telephone Encounter (Signed)
Please let her know that her eosinophil count is normal, but that her IgE remains elevated at 353 (improved compared with a year ago).

## 2020-08-06 NOTE — Telephone Encounter (Signed)
Xolair Order: # vials: 2 Ordered date: 08/06/20 Expected date of arrival: 08/11/20- morning delivery Ordered by: [place]: [facility]

## 2020-08-11 ENCOUNTER — Ambulatory Visit (INDEPENDENT_AMBULATORY_CARE_PROVIDER_SITE_OTHER): Payer: 59 | Admitting: Obstetrics & Gynecology

## 2020-08-11 ENCOUNTER — Other Ambulatory Visit: Payer: Self-pay

## 2020-08-11 ENCOUNTER — Encounter: Payer: Self-pay | Admitting: Obstetrics & Gynecology

## 2020-08-11 ENCOUNTER — Ambulatory Visit (INDEPENDENT_AMBULATORY_CARE_PROVIDER_SITE_OTHER): Payer: 59

## 2020-08-11 VITALS — BP 118/76 | Ht 67.0 in | Wt 158.0 lb

## 2020-08-11 DIAGNOSIS — Z780 Asymptomatic menopausal state: Secondary | ICD-10-CM | POA: Diagnosis not present

## 2020-08-11 DIAGNOSIS — N952 Postmenopausal atrophic vaginitis: Secondary | ICD-10-CM | POA: Diagnosis not present

## 2020-08-11 DIAGNOSIS — J4550 Severe persistent asthma, uncomplicated: Secondary | ICD-10-CM | POA: Diagnosis not present

## 2020-08-11 DIAGNOSIS — Z01419 Encounter for gynecological examination (general) (routine) without abnormal findings: Secondary | ICD-10-CM

## 2020-08-11 DIAGNOSIS — M8589 Other specified disorders of bone density and structure, multiple sites: Secondary | ICD-10-CM | POA: Diagnosis not present

## 2020-08-11 MED ORDER — OMALIZUMAB 150 MG ~~LOC~~ SOLR
225.0000 mg | Freq: Once | SUBCUTANEOUS | Status: AC
  Administered 2020-08-11: 225 mg via SUBCUTANEOUS

## 2020-08-11 MED ORDER — PREMARIN 0.625 MG/GM VA CREA: TOPICAL_CREAM | VAGINAL | 4 refills | Status: DC

## 2020-08-11 NOTE — Progress Notes (Signed)
Have you been hospitalized within the last 10 days?  No Do you have a fever?  No Do you have a cough?  No Do you have a headache or sore throat? No Do you have your Epi Pen visible and is it within date?  Yes 

## 2020-08-11 NOTE — Progress Notes (Signed)
Kathryn Griffin [birth year] [id number]    History:    65 y.o.  [id number] Married  HQ:[id number] presenting for annual gyn exam   UVO:[id number], well on no systemic HRT. No PMB. No pelvic pain. Using Premarin for vaginal dryness.Rarely sexually active currently because of her back and hip issues.Physical therapy helping her right hip. Able to walk regularly. Body mass index 24.75. Breast normal. Urine and bowel movements normal. Health labs with family physician.   Past medical history,surgical history, family history and social history were all reviewed and documented in the EPIC chart.  Gynecologic History No LMP recorded. Patient is postmenopausal.  Obstetric History OB History  Gravida Para Term Preterm AB Living  4 2     2 2   SAB IAB Ectopic Multiple Live Births  2            # Outcome Date GA Lbr Len/2nd Weight Sex Delivery Anes PTL Lv  4 SAB           3 SAB           2 Para           1 Para              ROS: A ROS was performed and pertinent positives and negatives are included in the history.  GENERAL: No fevers or chills. HEENT: No change in vision, no earache, sore throat or sinus congestion. NECK: No pain or stiffness. CARDIOVASCULAR: No chest pain or pressure. No palpitations. PULMONARY: No shortness of breath, cough or wheeze. GASTROINTESTINAL: No abdominal pain, nausea, vomiting or diarrhea, melena or bright red blood per rectum. GENITOURINARY: No urinary frequency, urgency, hesitancy or dysuria. MUSCULOSKELETAL: No joint or muscle pain, no back pain, no recent trauma. DERMATOLOGIC: No rash, no itching, no lesions. ENDOCRINE: No polyuria, polydipsia, no heat or cold intolerance. No recent change in weight. HEMATOLOGICAL: No anemia or easy bruising or bleeding. NEUROLOGIC: No headache, seizures, numbness, tingling or weakness. PSYCHIATRIC: No depression, no loss of interest in normal activity or change in sleep pattern.      Exam:   BP 118/76   Ht 5\' 7"  (1.702 m)   Wt 158 lb (71.7 kg)   BMI 24.75 kg/m   Body mass index is 24.75 kg/m.  General appearance : Well developed well nourished female. No acute distress HEENT: Eyes: no retinal hemorrhage or exudates,  Neck supple, trachea midline, no carotid bruits, no thyroidmegaly Lungs: Clear to auscultation, no rhonchi or wheezes, or rib retractions  Heart: Regular rate and rhythm, no murmurs or gallops Breast:Examined in sitting and supine position were symmetrical in appearance, no palpable masses or tenderness,  no skin retraction, no nipple inversion, no nipple discharge, no skin discoloration, no axillary or supraclavicular lymphadenopathy Abdomen: no palpable masses or tenderness, no rebound or guarding Extremities: no edema or skin discoloration or tenderness  Pelvic: Vulva: Normal             Vagina: No gross lesions or discharge  Cervix: No gross lesions or discharge  Uterus  AV, normal size, shape and consistency, non-tender and mobile  Adnexa  Without masses or tenderness  Anus: Normal   Assessment/Plan:  65 y.o. female for annual exam   1. Well female exam with routine gynecological exam Normal gynecologic exam in menopause.  No PMB.  Pap Negative 06/2019, no indication to repeat this year.  Breasts normal.  Screening mammo 01/2020.  [place] 2019.  BMI 24.75.  Continue with fitness and healthy  nutrition.  Health labs with Fam MD.  2. Postmenopausal Well on no systemic HRT.  No PMB.  3. Postmenopausal atrophic vaginitis Premarin cream prescription sent to pharmacy.  4. Osteopenia of multiple sites BD showing Osteopenia at many sites.  Continue with Vit D supplements, Ca++ 1.5 g/day and regular weight bearing physical activities.  Will repeat a BD in 08/2021.  Other orders - fluticasone (FLOVENT HFA) 110 MCG/ACT inhaler; Inhale into the lungs 2 (two) times daily. - conjugated estrogens (PREMARIN) vaginal cream; APPLY 1/4 APPLICATOR TWICE  WEEKLY VAGINALLY AND A SMALL AMOUNT ON THE VULVA AS NEEDED  Princess Bruins MD, 2:13 PM 08/11/2020

## 2020-08-24 ENCOUNTER — Telehealth: Payer: Self-pay | Admitting: Emergency Medicine

## 2020-08-24 NOTE — Telephone Encounter (Signed)
Called [facility] to schedule Xolair delivery.  Xolair is under medical review with insurance at this time. I was instructed to call back later this week to schedule delivery.

## 2020-08-26 NOTE — Telephone Encounter (Signed)
Xolair Order: # vials: 2 Ordered date: 08/26/20 Expected date of arrival: 08/28/20 Ordered by: [place]: Lennette Bihari

## 2020-08-28 NOTE — Telephone Encounter (Signed)
Xolair Received: # Vials: 2 Medication arrival date: 08/28/20 Lot #: [id number] Exp date: 02/25/2024 Received by: Elliot Dally

## 2020-09-02 ENCOUNTER — Other Ambulatory Visit: Payer: Self-pay

## 2020-09-02 ENCOUNTER — Telehealth: Payer: Self-pay | Admitting: Emergency Medicine

## 2020-09-02 ENCOUNTER — Ambulatory Visit (INDEPENDENT_AMBULATORY_CARE_PROVIDER_SITE_OTHER): Payer: 59

## 2020-09-02 DIAGNOSIS — J4550 Severe persistent asthma, uncomplicated: Secondary | ICD-10-CM | POA: Diagnosis not present

## 2020-09-02 DIAGNOSIS — J309 Allergic rhinitis, unspecified: Secondary | ICD-10-CM

## 2020-09-02 MED ORDER — OMALIZUMAB 150 MG ~~LOC~~ SOLR
225.0000 mg | Freq: Once | SUBCUTANEOUS | Status: AC
  Administered 2020-09-02: 225 mg via SUBCUTANEOUS

## 2020-09-02 NOTE — Progress Notes (Signed)
Have you been hospitalized within the last 10 days?  No Do you have a fever?  No Do you have a cough?  No Do you have a headache or sore throat? No Do you have your Epi Pen visible and is it within date?  Yes 

## 2020-09-02 NOTE — Telephone Encounter (Signed)
Patient came in to office today for Xolair injection. Patient stated if she is switched to prefilled syringes she would like to do self injection.  Advised Patient pharmacy team is changing Patients from vials to syringes and transitioning to self injection, for those interested.  Message routed to Pharmacy Team

## 2020-09-03 NOTE — Telephone Encounter (Signed)
Patient approved for Xolair syringes. Patient must fill through [place]

## 2020-09-03 NOTE — Telephone Encounter (Signed)
Patient receives Xolair 225mg  every 21 days. Awaiting clarification from Xolair rep to order Xolair PFS samples for next dose. Next dose due 09/23/20  Knox Saliva, PharmD, MPH Clinical Pharmacist (Rheumatology and Pulmonology)

## 2020-09-03 NOTE — Telephone Encounter (Signed)
Received notification from [place] regarding a prior authorization for XOLAIR. Authorization has been APPROVED from 09/02/20 to 09/02/21.   Authorization # [id number] Phone #

## 2020-09-09 MED ORDER — OMALIZUMAB 75 MG/0.5ML ~~LOC~~ SOSY: PREFILLED_SYRINGE | SUBCUTANEOUS | 0 refills | Status: DC

## 2020-09-09 MED ORDER — OMALIZUMAB 150 MG/ML ~~LOC~~ SOSY: PREFILLED_SYRINGE | SUBCUTANEOUS | 0 refills | Status: DC

## 2020-09-09 NOTE — Telephone Encounter (Signed)
Prescription for Xolair 225mg  (#1 150mg  and #1 75mg  pre-filled syringes sent to [place] with note to deliver to the clinic.  Patient advised and is eager to transition to self-administration - advised that she should reach out to [facility] to schedule shipment to clinic.  Xolair Copay card information: ID: [id number] BIN: [id number] PCN: [id number] Group: [id number]  Patient scheduled for next Xolair dose with pharmacy clinic on 09/23/20@ 1:20pm  Knox Saliva, PharmD, MPH Clinical Pharmacist (Rheumatology and Pulmonology)

## 2020-09-21 ENCOUNTER — Other Ambulatory Visit: Payer: Self-pay | Admitting: Emergency Medicine

## 2020-09-21 NOTE — Telephone Encounter (Signed)
Called [facility] to check status of prescriptions.Rep advised that they had rx's on hold due to not being able to reach the patient to obtain consent, they also need to complete their benefits investigation. Advised patient has appointment on 3/30. they will expedite the process. Patient's Xolair copay card is on file.   Will follow up tomorrow morning.

## 2020-09-22 NOTE — Telephone Encounter (Signed)
Called [facility], they have order ready to ship to clinic. Scheduled shipment to deliver to clinic 09/23/20 between 8-11am   Phone# [phone]

## 2020-09-23 ENCOUNTER — Other Ambulatory Visit: Payer: Self-pay

## 2020-09-23 ENCOUNTER — Ambulatory Visit: Payer: 59 | Admitting: Pharmacist

## 2020-09-23 DIAGNOSIS — J4550 Severe persistent asthma, uncomplicated: Secondary | ICD-10-CM

## 2020-09-23 DIAGNOSIS — Z7189 Other specified counseling: Secondary | ICD-10-CM

## 2020-09-23 DIAGNOSIS — J309 Allergic rhinitis, unspecified: Secondary | ICD-10-CM

## 2020-09-23 DIAGNOSIS — Z79899 Other long term (current) drug therapy: Secondary | ICD-10-CM

## 2020-09-23 MED ORDER — OMALIZUMAB 75 MG/0.5ML ~~LOC~~ SOSY: PREFILLED_SYRINGE | SUBCUTANEOUS | 5 refills | Status: DC

## 2020-09-23 MED ORDER — OMALIZUMAB 150 MG/ML ~~LOC~~ SOSY: PREFILLED_SYRINGE | SUBCUTANEOUS | 0 refills | Status: DC

## 2020-09-23 MED ORDER — EPINEPHRINE 0.3 MG/0.3ML IJ SOAJ: 0.3000 mg | INTRAMUSCULAR | 2 refills | Status: AC | PRN

## 2020-09-23 MED ORDER — OMALIZUMAB 150 MG/ML ~~LOC~~ SOSY: PREFILLED_SYRINGE | SUBCUTANEOUS | 5 refills | Status: DC

## 2020-09-23 MED ORDER — OMALIZUMAB 75 MG/0.5ML ~~LOC~~ SOSY: PREFILLED_SYRINGE | SUBCUTANEOUS | 0 refills | Status: DC

## 2020-09-23 NOTE — Addendum Note (Signed)
Addended by: Cassandria Anger on: 09/23/2020 05:22 PM   Modules accepted: Orders

## 2020-09-23 NOTE — Progress Notes (Signed)
HPI Ms. Virella presents today to [facility] to see pharmacy team to transition to self-administration of Xolair pre-filled syringes.  Xolair started 09/25/2017 and has been receiving in the clinic since then.  She receives Xolair 225mg  every 21 days.  Respiratory Medications Current: Floven HFA 254mcg twice daily + fexofenadine 180mg  once daily Tried in past: Qvar (stopped d/t to insurance formulary) Patient reports no known adherence challenges  OBJECTIVE Allergies  Allergen Reactions  . Molds & Smuts Shortness Of Breath  . Dexilant [Dexlansoprazole] Anxiety  . Protonix [Pantoprazole Sodium] Anxiety    Outpatient Encounter Medications as of 09/23/2020  Medication Sig  . albuterol (PROVENTIL) (2.5 MG/3ML) 0.083% nebulizer solution USE 1 VIAL VIA NEBULIZER EVERY 6 HOURS AS NEEDED FOR WHEEZING OR SHORTNESS OF BREATH  . albuterol (VENTOLIN HFA) 108 (90 Base) MCG/ACT inhaler inhale 2 puffs by mouth every 4 hours if needed  . Calcium Citrate (CITRACAL PO) Take 1 tablet by mouth daily at 8 pm.   . Cholecalciferol (VITAMIN D) 2000 units CAPS Take 1 capsule by mouth daily.  Marland Kitchen conjugated estrogens (PREMARIN) vaginal cream APPLY 1/4 APPLICATOR TWICE WEEKLY VAGINALLY AND A SMALL AMOUNT ON THE VULVA AS NEEDED  . famotidine (PEPCID) 20 MG tablet Take 20 mg by mouth as needed.   . fexofenadine (ALLEGRA) 180 MG tablet Take 180 mg by mouth daily.  Marland Kitchen FLOVENT HFA 220 MCG/ACT inhaler INHALE 2 PUFFS INTO THE LUNGS IN THE MORNING AND AT BEDTIME  . fluticasone (FLONASE) 50 MCG/ACT nasal spray Place 2 sprays into both nostrils daily.   . fluticasone (FLOVENT HFA) 110 MCG/ACT inhaler Inhale into the lungs 2 (two) times daily.  . meloxicam (MOBIC) 15 MG tablet Take 1 tablet by mouth daily as needed.   . Multiple Vitamin (MULTIVITAMIN) capsule Take 1 capsule by mouth daily.  Marland Kitchen omalizumab Arvid Right) 150 MG/ML prefilled syringe Inject 150mg  every 21 days IN ADDITION TO one 75mg  syringe. (TOTAL DOSE of 225 mg  every 21 days). Deliver to clinic: [address] [place], [place] [address]  . omalizumab Arvid Right) 75 MG/0.5ML prefilled syringe Inject 75mg  every 21 days IN ADDITION TO one 150mg  syringe. (TOTAL DOSE of 225 mg every 21 days). Deliver to clinic: [address], [place], [place] [address]  . omeprazole (PRILOSEC) 40 MG capsule Take 40 mg by mouth daily.  . pimecrolimus (ELIDEL) 1 % cream Apply 1 application topically 2 (two) times daily.  Marland Kitchen Respiratory Therapy Supplies (FLUTTER) DEVI Use as directed (Patient not taking: Reported on 08/11/2020)  . sodium chloride HYPERTONIC 3 % nebulizer solution Take by nebulization 2 (two) times daily. (Patient not taking: Reported on 08/11/2020)  . Spacer/Aero-Holding Chambers (AEROCHAMBER MV) inhaler Use as instructed (Patient not taking: Reported on 08/11/2020)   Facility-Administered Encounter Medications as of 09/23/2020  Medication  . omalizumab Arvid Right) injection 225 mg  . omalizumab Arvid Right) injection 225 mg  . omalizumab Arvid Right) injection 225 mg  . omalizumab Arvid Right) injection 225 mg  . omalizumab Arvid Right) injection 225 mg  . omalizumab Arvid Right) injection 225 mg  . omalizumab Arvid Right) injection 300 mg  . omalizumab Arvid Right) injection 300 mg     Immunization History  Administered Date(s) Administered  . Influenza Split 04/02/2010, 06/29/2012, 04/19/2013, 04/19/2014, 05/10/2015, 04/11/2016, 04/23/2016, 04/22/2018  . Influenza Whole 03/27/2012, 04/29/2014  . Influenza,inj,Quad PF,6+ Mos 04/12/2017, 04/22/2018, 03/12/2019, 03/24/2020  . PFIZER(Purple Top)SARS-COV-2 Vaccination 09/06/2019, 09/27/2019, 05/13/2020  . Pneumococcal Conjugate-13 01/21/2015  . Pneumococcal Polysaccharide-23 10/15/2012  . Tdap 05/11/2017  PFTs No flowsheet data found.   Eosinophils Most recent blood eosinophil count was 100 cells/microL taken on 07/27/20, 300 on 07/09/19 and 07/04/19  IgE 353 on 07/27/20, was 455 on 07/09/19, 227 on  10/11/16   Assessment   1. Biologics training (Omalizumab (Xolair) o MOA: IgG monoclonal antibody (recombinant DNA derived) which inhibits IgE binding to the high-affinity IgE receptor on mast cells and basophils.  o Response to therapy: up to 6 months to assess effectiveness (patient has been taking since 2019) o Side effects: Anaphylaxis (0.1%) (black boxed warning), injection site reaction (45%), arthralgia (2.9% to 8%), headache (3% to 15%) o Dosing: SubQ every 21 weeks based on weight and pretreatment serum IgE o Administration:  1. Doses >150 mg should be divided over more than one injection site (eg, 225 mg administered as two injections) 2. Do not inject into moles, scars, bruises, tender areas, or broken skin. 3. Injections may take 5 to 10 seconds to administer (solution is slightly viscous). 4. Recommended injection sites include the upper arm (if given by caregiver) and the front and middle of the thighs or abdomen  Patient able to self-administer. Administer in right and left thigh using appropriate technique: Name: Xolair 75mg /0.19mL [place]: [id number] Lot: [id number] Exp: 06/2021  Name: Arvid Right 75mg /0.73mL [place]: [id number] Lot: [id number] Exp:  06/2021  2. Medication Reconciliation  A drug regimen assessment was performed, including review of allergies, interactions, disease-state management, dosing and immunization history. Medications were reviewed with the patient, including name, instructions, indication, goals of therapy, potential side effects, importance of adherence, and safe use.  Drug interaction(s): none identified  3. Immunizations  Patient is UTD on influenzae and pneumonia vaccinations. No history of shingles vaccination - discussed with patient that the Shingles vaccine is recommended in individuals with asthma. Patient had Shingles when she was 65 y/o but we discussed that she is still indicated.  PLAN - Continue Xolair 225mg  SQ every 21 days. Rx sent to  [facility]. Patient provided with pharmacy phone number - Continue maintenance regimen as prescribed: Floven 277mcg (2 puffs twice daily), fexofenadone once daily - Patient's Epi-pen was expired. Re-sent Epipen to [facility] off of [address] - Patient will f/u with PCP in regards to Shingles vaccine.  All questions encouraged and answered.  Instructed patient to reach out with any further questions or concerns.  Thank you for allowing pharmacy to participate in this patient's care.  Knox Saliva, PharmD, MPH Clinical Pharmacist (Rheumatology and Pulmonology)

## 2020-09-25 ENCOUNTER — Telehealth: Payer: Self-pay | Admitting: Emergency Medicine

## 2020-09-25 NOTE — Telephone Encounter (Signed)
Called [facility] and gave consent for Xolair to ship to patient's home. Called patient and advised.

## 2020-11-10 ENCOUNTER — Other Ambulatory Visit (HOSPITAL_COMMUNITY): Payer: Self-pay | Admitting: Gastroenterology

## 2020-11-10 DIAGNOSIS — K219 Gastro-esophageal reflux disease without esophagitis: Secondary | ICD-10-CM

## 2020-11-24 ENCOUNTER — Telehealth: Payer: Self-pay | Admitting: Emergency Medicine

## 2020-11-24 ENCOUNTER — Other Ambulatory Visit: Payer: Self-pay | Admitting: Emergency Medicine

## 2020-11-24 NOTE — Telephone Encounter (Signed)
Spoke with pt and advised that Flovent refills were sent to pharmacy. Pt verbalized understanding. Nothing further needed.

## 2020-11-25 ENCOUNTER — Ambulatory Visit (HOSPITAL_COMMUNITY)
Admission: RE | Admit: 2020-11-25 | Discharge: 2020-11-25 | Disposition: A | Discharge status: Home / Self Care | Payer: 59 | Source: Ambulatory Visit | Attending: Gastroenterology | Admitting: Gastroenterology

## 2020-11-25 ENCOUNTER — Other Ambulatory Visit: Payer: Self-pay

## 2020-11-25 DIAGNOSIS — K219 Gastro-esophageal reflux disease without esophagitis: Secondary | ICD-10-CM | POA: Diagnosis present

## 2020-11-25 MED ORDER — TECHNETIUM TC 99M SULFUR COLLOID
2.0000 | Freq: Once | INTRAVENOUS | Status: AC | PRN
  Administered 2020-11-25: 2 via INTRAVENOUS

## 2021-01-05 ENCOUNTER — Telehealth: Payer: Self-pay | Admitting: Emergency Medicine

## 2021-01-05 DIAGNOSIS — J4550 Severe persistent asthma, uncomplicated: Secondary | ICD-10-CM

## 2021-01-05 NOTE — Telephone Encounter (Signed)
Noted. Will route message to pharmacy/injection team.

## 2021-01-05 NOTE — Telephone Encounter (Signed)
Pt is calling in regards to cutting back on her Xolair stated they talked about it in January; currently states that she takes it every 3 weeks and was wanting to know if she can decrease the frequency to once a month or once every 5 weeks. Pls regard; [phone]

## 2021-01-05 NOTE — Telephone Encounter (Signed)
Called and spoke with pt letting her know the info stated by Dr. Lamonte Sakai and she verbalized understanding. Order has been placed for labwork and f/u appt has been scheduled for pt to discuss results. Nothing further needed.

## 2021-01-05 NOTE — Telephone Encounter (Signed)
Please order an IgE. Based on this we can determine whether to extend to q4weeks. She can follow with RB or APP to discuss.

## 2021-01-05 NOTE — Telephone Encounter (Signed)
Pharmacist is out of office this week therefore we are unable to respond to clinical questions at this time. Routing to prescriber for advisement.

## 2021-01-19 ENCOUNTER — Other Ambulatory Visit: Payer: 59

## 2021-01-19 DIAGNOSIS — J4550 Severe persistent asthma, uncomplicated: Secondary | ICD-10-CM

## 2021-01-20 LAB — IGE: IgE (Immunoglobulin E), Serum: 463 kU/L — ABNORMAL HIGH (ref <=114)

## 2021-01-27 ENCOUNTER — Telehealth: Payer: Self-pay | Admitting: Emergency Medicine

## 2021-01-27 ENCOUNTER — Encounter: Payer: Self-pay | Admitting: Emergency Medicine

## 2021-01-27 ENCOUNTER — Ambulatory Visit (INDEPENDENT_AMBULATORY_CARE_PROVIDER_SITE_OTHER): Payer: 59 | Admitting: Emergency Medicine

## 2021-01-27 ENCOUNTER — Other Ambulatory Visit: Payer: Self-pay

## 2021-01-27 DIAGNOSIS — J479 Bronchiectasis, uncomplicated: Secondary | ICD-10-CM

## 2021-01-27 DIAGNOSIS — J4550 Severe persistent asthma, uncomplicated: Secondary | ICD-10-CM

## 2021-01-27 DIAGNOSIS — J309 Allergic rhinitis, unspecified: Secondary | ICD-10-CM | POA: Diagnosis not present

## 2021-01-27 MED ORDER — ALBUTEROL SULFATE HFA 108 (90 BASE) MCG/ACT IN AERS: INHALATION_SPRAY | RESPIRATORY_TRACT | 5 refills | Status: DC

## 2021-01-27 NOTE — Assessment & Plan Note (Signed)
Plan continue Allegra, with use of nasal spray.  Follow symptoms as they may increase as the Xolair frequency is decreased.

## 2021-01-27 NOTE — Patient Instructions (Addendum)
Please continue your Flovent 2 puffs twice a day.  Rinse and gargle after using. Keep albuterol available use 2 puffs when needed shortness of breath, chest tightness, wheezing. We will decrease your Xolair frequency to every 4 weeks.  We will calculate appropriate dosing today.  Depending on how you are doing with this change then we will consider a trial off of Xolair in the near future. Continue fluticasone nasal spray and Allegra as you have been taking them. Follow Dr. Lamonte Sakai in 2 months, sooner if you have any problems.

## 2021-01-27 NOTE — Assessment & Plan Note (Signed)
Well-controlled currently without any significant mucus burden, cough.  No mucous plugs, which had been her issue in the past.  Continue current management

## 2021-01-27 NOTE — Telephone Encounter (Signed)
Rx for pt's proventil inhaler has been sent to preferred pharmacy for pt. Called and spoke with pt letting her know this had been taken care of and she verbalized understanding. Nothing further needed.

## 2021-01-27 NOTE — Assessment & Plan Note (Signed)
Well-controlled currently on Flovent, Xolair 220 mg every 3 weeks.  Minimal albuterol use.  Minimal symptoms.  She is interested in trying to ultimately come off Xolair, at this time try to decrease frequency.  I think this is reasonable.  We will try to go to same dose every 4 weeks.  If well-tolerated then try coming off Xolair altogether.  I will follow-up with her in 2 months to see how she tolerates the change.

## 2021-01-27 NOTE — Progress Notes (Signed)
   Subjective:    Patient ID: Kathryn Griffin, female    DOB: 06/30/1955, 65 y.o.   MRN: [id number]  HPI  ROV 07/27/20 --follow-up visit for 65 year old woman. She has a history of severe persistent asthma, eosinophilia with question ABPA with some associated mild bronchiectasis by CT chest. We have been managing her on Xolair every 3 weeks. Currently on Flovent. She has not needed pred in over 2 yrs. She is not requiring any albuterol - very rare.   CT chest 06/08/2020 reviewed by me shows no mediastinal or hilar lymphadenopathy, minimal bilateral tubular bronchiectatic change with some biapical scarring, unchanged. There is a new cluster of centrilobular nodules in the medial right middle lobe, new groundglass opacity inferior right upper lobe 1.1 x 0.8 cm, new 4 mm left basilar pulmonary nodule  ROV 01/27/21 --65 year old woman whom we have followed for eosinophilic severe persistent asthma, bronchiectatic change on CT chest with a suspected history of ABPA (treated).  Has been on Xolair every 3 weeks, considering decreasing frequency.  She has bronchiectatic change and subtle micronodular disease on CT chest with plans for follow-up 12/22. She is on Allegra. Uses Flovent. She is not dealing with any cough, mucous plugs. She is interested in trying to get off Xolair at some point - interested in going to q4 weeks.   IgE 353 06/2020 IgE 463 12/2020   Review of Systems As per HPI     Objective:   Physical Exam Vitals:   01/27/21 0922  BP: 102/64  Pulse: 78  SpO2: 97%  Weight: 157 lb 3.2 oz (71.3 kg)  Height: '5\' 7"'$  (1.702 m)   Gen: Pleasant, well-nourished, in no distress,  normal affect  ENT: No lesions,  mouth clear,  oropharynx clear, no postnasal drip  Neck: No JVD, no stridor  Lungs: No use of accessory muscles, no crackles or wheezing on normal respiration, no wheeze on forced expiration  Cardiovascular: RRR, heart sounds normal, no murmur or gallops, no peripheral  edema  Musculoskeletal: No deformities, no cyanosis or clubbing  Neuro: alert, awake, non focal  Skin: Warm, no lesions or rash      Assessment & Plan:  Bronchiectasis without complication (HCC) Well-controlled currently without any significant mucus burden, cough.  No mucous plugs, which had been her issue in the past.  Continue current management  Asthma Well-controlled currently on Flovent, Xolair 220 mg every 3 weeks.  Minimal albuterol use.  Minimal symptoms.  She is interested in trying to ultimately come off Xolair, at this time try to decrease frequency.  I think this is reasonable.  We will try to go to same dose every 4 weeks.  If well-tolerated then try coming off Xolair altogether.  I will follow-up with her in 2 months to see how she tolerates the change.  Allergic rhinitis Plan continue Allegra, with use of nasal spray.  Follow symptoms as they may increase as the Xolair frequency is decreased.  Time spent 35 minutes.  Baltazar Apo, MD, PhD 01/27/2021, 10:26 AM  Pulmonary and Critical Care [phone] or if no answer [phone]

## 2021-01-28 ENCOUNTER — Other Ambulatory Visit: Payer: Self-pay | Admitting: Family Medicine

## 2021-01-28 ENCOUNTER — Telehealth: Payer: Self-pay | Admitting: Emergency Medicine

## 2021-01-28 DIAGNOSIS — Z1231 Encounter for screening mammogram for malignant neoplasm of breast: Secondary | ICD-10-CM

## 2021-01-28 NOTE — Telephone Encounter (Signed)
Called and spoke with patient. She was seen by RB yesterday and a plan to decrease the Xolair was discussed. She stated that she did not feel comfortable with "just guessing the dosage to decrease Xolair". I advised her that per the AVS, the correct dosage would calculated and not "guessed". She verbalized understanding but still stated that she didn't feel comfortable. She stated that BQ had a plan for her and she felt comfortable with his plan but she can not remember the plan. I reminded her that BQ is no longer seeing patients in the outpatient setting. I advised her that RB is very capable of managing her care.    She wants to know if there is anyway possible RB can speak with BQ in regards to tapering her off of the Xolair.   RB, can you please advise.

## 2021-01-29 NOTE — Telephone Encounter (Signed)
Kathryn Griffin -  Would you mind reviewing her chart?.  She used to follow with you and is interested in coming off Xolair.  Clinically doing quite well.  On 225 mg every 3 weeks.  She wanted to go to every 4 weeks, has a goal to stop completely.  I initially calculated her dose based on her existing IgE (which remains elevated), but this effectively would have increased her cumulative dose which seems counterproductive since we are trying to get the medication off.  I then asked her to just go to 225 mg every 4 weeks to see if she tolerates, with a plan to stop if so.  Also explained to her that often we will just stop Xolair without waiting to see if patient tolerate.  Anyway I think she does not have faith in the plan since it is a bit contrived and there is no algorithm to follow.  That is why she wants your input and blessing.  Thanks for looking at it.  Rob

## 2021-01-30 ENCOUNTER — Ambulatory Visit
Admission: RE | Admit: 2021-01-30 | Discharge: 2021-01-30 | Disposition: A | Discharge status: Home / Self Care | Payer: 59 | Source: Ambulatory Visit | Attending: Family Medicine | Admitting: Family Medicine

## 2021-01-30 ENCOUNTER — Other Ambulatory Visit: Payer: Self-pay

## 2021-01-30 DIAGNOSIS — Z1231 Encounter for screening mammogram for malignant neoplasm of breast: Secondary | ICD-10-CM

## 2021-02-01 NOTE — Telephone Encounter (Signed)
Please let the patient know that I reviewed the case with Dr Lake Bells. He agrees that we should give same dose at decreased frequency of every 4 weeks. We will do this for 3 months and then try to stop the Xolair.

## 2021-02-01 NOTE — Telephone Encounter (Signed)
Called and spoke with patient, advised of recommendations per Dr. Lamonte Sakai and Dr. Lake Bells.  She verbalized understanding.  Nothing further needed.

## 2021-02-25 ENCOUNTER — Telehealth: Payer: Self-pay | Admitting: Adult Health

## 2021-02-25 ENCOUNTER — Telehealth: Payer: Self-pay | Admitting: Emergency Medicine

## 2021-02-25 ENCOUNTER — Telehealth (INDEPENDENT_AMBULATORY_CARE_PROVIDER_SITE_OTHER): Payer: 59 | Admitting: Adult Health

## 2021-02-25 ENCOUNTER — Encounter: Payer: Self-pay | Admitting: Adult Health

## 2021-02-25 VITALS — Temp 99.0°F | Ht 67.0 in

## 2021-02-25 DIAGNOSIS — U071 COVID-19: Secondary | ICD-10-CM

## 2021-02-25 DIAGNOSIS — J4550 Severe persistent asthma, uncomplicated: Secondary | ICD-10-CM

## 2021-02-25 MED ORDER — MOLNUPIRAVIR EUA 200MG CAPSULE
4.0000 | ORAL_CAPSULE | Freq: Two times a day (BID) | ORAL | 0 refills | Status: AC
Start: 2021-02-25 — End: 2021-03-02

## 2021-02-25 NOTE — Telephone Encounter (Signed)
Can see her via video visit at 315 today  Or I have openings for video visit tomorrow as well   Please contact office for sooner follow up if symptoms do not improve or worsen or seek emergency care

## 2021-02-25 NOTE — Telephone Encounter (Signed)
Called and spoke with patient. She stated that she spoke with her pharmacist about the possible interactions with Paxlovid with her current medications. Per pharmacist, the Paxlovid would increase the levels of Flovent.   Because of this, she wants to try the other antiviral medication.   TP, can you please advise? Thanks.

## 2021-02-25 NOTE — Telephone Encounter (Signed)
Primary Pulmonologist: Kathryn Griffin Last office visit and with whom: 01/27/21 with Kathryn Griffin What do we see them for (pulmonary problems): bronchiectasis and severe persistent asthma Last OV assessment/plan: Bronchiectasis without complication ([place]) Well-controlled currently without any significant mucus burden, cough.  No mucous plugs, which had been her issue in the past.  Continue current management   Asthma Well-controlled currently on Flovent, Xolair 220 mg every 3 weeks.  Minimal albuterol use.  Minimal symptoms.  She is interested in trying to ultimately come off Xolair, at this time try to decrease frequency.  I think this is reasonable.  We will try to go to same dose every 4 weeks.  If well-tolerated then try coming off Xolair altogether.  I will follow-up with her in 2 months to see how she tolerates the change.   Allergic rhinitis Plan continue Allegra, with use of nasal spray.  Follow symptoms as they may increase as the Xolair frequency is decreased.  Was appointment offered to patient (explain)?  No, patient has covid.    Reason for call: Called and spoke with patient. She stated that she tested positive for covid this morning at home. She started having symptoms yesterday afternoon with a cough, fever and chills. She was exposed to covid via someone else in her household. The test from yesterday was negative.   Her cough is non-productive but it is deep. She is unable to cough up the phlegm. She still has a slight fever this morning. She denied any wheezing.   (examples of things to ask: : When did symptoms start? Fever? Cough? Productive? Color to sputum? More sputum than usual? Wheezing? Have you needed increased oxygen? Are you taking your respiratory medications? What over the counter measures have you tried?)  Allergies  Allergen Reactions   Molds & Smuts Shortness Of Breath   Dexilant [Dexlansoprazole] Anxiety   Protonix [Pantoprazole Sodium] Anxiety    Immunization History   Administered Date(s) Administered   Influenza Split 04/02/2010, 06/29/2012, 04/19/2013, 04/19/2014, 05/10/2015, 04/11/2016, 04/23/2016, 04/22/2018   Influenza Whole 03/27/2012, 04/29/2014   Influenza,inj,Quad PF,6+ Mos 04/12/2017, 04/22/2018, 03/12/2019, 03/24/2020   PFIZER(Purple Top)SARS-COV-2 Vaccination 09/06/2019, 09/27/2019, 05/13/2020   Pneumococcal Conjugate-13 01/21/2015   Pneumococcal Polysaccharide-23 10/15/2012   Tdap 05/11/2017   She wants to know if she can be prescribed an antiviral. Pharmacy is [facility] on [address].   TP, can you please advise since Kathryn Griffin is not available today? Thanks!

## 2021-02-25 NOTE — Telephone Encounter (Signed)
Per TP, ok to double book at [place] and spoke with patient, she agreed to the video visit this afternoon at 3pm. I went over the MyChart video instructions.   Nothing further needed at time of call.

## 2021-02-25 NOTE — Telephone Encounter (Signed)
Called and spoke with patient. She is aware that the molnupiravir has been sent in for her.   Nothing further needed at time of call.

## 2021-02-25 NOTE — Telephone Encounter (Signed)
315 slot is not longer available. Called patient but she did not answer. Left message for her to call back.

## 2021-02-25 NOTE — Telephone Encounter (Signed)
That is fine .  Molnupiravir for 5 days , rx sent to pharm .  Cont w/ ov recs Please contact office for sooner follow up if symptoms do not improve or worsen or seek emergency care

## 2021-02-25 NOTE — Patient Instructions (Signed)
Continue on current regimen  Tylenol as needed for fever. Fluids and rest. Saline nasal rinses as needed Albuterol inhaler 1 to 2 puffs every 4-6 hours as needed. Follow-up with Dr. Lamonte Sakai as planned and as needed Please contact office for sooner follow up if symptoms do not improve or worsen or seek emergency care

## 2021-02-25 NOTE — Progress Notes (Signed)
Patient reports that she has sinus drainage, hoarseness, headache, some chills and headache.  She has taken Tylenol and Mucinex today.

## 2021-02-25 NOTE — Progress Notes (Signed)
Virtual Visit via Video Note  I connected with Kathryn Griffin on 02/25/21 at  3:00 PM EDT by a video enabled telemedicine application and verified that I am speaking with the correct person using two identifiers.  Location: Patient: Home  Provider: Office    I discussed the limitations of evaluation and management by telemedicine and the availability of in person appointments. The patient expressed understanding and agreed to proceed.  History of Present Illness: 65 year old female followed for her severe persistent asthma, eosinophilia with suspected history of ABPA (treated), bronchiectasis  Today's video visit is for an acute office visit.  Patient tested positive for COVID-19 today.  She has been vaccinated x3   For COVID-19.  Patient had 2 family members husband and daughter who had recently had COVID-75.  They have been trying to quarantine at home but 2 days ago she started get some postnasal drainage and stuffy nose.  Yesterday her cough and fever started.  She is had a low-grade fever T-max of 100.  She has had a minimally productive cough.  No wheezing.  Patient says that she did have an urgent care appointment earlier today.  And was given Paxlovid which she just picked up.  Patient denies any hemoptysis, chest pain, orthopnea, calf pain.  She is eating and drinking.  She has been taken Tylenol and Mucinex for symptom control.  Past Medical History:  Diagnosis Date   Aspergillus fumigatus ([place]) 11/14   Hx of gestational diabetes mellitus, not currently pregnant    Osteopenia 07/04/2017   T score -2.1 FRAX 9.6% / 1.3%   Prediabetes    Reflux    Vaginal atrophy    Vitamin D deficiency    Vitiligo    Current Outpatient Medications on File Prior to Visit  Medication Sig Dispense Refill   albuterol (PROVENTIL) (2.5 MG/3ML) 0.083% nebulizer solution USE 1 VIAL VIA NEBULIZER EVERY 6 HOURS AS NEEDED FOR WHEEZING OR SHORTNESS OF BREATH 360 mL 11   albuterol (VENTOLIN HFA) 108  (90 Base) MCG/ACT inhaler inhale 2 puffs by mouth every 4 hours if needed 18 g 5   Calcium Carbonate-Vitamin D 600-200 MG-UNIT TABS 2 tablets     Calcium Citrate (CITRACAL PO) Take 1 tablet by mouth daily at 8 pm.      Cholecalciferol (VITAMIN D) 2000 units CAPS Take 1 capsule by mouth daily.     conjugated estrogens (PREMARIN) vaginal cream APPLY 1/4 APPLICATOR TWICE WEEKLY VAGINALLY AND A SMALL AMOUNT ON THE VULVA AS NEEDED 30 g 4   EPINEPHrine 0.3 mg/0.3 mL IJ SOAJ injection Inject 0.3 mg into the muscle as needed for anaphylaxis. 1 each 2   famotidine (PEPCID) 20 MG tablet Take 20 mg by mouth as needed.      fexofenadine (ALLEGRA) 180 MG tablet Take 180 mg by mouth daily.     FLOVENT HFA 220 MCG/ACT inhaler INHALE 2 PUFFS INTO THE LUNGS IN THE MORNING AND AT BEDTIME 12 g 5   fluticasone (FLONASE) 50 MCG/ACT nasal spray Place 2 sprays into both nostrils daily.      fluticasone (FLOVENT HFA) 110 MCG/ACT inhaler Inhale into the lungs 2 (two) times daily.     meloxicam (MOBIC) 15 MG tablet Take 1 tablet by mouth daily.     Multiple Vitamin (MULTIVITAMIN) capsule Take 1 capsule by mouth daily.     mupirocin ointment (BACTROBAN) 2 % Apply topically 3 (three) times daily.     omalizumab Arvid Right) 150 MG/ML prefilled syringe Inject '150mg'$  every  21 days IN ADDITION TO one '75mg'$  syringe. (TOTAL DOSE of 225 mg every 21 days). Deliver to patient's home for self-admin 1 mL 5   omalizumab Arvid Right) 75 MG/0.5ML prefilled syringe Inject '75mg'$  every 21 days IN ADDITION TO one '150mg'$  syringe. (TOTAL DOSE of 225 mg every 21 days). Deliver to patient's home for self-admin 0.5 mL 5   omeprazole (PRILOSEC) 40 MG capsule Take 40 mg by mouth daily.     pimecrolimus (ELIDEL) 1 % cream Apply 1 application topically 2 (two) times daily.     Respiratory Therapy Supplies (FLUTTER) DEVI Use as directed 1 each 0   sodium chloride HYPERTONIC 3 % nebulizer solution Take by nebulization 2 (two) times daily. 750 mL 11    Spacer/Aero-Holding Chambers (AEROCHAMBER MV) inhaler Use as instructed 1 each 0   Current Facility-Administered Medications on File Prior to Visit  Medication Dose Route Frequency Provider Last Rate Last Admin   omalizumab Arvid Right) injection 225 mg  225 mg Subcutaneous Q14 Days Simonne Maffucci B, MD   225 mg at 08/06/18 1446   omalizumab Arvid Right) injection 225 mg  225 mg Subcutaneous Q21 days Simonne Maffucci B, MD   225 mg at 01/07/19 1508   omalizumab (XOLAIR) injection 225 mg  225 mg Subcutaneous Q14 Days Simonne Maffucci B, MD   225 mg at 02/18/19 1436   omalizumab Arvid Right) injection 225 mg  225 mg Subcutaneous Q14 Days Simonne Maffucci B, MD   225 mg at 05/15/19 1425   omalizumab Arvid Right) injection 225 mg  225 mg Subcutaneous Q14 Days Collene Gobble, MD   225 mg at 10/08/19 1504   omalizumab (XOLAIR) injection 225 mg  225 mg Subcutaneous Q14 Days Collene Gobble, MD   225 mg at 11/20/19 1500   omalizumab Arvid Right) injection 300 mg  300 mg Subcutaneous Q14 Days Simonne Maffucci B, MD   300 mg at 03/11/19 1355   omalizumab Arvid Right) injection 300 mg  300 mg Subcutaneous Q14 Days Collene Gobble, MD   300 mg at 01/28/20 1432      Observations/Objective: Appears in no acute distress.  No increased work of breathing is noted.  Speaks in full sentences with no audible wheezing   Assessment and Plan: COVID-19 infection-agree with antiviral therapy.  I did discuss with patient regarding possible drug interactions with Paxlovid.  Have asked her to discuss with her pharmacist as well.  Would hold Premarin for the next week while taking.  She has no known history of kidney or renal disease.  No recent lab work is available.  Last serum creatinine was normal. Quarantine/COVID education was given.   Plan  Patient Instructions  Continue on current regimen  Tylenol as needed for fever. Fluids and rest. Saline nasal rinses as needed Albuterol inhaler 1 to 2 puffs every 4-6 hours as  needed. Follow-up with Dr. Lamonte Sakai as planned and as needed Please contact office for sooner follow up if symptoms do not improve or worsen or seek emergency care      Follow Up Instructions: Patient is instructed if symptoms or not improving to call our office back for sooner follow-up.   I discussed the assessment and treatment plan with the patient. The patient was provided an opportunity to ask questions and all were answered. The patient agreed with the plan and demonstrated an understanding of the instructions.   The patient was advised to call back or seek an in-person evaluation if the symptoms worsen or if the condition fails to  improve as anticipated.  I provided 20 minutes of non-face-to-face time during this encounter.   Rexene Edison, NP

## 2021-04-12 ENCOUNTER — Telehealth: Payer: Self-pay | Admitting: Emergency Medicine

## 2021-04-12 NOTE — Telephone Encounter (Signed)
Spoke with the pt  She states that RB discussed possibly stopping xolair at last ov with him, but she never heard anything else  She is due for appt with Dr Lamonte Sakai  I have scheduled her for 05/04/21  She asks if needs labs done prior  Please advise if you want her to come in prior for any labs, thanks!

## 2021-04-13 NOTE — Telephone Encounter (Signed)
Called and spoke with patient and provided recommendations per Dr. Lamonte Sakai.  Patient verbalized understanding.  Nothing further needed.

## 2021-04-13 NOTE — Telephone Encounter (Signed)
I think we can hold off on the labs - will probably check them after she has been off the xolair for a few months. If she is doing well at our [place] then we will probably stop the xolair and see how she does.

## 2021-04-23 ENCOUNTER — Other Ambulatory Visit: Payer: Self-pay | Admitting: Emergency Medicine

## 2021-05-04 ENCOUNTER — Other Ambulatory Visit: Payer: Self-pay

## 2021-05-04 ENCOUNTER — Encounter: Payer: Self-pay | Admitting: Emergency Medicine

## 2021-05-04 ENCOUNTER — Ambulatory Visit (INDEPENDENT_AMBULATORY_CARE_PROVIDER_SITE_OTHER): Payer: 59 | Admitting: Emergency Medicine

## 2021-05-04 DIAGNOSIS — J309 Allergic rhinitis, unspecified: Secondary | ICD-10-CM | POA: Diagnosis not present

## 2021-05-04 DIAGNOSIS — J4550 Severe persistent asthma, uncomplicated: Secondary | ICD-10-CM

## 2021-05-04 DIAGNOSIS — J479 Bronchiectasis, uncomplicated: Secondary | ICD-10-CM

## 2021-05-04 NOTE — Progress Notes (Signed)
   Subjective:    Patient ID: Kathryn Griffin, female    DOB: Jan 20, 1956, 65 y.o.   MRN: [id number]  HPI  ROV 07/27/20 --follow-up visit for 65 year old woman. She has a history of severe persistent asthma, eosinophilia with question ABPA with some associated mild bronchiectasis by CT chest. We have been managing her on Xolair every 3 weeks. Currently on Flovent. She has not needed pred in over 2 yrs. She is not requiring any albuterol - very rare.   CT chest 06/08/2020 reviewed by me shows no mediastinal or hilar lymphadenopathy, minimal bilateral tubular bronchiectatic change with some biapical scarring, unchanged. There is a new cluster of centrilobular nodules in the medial right middle lobe, new groundglass opacity inferior right upper lobe 1.1 x 0.8 cm, new 4 mm left basilar pulmonary nodule  ROV 01/27/21 --65 year old woman whom we have followed for eosinophilic severe persistent asthma, bronchiectatic change on CT chest with a suspected history of ABPA (treated).  Has been on Xolair every 3 weeks, considering decreasing frequency.  She has bronchiectatic change and subtle micronodular disease on CT chest with plans for follow-up 12/22. She is on Allegra. Uses Flovent. She is not dealing with any cough, mucous plugs. She is interested in trying to get off Xolair at some point - interested in going to q4 weeks.   IgE 353 06/2020 IgE 463 12/2020  ROV 05/04/21 --65 year old woman with a history of severe persistent asthma and eosinophilic phenotype, bronchiectatic change on CT chest.  We suspected ABPA and it was treated.  She has been on Xolair every 4 weeks, decreased at her last visit from every 3 weeks.  Currently on Flovent, Allegra. She reports that she has been doing very well. Never needs to use albuterol. No real SOB or cough. No congestion or rhinitis sx.    Review of Systems As per HPI     Objective:   Physical Exam Vitals:   05/04/21 1427  BP: 112/72  Pulse: 78  Temp: 98.4  F (36.9 C)  TempSrc: Oral  SpO2: 98%  Weight: 159 lb 3.2 oz (72.2 kg)  Height: 5' 7.5" (1.715 m)   Gen: Pleasant, well-nourished, in no distress,  normal affect  ENT: No lesions,  mouth clear,  oropharynx clear, no postnasal drip  Neck: No JVD, no stridor  Lungs: No use of accessory muscles, no crackles or wheezing on normal respiration, no wheeze on forced expiration  Cardiovascular: RRR, heart sounds normal, no murmur or gallops, no peripheral edema  Musculoskeletal: No deformities, no cyanosis or clubbing  Neuro: alert, awake, non focal  Skin: Warm, no lesions or rash      Assessment & Plan:  Asthma Doing very well clinically.  We have been weaning down her Xolair frequency, tolerated going to every 4 weeks.  We will plan to come off Xolair now and see if she has any recurrence of either asthma or allergy symptoms.  Continue Flovent, albuterol as needed.  Continue to manage her allergies as below.  We will likely recheck an eosinophil count and IgE to establish a posttreatment baseline at her next visit.  Allergic rhinitis Allegra, fluticasone nasal spray  Bronchiectasis without complication (HCC) Subtle bronchiectatic change and micronodular disease on CT chest.  Asymptomatic.  Planning to repeat a surveillance CT chest in December 2022.  Time spent 35 minutes.  Baltazar Apo, MD, PhD 05/04/2021, 2:49 PM [facility] [phone] or if no answer [phone]

## 2021-05-04 NOTE — Patient Instructions (Addendum)
We will stop your Xolair now Continue your Flovent 2 puffs twice a day.  Rinse and gargle after using. Keep albuterol available to use 2 puffs when needed for shortness of breath, chest tightness, wheezing. Continue your Allegra as you have been taking it. Continue fluticasone nasal spray Follow with Dr. Lamonte Sakai in 2 months or sooner if you have any problems.

## 2021-05-04 NOTE — Assessment & Plan Note (Signed)
Subtle bronchiectatic change and micronodular disease on CT chest.  Asymptomatic.  Planning to repeat a surveillance CT chest in December 2022.

## 2021-05-04 NOTE — Assessment & Plan Note (Signed)
Doing very well clinically.  We have been weaning down her Xolair frequency, tolerated going to every 4 weeks.  We will plan to come off Xolair now and see if she has any recurrence of either asthma or allergy symptoms.  Continue Flovent, albuterol as needed.  Continue to manage her allergies as below.  We will likely recheck an eosinophil count and IgE to establish a posttreatment baseline at her next visit.

## 2021-05-04 NOTE — Assessment & Plan Note (Signed)
Allegra, fluticasone nasal spray

## 2021-06-23 ENCOUNTER — Telehealth: Payer: Self-pay | Admitting: Emergency Medicine

## 2021-06-23 MED ORDER — PREDNISONE 10 MG PO TABS: ORAL_TABLET | ORAL | 0 refills | Status: AC

## 2021-06-23 MED ORDER — AZITHROMYCIN 250 MG PO TABS: ORAL_TABLET | ORAL | 0 refills | Status: DC

## 2021-06-23 NOTE — Telephone Encounter (Signed)
Pt has been sick since last Wednesday. Daughter has the flu, but pt does not think it's that. She believes it's pneumonia. Feels like her lungs are full. Coughing, and congested. No fever. No appts available with any APPs or RB. Pharmacy is [facility] on [address].

## 2021-06-23 NOTE — Telephone Encounter (Signed)
Spoke with pt who states that allergy symptoms started last Wednesday (drainage in back of throat, head congestion). Pt states LGT on Thursday of 99.0. Daughter has been dx with flu, pt given Tamiflu but never took it. Pt states now chest feel "full". Pt states productive cough with think yellow mucus which albuterol does help somewhat. Pt denies GI upset. Pt states having chills yesterday. Pt states during coughing spell yesterday O2 was 88% but once pt took a couple of deep breaths O2 was 93 - 94%. Dr. Lamonte Sakai please advise.

## 2021-06-23 NOTE — Telephone Encounter (Signed)
Called and spoke with patient to give her the recs from Dr. Lamonte Sakai. 2 prescriptions have been sent into preferred pharmacy. Nothing further needed at this time.

## 2021-06-23 NOTE — Telephone Encounter (Signed)
Please treat for flare: Pred > Take 40mg  daily for 3 days, then 30mg  daily for 3 days, then 20mg  daily for 3 days, then 10mg  daily for 3 days, then stop Azithro > Z pack

## 2021-06-25 ENCOUNTER — Ambulatory Visit: Payer: 59

## 2021-06-25 ENCOUNTER — Encounter (HOSPITAL_BASED_OUTPATIENT_CLINIC_OR_DEPARTMENT_OTHER): Payer: Self-pay | Admitting: Emergency Medicine

## 2021-06-25 ENCOUNTER — Other Ambulatory Visit: Payer: Self-pay

## 2021-06-25 ENCOUNTER — Emergency Department (HOSPITAL_BASED_OUTPATIENT_CLINIC_OR_DEPARTMENT_OTHER): Payer: 59

## 2021-06-25 ENCOUNTER — Emergency Department (HOSPITAL_BASED_OUTPATIENT_CLINIC_OR_DEPARTMENT_OTHER)
Admission: EM | Admit: 2021-06-25 | Discharge: 2021-06-25 | Disposition: A | Discharge status: Home / Self Care | Payer: 59 | Attending: Emergency Medicine | Admitting: Emergency Medicine

## 2021-06-25 ENCOUNTER — Ambulatory Visit (INDEPENDENT_AMBULATORY_CARE_PROVIDER_SITE_OTHER): Payer: 59

## 2021-06-25 ENCOUNTER — Telehealth: Payer: Self-pay | Admitting: Emergency Medicine

## 2021-06-25 ENCOUNTER — Telehealth: Payer: Self-pay | Admitting: *Deleted

## 2021-06-25 ENCOUNTER — Encounter: Payer: Self-pay | Admitting: Adult Health

## 2021-06-25 ENCOUNTER — Ambulatory Visit (INDEPENDENT_AMBULATORY_CARE_PROVIDER_SITE_OTHER): Payer: 59 | Admitting: Adult Health

## 2021-06-25 VITALS — BP 110/70 | HR 93 | Temp 98.2°F | Ht 67.5 in | Wt 155.8 lb

## 2021-06-25 DIAGNOSIS — R059 Cough, unspecified: Secondary | ICD-10-CM

## 2021-06-25 DIAGNOSIS — R051 Acute cough: Secondary | ICD-10-CM

## 2021-06-25 DIAGNOSIS — Z7951 Long term (current) use of inhaled steroids: Secondary | ICD-10-CM | POA: Diagnosis not present

## 2021-06-25 DIAGNOSIS — Z79899 Other long term (current) drug therapy: Secondary | ICD-10-CM | POA: Insufficient documentation

## 2021-06-25 DIAGNOSIS — J4551 Severe persistent asthma with (acute) exacerbation: Secondary | ICD-10-CM

## 2021-06-25 DIAGNOSIS — R0602 Shortness of breath: Secondary | ICD-10-CM | POA: Diagnosis not present

## 2021-06-25 DIAGNOSIS — Z20822 Contact with and (suspected) exposure to covid-19: Secondary | ICD-10-CM | POA: Diagnosis not present

## 2021-06-25 DIAGNOSIS — J9819 Other pulmonary collapse: Secondary | ICD-10-CM

## 2021-06-25 DIAGNOSIS — J188 Other pneumonia, unspecified organism: Secondary | ICD-10-CM | POA: Diagnosis not present

## 2021-06-25 DIAGNOSIS — J45909 Unspecified asthma, uncomplicated: Secondary | ICD-10-CM | POA: Insufficient documentation

## 2021-06-25 DIAGNOSIS — J189 Pneumonia, unspecified organism: Secondary | ICD-10-CM

## 2021-06-25 HISTORY — DX: Unspecified asthma, uncomplicated: J45.909

## 2021-06-25 LAB — CBC
HCT: 40.6 % (ref 36.0–46.0)
Hemoglobin: 13.4 g/dL (ref 12.0–15.0)
MCH: 30.6 pg (ref 26.0–34.0)
MCHC: 33 g/dL (ref 30.0–36.0)
MCV: 92.7 fL (ref 80.0–100.0)
Platelets: 348 10*3/uL (ref 150–400)
RBC: 4.38 MIL/uL (ref 3.87–5.11)
RDW: 12.5 % (ref 11.5–15.5)
WBC: 9.6 10*3/uL (ref 4.0–10.5)
nRBC: 0 % (ref 0.0–0.2)

## 2021-06-25 LAB — BASIC METABOLIC PANEL
Anion gap: 10 (ref 5–15)
BUN: 18 mg/dL (ref 8–23)
CO2: 28 mmol/L (ref 22–32)
Calcium: 9.5 mg/dL (ref 8.9–10.3)
Chloride: 101 mmol/L (ref 98–111)
Creatinine, Ser: 0.68 mg/dL (ref 0.44–1.00)
GFR, Estimated: >60 mL/min (ref >60)
Glucose, Bld: 136 mg/dL — ABNORMAL HIGH (ref 70–99)
Potassium: 3.6 mmol/L (ref 3.5–5.1)
Sodium: 139 mmol/L (ref 135–145)

## 2021-06-25 LAB — TROPONIN I (HIGH SENSITIVITY): Troponin I (High Sensitivity): <2 ng/L (ref <18)

## 2021-06-25 LAB — RESP PANEL BY RT-PCR (FLU A&B, COVID) ARPGX2
Influenza A by PCR: NEGATIVE
Influenza B by PCR: NEGATIVE
SARS Coronavirus 2 by RT PCR: NEGATIVE

## 2021-06-25 LAB — POCT INFLUENZA A/B
Influenza A, POC: NEGATIVE
Influenza B, POC: NEGATIVE

## 2021-06-25 LAB — D-DIMER, QUANTITATIVE: D-Dimer, Quant: 0.31 ug/mL-FEU (ref 0.00–0.50)

## 2021-06-25 MED ORDER — ALBUTEROL SULFATE (2.5 MG/3ML) 0.083% IN NEBU: INHALATION_SOLUTION | RESPIRATORY_TRACT | 11 refills | Status: AC

## 2021-06-25 MED ORDER — METHYLPREDNISOLONE SODIUM SUCC 125 MG IJ SOLR
125.0000 mg | Freq: Once | INTRAMUSCULAR | Status: AC
  Administered 2021-06-25: 15:00:00 125 mg via INTRAVENOUS
  Filled 2021-06-25: qty 2

## 2021-06-25 MED ORDER — LEVOFLOXACIN 750 MG PO TABS: 750.0000 mg | ORAL_TABLET | Freq: Every day | ORAL | 0 refills | Status: DC

## 2021-06-25 MED ORDER — LEVOFLOXACIN 750 MG PO TABS
750.0000 mg | ORAL_TABLET | Freq: Once | ORAL | Status: AC
  Administered 2021-06-25: 19:00:00 750 mg via ORAL
  Filled 2021-06-25: qty 1

## 2021-06-25 MED ORDER — SODIUM CHLORIDE 3 % IN NEBU: INHALATION_SOLUTION | Freq: Two times a day (BID) | RESPIRATORY_TRACT | 11 refills | Status: DC

## 2021-06-25 MED ORDER — ALBUTEROL (5 MG/ML) CONTINUOUS INHALATION SOLN
10.0000 mg/h | INHALATION_SOLUTION | RESPIRATORY_TRACT | Status: DC
  Administered 2021-06-25: 15:00:00 10 mg/h via RESPIRATORY_TRACT
  Filled 2021-06-25: qty 20

## 2021-06-25 MED ORDER — MAGNESIUM SULFATE 2 GM/50ML IV SOLN
2.0000 g | Freq: Once | INTRAVENOUS | Status: AC
  Administered 2021-06-25: 15:00:00 2 g via INTRAVENOUS
  Filled 2021-06-25: qty 50

## 2021-06-25 MED ORDER — FLUTICASONE FUROATE-VILANTEROL 100-25 MCG/ACT IN AEPB: 1.0000 | INHALATION_SPRAY | Freq: Every day | RESPIRATORY_TRACT | 5 refills | Status: DC

## 2021-06-25 NOTE — ED Triage Notes (Signed)
Pt arrives pov with c/o shob worsening today, endorses asthma, using inhaler more freq. PCP referred d/t concern for PE.

## 2021-06-25 NOTE — Telephone Encounter (Signed)
Received call from [facility] regarding labs to be drawn prior to patient going to ED.  I was told by the lab tech that the last courier has already been for the day and she would have to go to [place] to have it drawn or have it drawn at the ER.  I advised the lab tech that we could not count on the ER to draw the labs that Rexene Edison NP requested to r/o a clot.  Patient went to ER for evaluation.

## 2021-06-25 NOTE — Telephone Encounter (Signed)
Called and spoke with patient, advised to come to the office for CXR and evaluation.  She verbalized understanding.

## 2021-06-25 NOTE — ED Provider Notes (Signed)
[facility] Provider Note   CSN: [id number] Arrival date & time: 06/25/21  1248     History Chief Complaint  Patient presents with   Shortness of Breath    Kathryn Griffin is a 65 y.o. female with a history of bronchitis, bronchiectasis, ABPA (treated), Covid-19 in Sept 2022, presenting to [facility] shortness of breath.  The patient reports that she had congestion and malaise for the past several days, reports that her daughter lives with her tested positive for flu about a week ago.  The patient reports her short of breath significantly worsened this morning when she woke up.  She does have a nebulizer machine at home but has not had to use it in about 2 years.  She was able to schedule an appointment with her pulmonology clinic, however there was some concern for her work of breathing, possibility of a blood clot, she was referred to the ED for further evaluation.  She did have bronchoscopy performed in 2014 which showed large left upper sided mucous plug, bronc culture growing Aspergillus.  She was treated for this.  She has no prior history of clots.  She had been taken off of her Xolair about 2 months ago.  HPI     Past Medical History:  Diagnosis Date   Aspergillus fumigatus ([place]) 04/2013   Asthma    Hx of gestational diabetes mellitus, not currently pregnant    Osteopenia 07/04/2017   T score -2.1 FRAX 9.6% / 1.3%   Prediabetes    Reflux    Vaginal atrophy    Vitamin D deficiency    Vitiligo     Patient Active Problem List   Diagnosis Date Noted   Injection education, encounter for 09/23/2020   Bronchiectasis without complication ([place]) 18/84/1660   SOB (shortness of breath) 08/11/2017   Asthma 07/07/2017   H/O vitamin D deficiency 10/22/2015   Allergic rhinitis 09/04/2014   Chest pain 02/25/2014   Acute bronchitis 07/30/2013   Infection due to aspergillus fumigatus ([place]) 07/18/2013   Lung collapse 05/31/2013    Stridor 05/22/2013   Family history of diabetes mellitus 02/12/2013   Osteopenia 02/12/2013   Menopause 02/12/2013   Cough 10/15/2012    Past Surgical History:  Procedure Laterality Date   BREAST CYST EXCISION  1990   COLPOSCOPY  2009   benign   TUBAL LIGATION  1995     OB History     Gravida  4   Para  2   Term      Preterm      AB  2   Living  2      SAB  2   IAB      Ectopic      Multiple      Live Births              Family History  Problem Relation Age of Onset   Cancer Mother        bladder   Hypertension Mother    Diabetes Brother    Hypertension Maternal Grandmother    Ovarian cancer Maternal Grandmother    Heart attack Maternal Grandmother    Diabetes Paternal Grandmother    COPD Father        heavy smoker   Prostate cancer Father    Cancer Father        Lung   Hypertension Father    Breast cancer Neg Hx     Social History   Tobacco  Use   Smoking status: Never   Smokeless tobacco: Never  Vaping Use   Vaping Use: Never used  Substance Use Topics   Alcohol use: Yes    Alcohol/week: 4.0 standard drinks    Types: 4 Glasses of wine per week   Drug use: No    Home Medications Prior to Admission medications   Medication Sig Start Date End Date Taking? Authorizing Provider  albuterol (PROVENTIL) (2.5 MG/3ML) 0.083% nebulizer solution USE 1 VIAL VIA NEBULIZER EVERY 6 HOURS AS NEEDED FOR WHEEZING OR SHORTNESS OF BREATH 06/25/21   Collene Gobble, MD  albuterol (VENTOLIN HFA) 108 (90 Base) MCG/ACT inhaler inhale 2 puffs by mouth every 4 hours if needed 01/27/21   Collene Gobble, MD  azithromycin (ZITHROMAX) 250 MG tablet Take 2 tablets first day then 1 tablet daily until finished. 06/23/21   Collene Gobble, MD  Calcium Carbonate-Vitamin D 600-200 MG-UNIT TABS 2 tablets    [provider]  Calcium Citrate (CITRACAL PO) Take 1 tablet by mouth daily at 8 pm.     [provider]  Cholecalciferol (VITAMIN D) 2000 units  CAPS Take 1 capsule by mouth daily.    [provider]  conjugated estrogens (PREMARIN) vaginal cream APPLY 1/4 APPLICATOR TWICE WEEKLY VAGINALLY AND A SMALL AMOUNT ON THE VULVA AS NEEDED 08/11/20   Princess Bruins, MD  EPINEPHrine 0.3 mg/0.3 mL IJ SOAJ injection Inject 0.3 mg into the muscle as needed for anaphylaxis. 09/23/20   Collene Gobble, MD  famotidine (PEPCID) 20 MG tablet Take 20 mg by mouth as needed.     [provider]  fexofenadine (ALLEGRA) 180 MG tablet Take 180 mg by mouth daily.    [provider]  fluticasone (FLONASE) 50 MCG/ACT nasal spray Place 2 sprays into both nostrils daily.     [provider]  fluticasone furoate-vilanterol (BREO ELLIPTA) 100-25 MCG/ACT AEPB Inhale 1 puff into the lungs daily. 06/25/21   Parrett, Fonnie Mu, NP  meloxicam (MOBIC) 15 MG tablet Take 1 tablet by mouth daily. 07/07/20   [provider]  Multiple Vitamin (MULTIVITAMIN) capsule Take 1 capsule by mouth daily.    [provider]  omalizumab Arvid Right) 75 MG/0.5ML prefilled syringe Inject 75mg  every 21 days IN ADDITION TO one 150mg  syringe. (TOTAL DOSE of 225 mg every 21 days). Deliver to patient's home for self-admin 09/23/20   Collene Gobble, MD  omeprazole (PRILOSEC) 40 MG capsule Take 40 mg by mouth daily.    [provider]  pimecrolimus (ELIDEL) 1 % cream Apply 1 application topically 2 (two) times daily.    [provider]  predniSONE (DELTASONE) 10 MG tablet Take 4 tablets (40 mg total) by mouth daily with breakfast for 3 days, THEN 3 tablets (30 mg total) daily with breakfast for 3 days, THEN 2 tablets (20 mg total) daily with breakfast for 3 days, THEN 1 tablet (10 mg total) daily with breakfast for 3 days. 06/23/21 07/05/21  Collene Gobble, MD  Respiratory Therapy Supplies (FLUTTER) DEVI Use as directed 06/15/17   Juanito Doom, MD  sodium chloride HYPERTONIC 3 % nebulizer solution Take by nebulization 2 (two) times  daily. 06/25/21   Collene Gobble, MD  Spacer/Aero-Holding Chambers (AEROCHAMBER MV) inhaler Use as instructed 09/16/13   Juanito Doom, MD    Allergies    Molds & smuts, Dexilant [dexlansoprazole], and Protonix [pantoprazole sodium]  Review of Systems   Review of Systems  Constitutional:  Negative  for chills and fever.  Eyes:  Negative for pain and visual disturbance.  Respiratory:  Positive for chest tightness and shortness of breath.   Cardiovascular:  Positive for chest pain. Negative for palpitations.  Gastrointestinal:  Negative for abdominal pain and vomiting.  Genitourinary:  Negative for dysuria and hematuria.  Musculoskeletal:  Negative for arthralgias and back pain.  Skin:  Negative for color change and rash.  Neurological:  Negative for syncope and headaches.  All other systems reviewed and are negative.  Physical Exam Updated Vital Signs BP 125/69    Pulse 72    Temp 97.9 F (36.6 C)    Resp 20    Ht 5\' 7"  (1.702 m)    Wt 68.9 kg    SpO2 99%    BMI 23.81 kg/m   Physical Exam Constitutional:      General: She is not in acute distress. HENT:     Head: Normocephalic and atraumatic.  Eyes:     Conjunctiva/sclera: Conjunctivae normal.     Pupils: Pupils are equal, round, and reactive to light.  Cardiovascular:     Rate and Rhythm: Normal rate and regular rhythm.  Pulmonary:     Effort: Pulmonary effort is normal.     Breath sounds: Wheezing present.     Comments: Faint expiratory wheezing bilaterally Rhonchi lower lobes RR 30 Abdominal:     General: There is no distension.     Tenderness: There is no abdominal tenderness.  Skin:    General: Skin is warm and dry.  Neurological:     General: No focal deficit present.     Mental Status: She is alert. Mental status is at baseline.  Psychiatric:        Mood and Affect: Mood normal.        Behavior: Behavior normal.    ED Results / Procedures / Treatments   Labs (all labs ordered are listed, but only  abnormal results are displayed) Labs Reviewed  BASIC METABOLIC PANEL - Abnormal; Notable for the following components:      Result Value   Glucose, Bld 136 (*)    All other components within normal limits  RESP PANEL BY RT-PCR (FLU A&B, COVID) ARPGX2  CBC  D-DIMER, QUANTITATIVE  TROPONIN I (HIGH SENSITIVITY)    EKG None  Radiology DG Chest 2 View  Result Date: 06/25/2021 CLINICAL DATA:  Cough EXAM: CHEST - 2 VIEW COMPARISON:  June 08, 2017 FINDINGS: The heart size and mediastinal contours are within normal limits. Both lungs are clear. The visualized skeletal structures are unremarkable. IMPRESSION: No active cardiopulmonary disease. Electronically Signed   By: Abelardo Diesel M.D.   On: 06/25/2021 10:37    Procedures Procedures   Medications Ordered in ED Medications  albuterol (PROVENTIL,VENTOLIN) solution continuous neb (has no administration in time range)  magnesium sulfate IVPB 2 g 50 mL (has no administration in time range)  methylPREDNISolone sodium succinate (SOLU-MEDROL) 125 mg/2 mL injection 125 mg (has no administration in time range)    ED Course  I have reviewed the triage vital signs and the nursing notes.  Pertinent labs & imaging results that were available during my care of the patient were reviewed by me and considered in my medical decision making (see chart for details).  Patient is presenting with shortness of breath.  Differential diagnosis is broad and includes bronchopneumonia versus pneumonia versus viral infection versus asthma exacerbation versus other.  We will check a COVID and flu PCR here.  I personally  reviewed her outpatient work-up including her pulmonology office visit today and her prior pulmonology records.  I reviewed and interpreted her labs today, including negative D-dimer, undetectable troponin, normal white blood cell count, BMP is unremarkable.  Lower suspicion at this time for pulmonary embolism with a negative D-dimer.  Doubt this  is ACS.  No signs of symptomatic anemia.  Given the complexity of her prior lung disease, I felt a CT scan without contrast of the lungs would be reasonable to compare to prior imaging Dec 2021, for possible large mucous plug or underlying infection.  I would initiate treatment with albuterol, higher dose steroids to 25 mg, as well as magnesium for possible bronchospasm.  3:30 pm - pt signed out to Dr Rogene Houston EDP pending f/u on CT scan and reassessment after breathing treatments.  She is in stable condition receiving nebulizer treatments.        Final Clinical Impression(s) / ED Diagnoses Final diagnoses:  None    Rx / DC Orders ED Discharge Orders     None        Brenten Janney, Carola Rhine, MD 06/26/21 [phone]

## 2021-06-25 NOTE — Assessment & Plan Note (Signed)
Severe asthma exacerbation with suspected initial influenza.  However flu test today in office is negative. Patient has started on antibiotics and prednisone x2 days without significant provement.  Today in the office she is short of breath and continues to have ongoing wheezing. Patient has been advised to go to the emergency room for further evaluation and treatment options also in the differential is possible PE as she has significant acute shortness of breath.  She had recent COVID 3 months ago and is on hormone replacement. Patient's husband is with her and will drive her to the emergency room. Patient is advised if she is discharged recommend that she change her Flovent over to Hydro.  Finish up her antibiotics and prednisone taper if not in conflict with emergency room recommendations. May use her albuterol and albuterol nebulizer as needed Stat D Dimer was ordered .   Plan  Patient Instructions  Go to ER for evaluation .  Begin BREO 1 puff daily.  Stop Flovent .  Albuterol inhaler or neb As needed   Finish Zpack and Prednisone taper as directed.  Follow up in 1 week with Dr. Lamonte Sakai  and As needed   Please contact office for sooner follow up if symptoms do not improve or worsen or seek emergency care

## 2021-06-25 NOTE — Addendum Note (Signed)
Addended by: Vanessa Barbara on: 06/25/2021 12:25 PM   Modules accepted: Orders

## 2021-06-25 NOTE — Patient Instructions (Signed)
Go to ER for evaluation .  Begin BREO 1 puff daily.  Stop Flovent .  Albuterol inhaler or neb As needed   Finish Zpack and Prednisone taper as directed.  Follow up in 1 week with Dr. Lamonte Sakai  and As needed   Please contact office for sooner follow up if symptoms do not improve or worsen or seek emergency care

## 2021-06-25 NOTE — Telephone Encounter (Signed)
Primary Pulmonologist: RB Last office visit and with whom: 05/04/21 with RB What do we see them for (pulmonary problems): asthma/bronchiectasis Last OV assessment/plan:  Assessment & Plan:  Asthma Doing very well clinically.  We have been weaning down her Xolair frequency, tolerated going to every 4 weeks.  We will plan to come off Xolair now and see if she has any recurrence of either asthma or allergy symptoms.  Continue Flovent, albuterol as needed.  Continue to manage her allergies as below.  We will likely recheck an eosinophil count and IgE to establish a posttreatment baseline at her next visit.   Allergic rhinitis Allegra, fluticasone nasal spray   Bronchiectasis without complication (HCC) Subtle bronchiectatic change and micronodular disease on CT chest.  Asymptomatic.  Planning to repeat a surveillance CT chest in December 2022.  Was appointment offered to patient (explain)?  Pt wants recommendations   Reason for call: Called and spoke with pt who states that when she goes to lie down at night, she has a lot of congestion in her chest.  She states when she uses her flutter valve, the phlegm in her chest will "gurgle."  Pt states that the phlegm is clear in color. Pt said that she was not able to lie down overnight 12/29 due to her cough and congestion.  Pt had a temp about 10 days ago of 100.0 but states that she has not had one since (has not checked temp but states that she knows she does not have a fever).  Pt denies any complaints of wheezing but states that she does have increased SOB.  Pt is having to use her albuterol inhaler every 4 hours to help with her symptoms.  Dr. Lamonte Sakai called pt in zpak and prednisone taper 2 days ago which pt has been taking but with the weekend coming up, pt stated that she did not feel comfortable going through the weekend feeling like she currently did.  Pt wants to know if there is anything we could recommend to help with her symptoms. If  needed, please refer to prior phone note from 12/28 when pt first called the office.  Tammy, please advise on this for pt.    Allergies  Allergen Reactions   Molds & Smuts Shortness Of Breath   Dexilant [Dexlansoprazole] Anxiety   Protonix [Pantoprazole Sodium] Anxiety    Immunization History  Administered Date(s) Administered   Influenza Split 04/02/2010, 06/29/2012, 04/19/2013, 04/19/2014, 05/10/2015, 04/11/2016, 04/23/2016, 04/22/2018   Influenza Whole 03/27/2012, 04/29/2014   Influenza,inj,Quad PF,6+ Mos 04/12/2017, 04/22/2018, 03/12/2019, 03/24/2020   Influenza-Unspecified 04/27/2021   PFIZER(Purple Top)SARS-COV-2 Vaccination 09/06/2019, 09/27/2019, 05/13/2020   Pneumococcal Conjugate-13 01/21/2015   Pneumococcal Polysaccharide-23 10/15/2012   Tdap 05/11/2017

## 2021-06-25 NOTE — Discharge Instructions (Addendum)
Finish out your azithromycin Z-Pak.  Start the [place].  Be careful to avoid any strenuous activity while on that.  CT shows evidence of what appears to be pneumonia right middle lobe and left lingular lobe.  This could be improving but is hard to tell so that the Z-Pak may be helping.  But either way start the Levaquin take it for 5 days as directed.  Take all your other medications including your steroid pack.  And make an appointment to follow back up with pulmonary medicine.  Return for any new or worse symptoms

## 2021-06-25 NOTE — Progress Notes (Signed)
@Patient  ID: Kathryn Griffin, female    DOB: [birth year], 65 y.o.   MRN: [id number]  Chief Complaint  Patient presents with   Follow-up    Referring provider: Glenis Smoker, *  HPI: 65 year old female followed for severe persistent asthma, eosinophilia with suspected history of ABPA (treated) and bronchiectasis COVID-19 infection in September 2022  TEST/EVENTS :  Bronchoscopy in 04/2013 showed mucus plugging/cast LUL. Bronch cultures grew aspergillus species.  Methacholine challenge 01/2013 negative.   gE low/normal 2014.  Eosinophil elevated one time in early 2014.   She was treated with itraconazole for a month and did well by January 2015.   04/2013 bronch > large left upper lobe mucus plug/cast causing left upper lobe collapse 04/2013 bronch culture> aspergillus species 05/2013 IgE 156 05/2013 Apergillus precipitans > low positive 07/2013 sputum culture > negative 08/2013 sputum culture > aspergillus sp 01/2014 sputum culture > aspergillus sp   Labs: 09/2016 absolute eosinophil 200 cell/dL, IgE 227    started Xolair in 08/2017   06/25/2021 Acute OV: Asthma Patient presents for an acute office visit.  She complains over the last week that she has had cough, congestion, wheezing and shortness of breath.  She did have a fever 10 days ago but this resolved then 1 week ago she had a direct exposure with her daughter who tested positive for the flu and shortly after she started having symptoms.  She did not test for flu at home.  Today in the office her flu test is negative but she has been sick for over 7 days.  Patient complains of ongoing cough congestion wheezing and rattling cough.  She denies any hemoptysis, chest pain, orthopnea, calf pain.  Appetite is slightly decreased.  Patient says she is been doing exceptionally well with her asthma and has been off of her Xolair for over 2 months.  She remains on Flovent twice daily. Patient was called in a Z-Pak and  prednisone taper 2 days ago.  She says it may have helped some.  She has barely much been in the bed for the last week until yesterday and today.  Patient says that she is most worried that her shortness of breath is not getting any better and her activity tolerance is very low.  She says she was unable to lie flat last night because of the dyspnea.  She is very short of breath today in the office.  She did have COVID-19 infection 3 months ago.  She is on hormone replacement.  She has no history of VTE. She has had a lot of family stress with her son who was recently been ill. She says yesterday her oxygen level was decreased at 88% briefly and then went up into the low 90s with some deep breathing.  Today in the office O2 saturations are 95% on room air. Chest x-ray today in the office is clear with no acute process noted  Allergies  Allergen Reactions   Molds & Smuts Shortness Of Breath   Dexilant [Dexlansoprazole] Anxiety   Protonix [Pantoprazole Sodium] Anxiety    Immunization History  Administered Date(s) Administered   Influenza Split 04/02/2010, 06/29/2012, 04/19/2013, 04/19/2014, 05/10/2015, 04/11/2016, 04/23/2016, 04/22/2018   Influenza Whole 03/27/2012, 04/29/2014   Influenza,inj,Quad PF,6+ Mos 04/12/2017, 04/22/2018, 03/12/2019, 03/24/2020   Influenza-Unspecified 04/27/2021   PFIZER(Purple Top)SARS-COV-2 Vaccination 09/06/2019, 09/27/2019, 05/13/2020   Pneumococcal Conjugate-13 01/21/2015   Pneumococcal Polysaccharide-23 10/15/2012   Tdap 05/11/2017    Past Medical History:  Diagnosis Date   Aspergillus fumigatus (  [place]) 11/14   Hx of gestational diabetes mellitus, not currently pregnant    Osteopenia 07/04/2017   T score -2.1 FRAX 9.6% / 1.3%   Prediabetes    Reflux    Vaginal atrophy    Vitamin D deficiency    Vitiligo     Tobacco History: Social History   Tobacco Use  Smoking Status Never  Smokeless Tobacco Never   Counseling given: Not Answered   Outpatient  Medications Prior to Visit  Medication Sig Dispense Refill   albuterol (PROVENTIL) (2.5 MG/3ML) 0.083% nebulizer solution USE 1 VIAL VIA NEBULIZER EVERY 6 HOURS AS NEEDED FOR WHEEZING OR SHORTNESS OF BREATH 360 mL 11   albuterol (VENTOLIN HFA) 108 (90 Base) MCG/ACT inhaler inhale 2 puffs by mouth every 4 hours if needed 18 g 5   azithromycin (ZITHROMAX) 250 MG tablet Take 2 tablets first day then 1 tablet daily until finished. 6 tablet 0   Calcium Carbonate-Vitamin D 600-200 MG-UNIT TABS 2 tablets     Calcium Citrate (CITRACAL PO) Take 1 tablet by mouth daily at 8 pm.      Cholecalciferol (VITAMIN D) 2000 units CAPS Take 1 capsule by mouth daily.     conjugated estrogens (PREMARIN) vaginal cream APPLY 1/4 APPLICATOR TWICE WEEKLY VAGINALLY AND A SMALL AMOUNT ON THE VULVA AS NEEDED 30 g 4   EPINEPHrine 0.3 mg/0.3 mL IJ SOAJ injection Inject 0.3 mg into the muscle as needed for anaphylaxis. 1 each 2   famotidine (PEPCID) 20 MG tablet Take 20 mg by mouth as needed.      fexofenadine (ALLEGRA) 180 MG tablet Take 180 mg by mouth daily.     fluticasone (FLONASE) 50 MCG/ACT nasal spray Place 2 sprays into both nostrils daily.      meloxicam (MOBIC) 15 MG tablet Take 1 tablet by mouth daily.     Multiple Vitamin (MULTIVITAMIN) capsule Take 1 capsule by mouth daily.     omalizumab Arvid Right) 75 MG/0.5ML prefilled syringe Inject 75mg  every 21 days IN ADDITION TO one 150mg  syringe. (TOTAL DOSE of 225 mg every 21 days). Deliver to patient's home for self-admin 0.5 mL 5   omeprazole (PRILOSEC) 40 MG capsule Take 40 mg by mouth daily.     pimecrolimus (ELIDEL) 1 % cream Apply 1 application topically 2 (two) times daily.     predniSONE (DELTASONE) 10 MG tablet Take 4 tablets (40 mg total) by mouth daily with breakfast for 3 days, THEN 3 tablets (30 mg total) daily with breakfast for 3 days, THEN 2 tablets (20 mg total) daily with breakfast for 3 days, THEN 1 tablet (10 mg total) daily with breakfast for 3 days. 30  tablet 0   Respiratory Therapy Supplies (FLUTTER) DEVI Use as directed 1 each 0   sodium chloride HYPERTONIC 3 % nebulizer solution Take by nebulization 2 (two) times daily. 750 mL 11   Spacer/Aero-Holding Chambers (AEROCHAMBER MV) inhaler Use as instructed 1 each 0   FLOVENT HFA 220 MCG/ACT inhaler INHALE 2 PUFFS INTO THE LUNGS IN THE MORNING AND AT BEDTIME 12 g 3   fluticasone (FLOVENT HFA) 110 MCG/ACT inhaler Inhale into the lungs 2 (two) times daily.     doxycycline (MONODOX) 100 MG capsule 1 capsule (Patient not taking: Reported on 06/25/2021)     mupirocin ointment (BACTROBAN) 2 % Apply topically 3 (three) times daily. (Patient not taking: Reported on 06/25/2021)     omalizumab Arvid Right) 150 MG/ML prefilled syringe Inject 150mg  every 21 days IN ADDITION TO one  75mg  syringe. (TOTAL DOSE of 225 mg every 21 days). Deliver to patient's home for self-admin (Patient not taking: Reported on 06/25/2021) 1 mL 5   oseltamivir (TAMIFLU) 75 MG capsule Take 75 mg by mouth 2 (two) times daily. (Patient not taking: Reported on 06/25/2021)     Facility-Administered Medications Prior to Visit  Medication Dose Route Frequency Provider Last Rate Last Admin   omalizumab Arvid Right) injection 225 mg  225 mg Subcutaneous Q14 Days Simonne Maffucci B, MD   225 mg at 08/06/18 1446   omalizumab Arvid Right) injection 225 mg  225 mg Subcutaneous Q21 days Simonne Maffucci B, MD   225 mg at 01/07/19 1508   omalizumab (XOLAIR) injection 225 mg  225 mg Subcutaneous Q14 Days Simonne Maffucci B, MD   225 mg at 02/18/19 1436   omalizumab Arvid Right) injection 225 mg  225 mg Subcutaneous Q14 Days Simonne Maffucci B, MD   225 mg at 05/15/19 1425   omalizumab Arvid Right) injection 225 mg  225 mg Subcutaneous Q14 Days Collene Gobble, MD   225 mg at 10/08/19 1504   omalizumab (XOLAIR) injection 225 mg  225 mg Subcutaneous Q14 Days Collene Gobble, MD   225 mg at 11/20/19 1500   omalizumab Arvid Right) injection 300 mg  300 mg Subcutaneous Q14  Days Simonne Maffucci B, MD   300 mg at 03/11/19 1355   omalizumab Arvid Right) injection 300 mg  300 mg Subcutaneous Q14 Days Collene Gobble, MD   300 mg at 01/28/20 1432     Review of Systems:   Constitutional:   No  weight loss, night sweats,  Fevers, chills, fatigue, or  lassitude.  HEENT:   No headaches,  Difficulty swallowing,  Tooth/dental problems, or  Sore throat,                No sneezing, itching, ear ache,  +nasal congestion, post nasal drip,   CV:  No chest pain,  Orthopnea, PND, swelling in lower extremities, anasarca, dizziness, palpitations, syncope.   GI  No heartburn, indigestion, abdominal pain, nausea, vomiting, diarrhea, change in bowel habits, loss of appetite, bloody stools.   Resp: .  No chest wall deformity  Skin: no rash or lesions.  GU: no dysuria, change in color of urine, no urgency or frequency.  No flank pain, no hematuria   MS:  No joint pain or swelling.  No decreased range of motion.  No back pain.    Physical Exam  BP 110/70 (BP Location: Left Arm, Patient Position: Sitting, Cuff Size: Normal)    Pulse 93    Temp 98.2 F (36.8 C) (Oral)    Ht 5' 7.5" (1.715 m)    Wt 155 lb 12.8 oz (70.7 kg)    SpO2 95%    BMI 24.04 kg/m   GEN: A/Ox3; pleasant , NAD, well nourished    HEENT:  [place]/AT,  EACs-clear, TMs-wnl, NOSE-clear, THROAT-clear, no lesions, no postnasal drip or exudate noted.   NECK:  Supple w/ fair ROM; no JVD; normal carotid impulses w/o bruits; no thyromegaly or nodules palpated; no lymphadenopathy.    RESP expiratory wheezes bilaterally , no accessory muscle use, no dullness to percussion  CARD:  RRR, no m/r/g, no peripheral edema, pulses intact, no cyanosis or clubbing.  GI:   Soft & nt; nml bowel sounds; no organomegaly or masses detected.   Musco: Warm bil, no deformities or joint swelling noted.   Neuro: alert, no focal deficits noted.    Skin: Warm, no  lesions or rashes    Lab Results:  CBC   BNP No results found  for: BNP  ProBNP No results found for: PROBNP  Imaging: DG Chest 2 View  Result Date: 06/25/2021 CLINICAL DATA:  Cough EXAM: CHEST - 2 VIEW COMPARISON:  June 08, 2017 FINDINGS: The heart size and mediastinal contours are within normal limits. Both lungs are clear. The visualized skeletal structures are unremarkable. IMPRESSION: No active cardiopulmonary disease. Electronically Signed   By: Abelardo Diesel M.D.   On: 06/25/2021 10:37      No flowsheet data found.  No results found for: NITRICOXIDE      Assessment & Plan:   Asthma Severe asthma exacerbation with suspected initial influenza.  However flu test today in office is negative. Patient has started on antibiotics and prednisone x2 days without significant provement.  Today in the office she is short of breath and continues to have ongoing wheezing. Patient has been advised to go to the emergency room for further evaluation and treatment options also in the differential is possible PE as she has significant acute shortness of breath.  She had recent COVID 3 months ago and is on hormone replacement. Patient's husband is with her and will drive her to the emergency room. Patient is advised if she is discharged recommend that she change her Flovent over to [place].  Finish up her antibiotics and prednisone taper if not in conflict with emergency room recommendations. May use her albuterol and albuterol nebulizer as needed Stat D Dimer was ordered .   Plan  Patient Instructions  Go to ER for evaluation .  Begin BREO 1 puff daily.  Stop Flovent .  Albuterol inhaler or neb As needed   Finish Zpack and Prednisone taper as directed.  Follow up in 1 week with Dr. Lamonte Sakai  and As needed   Please contact office for sooner follow up if symptoms do not improve or worsen or seek emergency care        I spent  40  minutes dedicated to the care of this patient on the date of this encounter to include pre-visit review of records,  face-to-face time with the patient discussing conditions above, post visit ordering of testing, clinical documentation with the electronic health record, making appropriate referrals as documented, and communicating necessary findings to members of the patients care team.    Rexene Edison, NP 06/25/2021

## 2021-07-01 ENCOUNTER — Encounter: Payer: Self-pay | Admitting: Emergency Medicine

## 2021-07-01 ENCOUNTER — Other Ambulatory Visit: Payer: Self-pay

## 2021-07-01 ENCOUNTER — Ambulatory Visit (INDEPENDENT_AMBULATORY_CARE_PROVIDER_SITE_OTHER): Payer: 59 | Admitting: Emergency Medicine

## 2021-07-01 DIAGNOSIS — J4551 Severe persistent asthma with (acute) exacerbation: Secondary | ICD-10-CM | POA: Diagnosis not present

## 2021-07-01 DIAGNOSIS — J309 Allergic rhinitis, unspecified: Secondary | ICD-10-CM | POA: Diagnosis not present

## 2021-07-01 DIAGNOSIS — J479 Bronchiectasis, uncomplicated: Secondary | ICD-10-CM

## 2021-07-01 NOTE — Patient Instructions (Addendum)
We will plan to stop Breo for now Go back to your Flovent 2 puffs twice a day.  Rinse and gargle after using. Keep your albuterol available to use 2 puffs when you needed for shortness of breath, chest tightness, wheezing. Continue Allegra and Flonase. Follow Dr. Lamonte Sakai in 3 months or sooner if you have any problems.  We will talk about the timing of any repeat CT scan of your chest at that time.

## 2021-07-01 NOTE — Assessment & Plan Note (Signed)
Recent significant flare following a URI.  Improving with prednisone and azithromycin.  Her CT scan of the chest showed stable bronchiectatic change but with some increased scattered inflammation noted in the right middle lobe and lingula.  I think will be reasonable to consider repeating her CT chest early.  I will talk with her about the timing at her next visit.

## 2021-07-01 NOTE — Progress Notes (Signed)
° °  Subjective:    Patient ID: Kathryn Griffin, female    DOB: [birth year], 66 y.o.   MRN: [id number]  HPI  ROV 07/01/21 --Kathryn Griffin is 65, has a history of severe persistent asthma with eosinophilic phenotype and bronchiectasis, suspected ABPA (treated).  We had been managing her on Xolair, currently off.  Also Flovent.  She had an acute flare at the end of December treated with azithromycin, prednisone.  Her Flovent was changed to [facility].  She was directed to the ED due to the severity of her flare.  Chest x-ray and CT chest as below. She is feeling better, still has some paroxysmal cough.   Chest x-ray 06/25/2021 reviewed by me shows no infiltrates CT chest 06/25/2021 reviewed by me shows bronchiectasis, some very subtle patchy bronchial wall thickening and inflammatory change in the right middle lobe, lingula.   Review of Systems As per HPI     Objective:   Physical Exam Vitals:   07/01/21 0903  BP: 116/70  Pulse: (!) 101  Temp: 97.8 F (36.6 C)  TempSrc: Oral  SpO2: 96%  Weight: 155 lb 3.2 oz (70.4 kg)  Height: 5' 7.5" (1.715 m)   Gen: Pleasant, well-nourished, in no distress,  normal affect  ENT: No lesions,  mouth clear,  oropharynx clear, no postnasal drip  Neck: No JVD, no stridor  Lungs: No use of accessory muscles, few inspiratory squeaks, crackles, no wheezes.  Cardiovascular: RRR, heart sounds normal, no murmur or gallops, no peripheral edema  Musculoskeletal: No deformities, no cyanosis or clubbing  Neuro: alert, awake, non focal  Skin: Warm, no lesions or rash      Assessment & Plan:  Bronchiectasis without complication (HCC) Recent significant flare following a URI.  Improving with prednisone and azithromycin.  Her CT scan of the chest showed stable bronchiectatic change but with some increased scattered inflammation noted in the right middle lobe and lingula.  I think will be reasonable to consider repeating her CT chest early.  I will talk with her  about the timing at her next visit.  Asthma Will transition her Breo back to Flovent and see how she tolerates.  She is doing okay off of Xolair until this flare.  Continue to manage her rhinitis  Allergic rhinitis Allegra, nasal steroid.    Baltazar Apo, MD, PhD 07/01/2021, 9:30 AM [facility] [phone] or if no answer [phone]

## 2021-07-01 NOTE — Assessment & Plan Note (Signed)
Will transition her Breo back to Flovent and see how she tolerates.  She is doing okay off of Xolair until this flare.  Continue to manage her rhinitis

## 2021-07-01 NOTE — Assessment & Plan Note (Signed)
Allegra, nasal steroid.

## 2021-07-06 ENCOUNTER — Ambulatory Visit: Payer: 59 | Admitting: Adult Health

## 2021-07-12 ENCOUNTER — Telehealth: Payer: Self-pay | Admitting: Emergency Medicine

## 2021-07-12 MED ORDER — SODIUM CHLORIDE 3 % IN NEBU: INHALATION_SOLUTION | Freq: Two times a day (BID) | RESPIRATORY_TRACT | 11 refills | Status: DC

## 2021-07-12 NOTE — Telephone Encounter (Signed)
Called and spoke with pt letting her know that a refill of her sodium chloride sol was sent to pharmacy for her 12/30. Pt said she never heard from pharmacy about the Rx being ready. Stated to her that I would resend Rx to pharmacy for her and she verbalized understanding. Rx resent. Nothing further needed.

## 2021-07-20 ENCOUNTER — Ambulatory Visit (INDEPENDENT_AMBULATORY_CARE_PROVIDER_SITE_OTHER): Payer: 59 | Admitting: Internal Medicine

## 2021-07-20 ENCOUNTER — Other Ambulatory Visit: Payer: Self-pay

## 2021-07-20 ENCOUNTER — Encounter: Payer: Self-pay | Admitting: Internal Medicine

## 2021-07-20 VITALS — BP 120/70 | HR 82 | Ht 67.5 in | Wt 159.4 lb

## 2021-07-20 DIAGNOSIS — R079 Chest pain, unspecified: Secondary | ICD-10-CM

## 2021-07-20 NOTE — Progress Notes (Signed)
Cardiology Office Note   Date:  07/20/2021   ID:  Kathryn Griffin, Kathryn Griffin [birth year], MRN [id number]  PCP:  Glenis Smoker, MD  Cardiologist:   Dorris Carnes, MD   Pt presents for evaluation of SOB      History of Present Illness: Kathryn Griffin is a 66 y.o. female with a history of alletrgic bronchopumonary aspergillosis and gestational DM   Seen by [facility] in 2019  for SOB   Pt had a TTE   Mild LVH   LVEF 55 to 60%.  Normal RV function    In March 2019 had dobutamine stress echo that was normal      The pt had a CT done recently for interstitial lung dz   Scan showed scattered coronary calcifications   Calcifications noted on scans back to 2018 The pt denies CP   Breathing is stable    No change in ability to do things    Diet:      Br   Oatmeal/furit/chia   coffee with collagen Lunch:   Salad or lean cuisine cauliflower  Dinner   Salmon, sweet potato, Drink  Water   Small diet coke      Current Meds  Medication Sig   albuterol (PROVENTIL) (2.5 MG/3ML) 0.083% nebulizer solution USE 1 VIAL VIA NEBULIZER EVERY 6 HOURS AS NEEDED FOR WHEEZING OR SHORTNESS OF BREATH   albuterol (VENTOLIN HFA) 108 (90 Base) MCG/ACT inhaler inhale 2 puffs by mouth every 4 hours if needed   Calcium Carbonate-Vitamin D 600-200 MG-UNIT TABS 2 tablets   Calcium Citrate (CITRACAL PO) Take 1 tablet by mouth daily at 8 pm.    Cholecalciferol (VITAMIN D) 2000 units CAPS Take 1 capsule by mouth daily.   conjugated estrogens (PREMARIN) vaginal cream APPLY 1/4 APPLICATOR TWICE WEEKLY VAGINALLY AND A SMALL AMOUNT ON THE VULVA AS NEEDED   EPINEPHrine 0.3 mg/0.3 mL IJ SOAJ injection Inject 0.3 mg into the muscle as needed for anaphylaxis.   famotidine (PEPCID) 20 MG tablet Take 20 mg by mouth as needed.    fexofenadine (ALLEGRA) 180 MG tablet Take 180 mg by mouth daily.   fluticasone (FLONASE) 50 MCG/ACT nasal spray Place 2 sprays into both nostrils daily.    fluticasone (FLOVENT HFA) 220 MCG/ACT  inhaler Inhale 2 puffs into the lungs 2 (two) times daily.   meloxicam (MOBIC) 15 MG tablet Take 1 tablet by mouth daily.   Multiple Vitamin (MULTIVITAMIN) capsule Take 1 capsule by mouth daily.   omeprazole (PRILOSEC) 40 MG capsule Take 40 mg by mouth daily.   pimecrolimus (ELIDEL) 1 % cream Apply 1 application topically 2 (two) times daily.   Respiratory Therapy Supplies (FLUTTER) DEVI Use as directed   Ruxolitinib Phosphate (OPZELURA EX) Apply 1 application topically in the morning and at bedtime.   sodium chloride HYPERTONIC 3 % nebulizer solution Take by nebulization 2 (two) times daily.   Spacer/Aero-Holding Chambers (AEROCHAMBER MV) inhaler Use as instructed   Current Facility-Administered Medications for the 07/20/21 encounter (Office Visit) with Fay Records, MD  Medication   omalizumab Arvid Right) injection 225 mg   omalizumab Arvid Right) injection 225 mg   omalizumab Arvid Right) injection 225 mg   omalizumab Arvid Right) injection 225 mg   omalizumab Arvid Right) injection 225 mg   omalizumab Arvid Right) injection 225 mg   omalizumab Arvid Right) injection 300 mg   omalizumab Arvid Right) injection 300 mg     Allergies:   Molds & smuts, Dexilant [dexlansoprazole], and Protonix [pantoprazole sodium]  Past Medical History:  Diagnosis Date   Aspergillus fumigatus ([place]) 04/2013   Asthma    Hx of gestational diabetes mellitus, not currently pregnant    Osteopenia 07/04/2017   T score -2.1 FRAX 9.6% / 1.3%   Prediabetes    Reflux    Vaginal atrophy    Vitamin D deficiency    Vitiligo     Past Surgical History:  Procedure Laterality Date   BREAST CYST EXCISION  1990   COLPOSCOPY  2009   benign   TUBAL LIGATION  1995     Social History:  The patient  reports that she has never smoked. She has never used smokeless tobacco. She reports current alcohol use of about 4.0 standard drinks per week. She reports that she does not use drugs.   Family History:  The patient's family history includes  COPD in her father; Cancer in her father and mother; Diabetes in her brother and paternal grandmother; Heart attack in her maternal grandmother; Hypertension in her father, maternal grandmother, and mother; Ovarian cancer in her maternal grandmother; Prostate cancer in her father.    ROS:  Please see the history of present illness. All other systems are reviewed and  Negative to the above problem except as noted.    PHYSICAL EXAM: VS:  BP 120/70    Pulse 82    Ht 5' 7.5" (1.715 m)    Wt 159 lb 6.4 oz (72.3 kg)    SpO2 96%    BMI 24.60 kg/m   GEN: Well nourished, well developed, in no acute distress  HEENT: normal  Neck: no JVD, carotid bruits Cardiac: RRR; no murmurs,  No LE edema  Respiratory:  clear to auscultation bilaterally GI: soft, nontender, nondistended, + BS  No hepatomegaly  MS: no deformity Moving all extremities   Skin: warm and dry, no rash Neuro:  Strength and sensation are intact Psych: euthymic mood, full affect   EKG:  EKG is ordered today.   SR 82 bpm   Lipid Panel    Component Value Date/Time   CHOL 196 02/19/2015 0842   TRIG 80 02/19/2015 0842   HDL 87 02/19/2015 0842   CHOLHDL 2.3 02/19/2015 0842   VLDL 16 02/19/2015 0842   LDLCALC 93 02/19/2015 0842      Wt Readings from Last 3 Encounters:  07/20/21 159 lb 6.4 oz (72.3 kg)  07/01/21 155 lb 3.2 oz (70.4 kg)  06/25/21 152 lb (68.9 kg)      ASSESSMENT AND PLAN:  1  CAD  pt noted to have CAD on CT scan   [facility] has no anginal symptoms    Review of scan, calcifications appear mild Recomm control of risk factors   2   Lipids  Will set up for lipomed panel   Needs control if elevated  3  Diet  Discussed low carb, low sugar.   Time restricted eating      Will follow up with labs    Follow up in 1 year in clnic   Current medicines are reviewed at length with the patient today.  The patient does not have concerns regarding medicines.  Signed, Dorris Carnes, MD  07/20/2021 9:28 AM    [address], [place], [place]  [address] Phone: [phone]; Fax: [phone]

## 2021-07-20 NOTE — Patient Instructions (Signed)
Medication Instructions:   *If you need a refill on your cardiac medications before your next appointment, please call your pharmacy*   Lab Work: LIPOMED, URIC ACID  If you have labs (blood work) drawn today and your tests are completely normal, you will receive your results only by: [place] (if you have MyChart) OR A paper copy in the mail If you have any lab test that is abnormal or we need to change your treatment, we will call you to review the results.   Testing/Procedures: NONE   Follow-Up: At [facility], you and your health needs are our priority.  As part of our continuing mission to provide you with exceptional heart care, we have created designated Provider Care Teams.  These Care Teams include your primary Cardiologist (physician) and Advanced Practice Providers (APPs -  Physician Assistants and Nurse Practitioners) who all work together to provide you with the care you need, when you need it.  We recommend signing up for the patient portal called "MyChart".  Sign up information is provided on this After Visit Summary.  MyChart is used to connect with patients for Virtual Visits (Telemedicine).  Patients are able to view lab/test results, encounter notes, upcoming appointments, etc.  Non-urgent messages can be sent to your provider as well.   To learn more about what you can do with MyChart, go to [email or web address].    Your next appointment:   1 year(s)  The format for your next appointment:   In Person  Provider:   DR. Dorris Carnes    Other Instructions

## 2021-07-21 ENCOUNTER — Other Ambulatory Visit: Payer: 59 | Admitting: *Deleted

## 2021-07-21 DIAGNOSIS — R079 Chest pain, unspecified: Secondary | ICD-10-CM

## 2021-07-23 LAB — NMR, LIPOPROFILE
Cholesterol, Total: 209 mg/dL — ABNORMAL HIGH (ref 100–199)
HDL Particle Number: 53.7 umol/L (ref >=30.5)
HDL-C: 86 mg/dL (ref >39)
LDL Particle Number: 1252 nmol/L — ABNORMAL HIGH (ref <1000)
LDL Size: 21.7 nm (ref >20.5)
LDL-C (NIH Calc): 110 mg/dL — ABNORMAL HIGH (ref 0–99)
LP-IR Score: <25 (ref <=45)
Small LDL Particle Number: 474 nmol/L (ref <=527)
Triglycerides: 74 mg/dL (ref 0–149)

## 2021-07-23 LAB — APOLIPOPROTEIN B: Apolipoprotein B: 87 mg/dL (ref <90)

## 2021-07-23 LAB — LIPOPROTEIN A (LPA): Lipoprotein (a): 164 nmol/L — ABNORMAL HIGH (ref <75.0)

## 2021-07-23 LAB — URIC ACID: Uric Acid: 3.4 mg/dL (ref 3.0–7.2)

## 2021-07-28 ENCOUNTER — Encounter: Payer: Self-pay | Admitting: Internal Medicine

## 2021-07-28 DIAGNOSIS — R079 Chest pain, unspecified: Secondary | ICD-10-CM

## 2021-07-28 DIAGNOSIS — E785 Hyperlipidemia, unspecified: Secondary | ICD-10-CM

## 2021-07-28 DIAGNOSIS — Z79899 Other long term (current) drug therapy: Secondary | ICD-10-CM

## 2021-07-28 MED ORDER — ROSUVASTATIN CALCIUM 10 MG PO TABS: 10.0000 mg | ORAL_TABLET | Freq: Every day | ORAL | 3 refills | Status: DC

## 2021-07-28 NOTE — Telephone Encounter (Signed)
-----   Message from Fay Records, MD sent at 07/23/2021  3:48 PM EST ----- LDL is 110 but she has higher particle number  Lpa is high at 164 I would recomm getting LDL to below 70   REcom adding Crestor 10 mg    F/U lipomed only with liver panel in 8 wks

## 2021-07-28 NOTE — Telephone Encounter (Signed)
The patient has been notified of the result and verbalized understanding.  All questions (if any) were answered. Stephani Police, RN 07/28/2021 8:54 AM

## 2021-07-29 ENCOUNTER — Encounter: Payer: Self-pay | Admitting: Internal Medicine

## 2021-08-02 ENCOUNTER — Encounter: Payer: Self-pay | Admitting: Internal Medicine

## 2021-08-02 ENCOUNTER — Other Ambulatory Visit: Payer: Self-pay | Admitting: Emergency Medicine

## 2021-08-02 ENCOUNTER — Telehealth: Payer: Self-pay | Admitting: Emergency Medicine

## 2021-08-02 MED ORDER — FLUTICASONE PROPIONATE HFA 220 MCG/ACT IN AERO: 2.0000 | INHALATION_SPRAY | Freq: Two times a day (BID) | RESPIRATORY_TRACT | 11 refills | Status: DC

## 2021-08-02 NOTE — Telephone Encounter (Signed)
Flovent sent to preferred pharmacy.  Patient is aware and voiced her understanding.  Nothing further needed at this time.

## 2021-08-13 ENCOUNTER — Ambulatory Visit: Payer: 59 | Admitting: Obstetrics & Gynecology

## 2021-08-28 ENCOUNTER — Encounter: Payer: Self-pay | Admitting: Internal Medicine

## 2021-08-30 NOTE — Telephone Encounter (Signed)
Stop the rosuvastatin     Please write back in a couple weeks to confirm that pains have gotten better   Will make a decision from there  ?

## 2021-09-06 ENCOUNTER — Encounter: Payer: Self-pay | Admitting: Obstetrics & Gynecology

## 2021-09-06 ENCOUNTER — Ambulatory Visit (INDEPENDENT_AMBULATORY_CARE_PROVIDER_SITE_OTHER): Payer: 59 | Admitting: Obstetrics & Gynecology

## 2021-09-06 ENCOUNTER — Other Ambulatory Visit: Payer: Self-pay

## 2021-09-06 VITALS — BP 110/68 | HR 73 | Ht 68.0 in | Wt 157.0 lb

## 2021-09-06 DIAGNOSIS — Z780 Asymptomatic menopausal state: Secondary | ICD-10-CM

## 2021-09-06 DIAGNOSIS — M8589 Other specified disorders of bone density and structure, multiple sites: Secondary | ICD-10-CM | POA: Diagnosis not present

## 2021-09-06 DIAGNOSIS — Z01419 Encounter for gynecological examination (general) (routine) without abnormal findings: Secondary | ICD-10-CM

## 2021-09-06 DIAGNOSIS — N952 Postmenopausal atrophic vaginitis: Secondary | ICD-10-CM

## 2021-09-06 MED ORDER — ESTRADIOL 10 MCG VA TABS: 1.0000 | ORAL_TABLET | VAGINAL | 4 refills | Status: DC

## 2021-09-06 NOTE — Progress Notes (Signed)
? ? ?Sincerity Cedar [birth year] [id number] ? ? ?History:    66 y.o.  [id number] Married ?  ?RP:  Established patient presenting for annual gyn exam  ?  ?HPI: Postmenopause, well on no systemic HRT.  No PMB.  No pelvic pain.  Using Premarin for vaginal dryness, but finds it very messy. Pap Neg 06/2019, no h/o abnormal Pap. Rarely sexually active currently because of her back and hip issues.  Physical therapy helping her right hip.  Able to walk regularly.  BD 08/2019 Osteopenia T-Score -2.3 at the Spine.  Will repeat a BD here now. Body mass index 23.87.  Breasts normal.  Mammo 01/2021 Neg. Urine and bowel movements normal.  Health labs with family physician.  Colono 3-4 yrs ago.  Mother with Colon Ca.  ? ? ?Past medical history,surgical history, family history and social history were all reviewed and documented in the EPIC chart. ? ?Gynecologic History ?No LMP recorded. Patient is postmenopausal. ? ?Obstetric History ?OB History  ?Gravida Para Term Preterm AB Living  ?'4 2     2 2  '$ ?SAB IAB Ectopic Multiple Live Births  ?2          ?  ?# Outcome Date GA Lbr Len/2nd Weight Sex Delivery Anes PTL Lv  ?4 SAB           ?3 SAB           ?2 Para           ?1 Para           ? ? ? ?ROS: A ROS was performed and pertinent positives and negatives are included in the history. ? GENERAL: No fevers or chills. HEENT: No change in vision, no earache, sore throat or sinus congestion. NECK: No pain or stiffness. CARDIOVASCULAR: No chest pain or pressure. No palpitations. PULMONARY: No shortness of breath, cough or wheeze. GASTROINTESTINAL: No abdominal pain, nausea, vomiting or diarrhea, melena or bright red blood per rectum. GENITOURINARY: No urinary frequency, urgency, hesitancy or dysuria. MUSCULOSKELETAL: No joint or muscle pain, no back pain, no recent trauma. DERMATOLOGIC: No rash, no itching, no lesions. ENDOCRINE: No polyuria, polydipsia, no heat or cold intolerance. No recent change in weight. HEMATOLOGICAL: No anemia or easy  bruising or bleeding. NEUROLOGIC: No headache, seizures, numbness, tingling or weakness. PSYCHIATRIC: No depression, no loss of interest in normal activity or change in sleep pattern.  ?  ? ?Exam: ? ? ?BP 110/68   Pulse 73   Ht '5\' 8"'$  (1.727 m)   Wt 157 lb (71.2 kg)   SpO2 100%   BMI 23.87 kg/m?  ? ?Body mass index is 23.87 kg/m?. ? ?General appearance : Well developed well nourished female. No acute distress ?HEENT: Eyes: no retinal hemorrhage or exudates,  Neck supple, trachea midline, no carotid bruits, no thyroidmegaly ?Lungs: Clear to auscultation, no rhonchi or wheezes, or rib retractions  ?Heart: Regular rate and rhythm, no murmurs or gallops ?Breast:Examined in sitting and supine position were symmetrical in appearance, no palpable masses or tenderness,  no skin retraction, no nipple inversion, no nipple discharge, no skin discoloration, no axillary or supraclavicular lymphadenopathy ?Abdomen: no palpable masses or tenderness, no rebound or guarding ?Extremities: no edema or skin discoloration or tenderness ? ?Pelvic: Vulva: Normal ?            Vagina: No gross lesions or discharge ? Cervix: No gross lesions or discharge ? Uterus  AV, normal size, shape and consistency, non-tender and mobile ? Adnexa  Without masses or tenderness ? Anus: Normal ? ? ?Assessment/Plan:  66 y.o. female for annual exam  ? ?1. Well female exam with routine gynecological exam ?Postmenopause, well on no systemic HRT.  No PMB.  No pelvic pain.  Using Premarin for vaginal dryness, but finds it very messy. Pap Neg 06/2019, no h/o abnormal Pap. Rarely sexually active currently because of her back and hip issues.  Physical therapy helping her right hip.  Able to walk regularly.  BD 08/2019 Osteopenia T-Score -2.3 at the Spine.  Will repeat a BD here now. Body mass index 23.87.  Breasts normal.  Mammo 01/2021 Neg. Urine and bowel movements normal.  Health labs with family physician.  Colono 3-4 yrs ago.  Mother with Colon Ca.  ? ?2.  Postmenopausal ?Postmenopause, well on no systemic HRT.  No PMB.  No pelvic pain ? ?3. Postmenopausal atrophic vaginitis ?Using Premarin for vaginal dryness, but finds it very messy.  Decision to change to the generic of Vagifem 1 tab vaginally twice a week.  Prescription sent to pharmacy. ? ?4. Osteopenia of multiple sites ?BD 08/2019 Osteopenia T-Score -2.3 at the Spine.  Will repeat a BD here now.  Ca++ 1.5 g/d, Vit D and weight bearing physical activities to continue. ?- DG Bone Density; Future ? ?Other orders ?- Estradiol 10 MCG TABS vaginal tablet; Place 1 tablet (10 mcg total) vaginally 2 (two) times a week.  ? ?Princess Bruins MD, 8:46 AM 09/06/2021 ? ?  ?

## 2021-09-13 NOTE — Telephone Encounter (Signed)
Since he notes no improvement I would restart Crestor    ?F/U lipids in 8 wks with liver panel ?

## 2021-09-28 ENCOUNTER — Other Ambulatory Visit: Payer: 59

## 2021-09-29 ENCOUNTER — Ambulatory Visit (INDEPENDENT_AMBULATORY_CARE_PROVIDER_SITE_OTHER): Payer: 59

## 2021-09-29 ENCOUNTER — Encounter: Payer: Self-pay | Admitting: Emergency Medicine

## 2021-09-29 ENCOUNTER — Other Ambulatory Visit: Payer: Self-pay | Admitting: Obstetrics & Gynecology

## 2021-09-29 ENCOUNTER — Ambulatory Visit (INDEPENDENT_AMBULATORY_CARE_PROVIDER_SITE_OTHER): Payer: 59 | Admitting: Emergency Medicine

## 2021-09-29 DIAGNOSIS — J479 Bronchiectasis, uncomplicated: Secondary | ICD-10-CM | POA: Diagnosis not present

## 2021-09-29 DIAGNOSIS — J309 Allergic rhinitis, unspecified: Secondary | ICD-10-CM | POA: Diagnosis not present

## 2021-09-29 DIAGNOSIS — J4551 Severe persistent asthma with (acute) exacerbation: Secondary | ICD-10-CM

## 2021-09-29 DIAGNOSIS — M8589 Other specified disorders of bone density and structure, multiple sites: Secondary | ICD-10-CM

## 2021-09-29 DIAGNOSIS — Z780 Asymptomatic menopausal state: Secondary | ICD-10-CM | POA: Diagnosis not present

## 2021-09-29 LAB — CBC WITH DIFFERENTIAL/PLATELET
Basophils Absolute: 0 10*3/uL (ref 0.0–0.1)
Basophils Relative: 0.1 % (ref 0.0–3.0)
Eosinophils Absolute: 0 10*3/uL (ref 0.0–0.7)
Eosinophils Relative: 0.2 % (ref 0.0–5.0)
HCT: 40.9 % (ref 36.0–46.0)
Hemoglobin: 13.6 g/dL (ref 12.0–15.0)
Lymphocytes Relative: 14.2 % (ref 12.0–46.0)
Lymphs Abs: 1 10*3/uL (ref 0.7–4.0)
MCHC: 33.2 g/dL (ref 30.0–36.0)
MCV: 91.5 fl (ref 78.0–100.0)
Monocytes Absolute: 0.8 10*3/uL (ref 0.1–1.0)
Monocytes Relative: 11.5 % (ref 3.0–12.0)
Neutro Abs: 5 10*3/uL (ref 1.4–7.7)
Neutrophils Relative %: 74 % (ref 43.0–77.0)
Platelets: 351 10*3/uL (ref 150.0–400.0)
RBC: 4.47 Mil/uL (ref 3.87–5.11)
RDW: 13.9 % (ref 11.5–15.5)
WBC: 6.7 10*3/uL (ref 4.0–10.5)

## 2021-09-29 NOTE — Progress Notes (Signed)
? ?  Subjective:  ? ? Patient ID: Kathryn Griffin, female    DOB: May 11, 1956, 66 y.o.   MRN: [id number] ? ?HPI ? ?ROV 07/01/21 --Kathryn Griffin is 35, has a history of severe persistent asthma with eosinophilic phenotype and bronchiectasis, suspected ABPA (treated).  We had been managing her on Xolair, currently off.  Also Flovent.  She had an acute flare at the end of December treated with azithromycin, prednisone.  Her Flovent was changed to [facility].  She was directed to the ED due to the severity of her flare.  Chest x-ray and CT chest as below. ?She is feeling better, still has some paroxysmal cough.  ? ?Chest x-ray 06/25/2021 reviewed by me shows no infiltrates ?CT chest 06/25/2021 reviewed by me shows bronchiectasis, some very subtle patchy bronchial wall thickening and inflammatory change in the right middle lobe, lingula. ? ? ?ROV 09/29/21 --follow-up visit for 66 year old woman with severe persistent asthma, eosinophilic phenotype with associated bronchiectasis.  Suspected ABPA (treated).  She has been on Xolair in the past, not currently - stopped in October 22.  At her last visit we transitioned Breo back to Flovent to see if she would tolerate (was started when she was flaring).  She is on Allegra and a nasal steroid.  Most recent CT chest with some subtle patchy bronchial wall thickening inflammatory change right middle lobe and lingula 05/2021. ?No cough. She is doing very well. Great exercise tolerance.  ? ? ?Review of Systems ?As per HPI ? ?   ?Objective:  ? Physical Exam ?Vitals:  ? 09/29/21 0852  ?BP: 126/74  ?Pulse: 60  ?Temp: 97.6 ?F (36.4 ?C)  ?TempSrc: Oral  ?SpO2: 99%  ?Weight: 150 lb 3.2 oz (68.1 kg)  ?Height: 5' 7.5" (1.715 m)  ? ?Gen: Pleasant, well-nourished, in no distress,  normal affect ? ?ENT: No lesions,  mouth clear,  oropharynx clear, no postnasal drip ? ?Neck: No JVD, no stridor ? ?Lungs: No use of accessory muscles, No crackles, no wheezes. ? ?Cardiovascular: RRR, heart sounds normal, no  murmur or gallops, no peripheral edema ? ?Musculoskeletal: No deformities, no cyanosis or clubbing ? ?Neuro: alert, awake, non focal ? ?Skin: Warm, no lesions or rash ? ? ?   ?Assessment & Plan:  ?Asthma ?We will check her IgE and eosinophil count now that she is at stable baseline, off Xolair for 6 months.  Continue current regimen, doing well ? ?Please continue Flovent 2 puffs twice daily as you have been taking it.  Rinse and gargle after using. ?Keep your albuterol available to use 2 puffs when needed for shortness of breath, chest tightness, wheezing. ?We will check blood work today (IgE, eosinophil count) ?Please follow with Dr. Lamonte Sakai in December after your CT to review or sooner if you have any problems. ? ?Bronchiectasis without complication ([place]) ?We will plan to repeat your CT scan of the chest without contrast in December 2023 to follow airways inflammation ? ?Allergic rhinitis ?Please continue your Allegra and Flonase as you have been taking them. ? ? ? ?Baltazar Apo, MD, PhD ?09/29/2021, 9:17 AM ?[facility] ?[phone] or if no answer [phone] ? ?

## 2021-09-29 NOTE — Assessment & Plan Note (Signed)
Please continue your Allegra and Flonase as you have been taking them. ?

## 2021-09-29 NOTE — Assessment & Plan Note (Signed)
We will plan to repeat your CT scan of the chest without contrast in December 2023 to follow airways inflammation ?

## 2021-09-29 NOTE — Addendum Note (Signed)
Addended by: Gavin Potters R on: 09/29/2021 09:22 AM ? ? Modules accepted: Orders ? ?

## 2021-09-29 NOTE — Assessment & Plan Note (Addendum)
We will check her IgE and eosinophil count now that she is at stable baseline, off Xolair for 6 months.  Continue current regimen, doing well ? ?Please continue Flovent 2 puffs twice daily as you have been taking it.  Rinse and gargle after using. ?Keep your albuterol available to use 2 puffs when needed for shortness of breath, chest tightness, wheezing. ?We will check blood work today (IgE, eosinophil count) ?Please follow with Dr. Lamonte Sakai in December after your CT to review or sooner if you have any problems. ?

## 2021-09-29 NOTE — Patient Instructions (Addendum)
Please continue Flovent 2 puffs twice daily as you have been taking it.  Rinse and gargle after using. ?Keep your albuterol available to use 2 puffs when needed for shortness of breath, chest tightness, wheezing. ?Please continue your Allegra and Flonase as you have been taking them. ?We will check blood work today (IgE, eosinophil count) ?We will plan to repeat your CT scan of the chest without contrast in December 2023 to follow airways inflammation ?Please follow with Dr. Lamonte Sakai in December after your CT to review or sooner if you have any problems. ?

## 2021-09-30 LAB — IGE: IgE (Immunoglobulin E), Serum: 88 kU/L (ref <=114)

## 2021-10-05 ENCOUNTER — Telehealth: Payer: Self-pay | Admitting: *Deleted

## 2021-10-05 NOTE — Telephone Encounter (Signed)
Patient called c/o something protruding out of her vagina. Patient she had noticed it a couple time when trying to insert estradiol vaginal tablet, but now it appears to worsen. Patient reports she is able to urinate,however she is getting up in the middle of night several times to urinate. I advised she schedule an office visit with provider, message sent to appointments to schedule. ?

## 2021-10-06 ENCOUNTER — Encounter: Payer: Self-pay | Admitting: Obstetrics & Gynecology

## 2021-10-06 ENCOUNTER — Ambulatory Visit (INDEPENDENT_AMBULATORY_CARE_PROVIDER_SITE_OTHER): Payer: 59 | Admitting: Obstetrics & Gynecology

## 2021-10-06 VITALS — BP 110/78

## 2021-10-06 DIAGNOSIS — R350 Frequency of micturition: Secondary | ICD-10-CM

## 2021-10-06 DIAGNOSIS — N8110 Cystocele, unspecified: Secondary | ICD-10-CM | POA: Diagnosis not present

## 2021-10-06 DIAGNOSIS — N952 Postmenopausal atrophic vaginitis: Secondary | ICD-10-CM | POA: Diagnosis not present

## 2021-10-06 DIAGNOSIS — Z780 Asymptomatic menopausal state: Secondary | ICD-10-CM | POA: Diagnosis not present

## 2021-10-06 NOTE — Progress Notes (Signed)
? ? ?Kathryn Griffin 08-24-1955 [id number] ? ? ?     66 y.o.  [id number]  Married ? ?RP: Worsening of vaginal bulge and urinary frequency ? ?HPI: Worsening of vaginal bulge.  Feels a bulge when standing.  Blocking the Estradiol tab vaginal applicator.  No pelvic or vaginal pain.  No PMB.  No Urinary incontinence.  C/O urinary frequency, especially at night.  No dysuria, no blood seen in urine, no fever.  BMs normal.  Not sexually active. ? ? ?OB History  ?Gravida Para Term Preterm AB Living  ?'4 2     2 2  '$ ?SAB IAB Ectopic Multiple Live Births  ?2          ?  ?# Outcome Date GA Lbr Len/2nd Weight Sex Delivery Anes PTL Lv  ?4 SAB           ?3 SAB           ?2 Para           ?1 Para           ? ? ?Past medical history,surgical history, problem list, medications, allergies, family history and social history were all reviewed and documented in the EPIC chart. ? ? ?Directed ROS with pertinent positives and negatives documented in the history of present illness/assessment and plan. ? ?Exam: ? ?Vitals:  ? 10/06/21 1057  ?BP: 110/78  ? ?General appearance:  Normal ? ?Gynecologic exam: Vulva normal.  Laying down with valsalva:  Uterus AV, normal volume, mobile, NT with no uterine prolapse.  No rectocele.  Cystocele grade 2-3/4. ?                                                        Standing with valsalva:  Cystocele grade 4/4. ? ?U/A: Yellow, slightly cloudy, protein negative, nitrites negative, white blood cells 0-5, red blood cells negative, bacteria few.  Urine culture pending. ? ? ?Assessment/Plan:  66 y.o. [id number]  ? ?1. Baden-Walker grade 4 cystocele ?Cystocele grade 4 /4.  No uterine prolapse or rectocele.  Counseling on cystocele in terms of diagnosis, prevention of progression and management.  Patient explained the combination of weaker and stretched anterior vaginal wall which produces a bulge especially when the bladder is full.  Recommend avoiding pelvic floor pressure by treating any cough, preventing  constipation and treating it as needed, avoiding heavy lifting and emptying the bladder as much as possible before it is too full.  Kegel exercises recommended.  Patient offered physical therapy but prefers to start with Kegels only at this time.  Counseling on the Pessary management and the eventual option for surgical correction with a cystocele repair and possibly a sling procedure.  If that became indicated or if patient wanted to discuss further, would refer to urogynecology.   ? ?2. Urinary frequency ?Urine analysis only mildly perturbed.  Will wait on urine culture to decide if treatment is indicated. ?- Urinalysis,Complete w/RFL Culture ? ?3. Postmenopausal ?Well on no systemic HRT. ? ?4. Postmenopausal atrophic vaginitis  ?Continue with Estradiol vaginal tablets twice a week.  Recommend laying down after emptying her bladder to facilitate the vaginal insertion with the applicator.   ? ?Counseling on cystocele and pelvic floor weakness, documentation reviewed and management thoroughly discussed including avoidance of pelvic floor pressure, pessary management and the  eventual option of surgical correction for 25 minutes. ? ?Princess Bruins MD, 11:36 AM 10/06/2021 ? ? ? ?  ?

## 2021-10-06 NOTE — Telephone Encounter (Signed)
Patient scheduled on 10/06/21 @ 11:00am ?

## 2021-10-08 LAB — CULTURE INDICATED

## 2021-10-08 LAB — URINALYSIS, COMPLETE W/RFL CULTURE
Bilirubin Urine: NEGATIVE
Glucose, UA: NEGATIVE
Hgb urine dipstick: NEGATIVE
Hyaline Cast: NONE SEEN /LPF
Ketones, ur: NEGATIVE
Nitrites, Initial: NEGATIVE
Protein, ur: NEGATIVE
RBC / HPF: NONE SEEN /HPF (ref 0–2)
Specific Gravity, Urine: 1.01 (ref 1.001–1.035)
pH: 6.5 (ref 5.0–8.0)

## 2021-10-08 LAB — URINE CULTURE
MICRO NUMBER:: 13253917
Result:: NO GROWTH
SPECIMEN QUALITY:: ADEQUATE

## 2021-10-22 ENCOUNTER — Encounter: Payer: Self-pay | Admitting: Internal Medicine

## 2021-11-02 NOTE — Telephone Encounter (Signed)
Make sure that you are getting enough fluids on the days you do the time restricted eating   Water in morning / ?

## 2021-11-15 ENCOUNTER — Other Ambulatory Visit: Payer: 59

## 2021-11-19 ENCOUNTER — Other Ambulatory Visit: Payer: 59 | Admitting: *Deleted

## 2021-11-19 DIAGNOSIS — Z79899 Other long term (current) drug therapy: Secondary | ICD-10-CM

## 2021-11-19 DIAGNOSIS — R079 Chest pain, unspecified: Secondary | ICD-10-CM

## 2021-11-19 DIAGNOSIS — E785 Hyperlipidemia, unspecified: Secondary | ICD-10-CM

## 2021-11-23 LAB — SPECIMEN STATUS REPORT

## 2021-11-24 ENCOUNTER — Encounter: Payer: Self-pay | Admitting: Internal Medicine

## 2021-11-24 DIAGNOSIS — Z79899 Other long term (current) drug therapy: Secondary | ICD-10-CM

## 2021-11-24 DIAGNOSIS — E785 Hyperlipidemia, unspecified: Secondary | ICD-10-CM

## 2021-11-24 LAB — NMR, LIPOPROFILE

## 2021-11-24 NOTE — Telephone Encounter (Signed)
Spoke with the pt again and she will My Chart me back to let me know when she wants to have the labs redrawn at [facility].... she is going to coordinate with her PCP that also wants her to have labs drawn and let me know.

## 2021-11-24 NOTE — Telephone Encounter (Signed)
I spoke with the pt and apologized that there appeared to be an error on our end with the handling of her labs... she will most probably need to have the labs drawn again.... I spoke with my supervisor and she will also review the problem and be sure the pt can have drawn again without billing her twice... which I told the pt that she probably will not be billed there first time since the labs were not able to be processed.   Labs reordered and after my supervisor reviews I will call the pt back and schedule her for next week per her request.

## 2021-12-01 ENCOUNTER — Other Ambulatory Visit: Payer: Self-pay

## 2021-12-01 DIAGNOSIS — Z79899 Other long term (current) drug therapy: Secondary | ICD-10-CM

## 2021-12-01 DIAGNOSIS — E785 Hyperlipidemia, unspecified: Secondary | ICD-10-CM

## 2021-12-02 LAB — NMR, LIPOPROFILE
Cholesterol, Total: 156 mg/dL (ref 100–199)
HDL Particle Number: 51.1 umol/L (ref >=30.5)
HDL-C: 94 mg/dL (ref >39)
LDL Particle Number: 469 nmol/L (ref <1000)
LDL Size: 20.9 nm (ref >20.5)
LDL-C (NIH Calc): 48 mg/dL (ref 0–99)
LP-IR Score: <25 (ref <=45)
Small LDL Particle Number: 233 nmol/L (ref <=527)
Triglycerides: 72 mg/dL (ref 0–149)

## 2021-12-02 LAB — HEPATIC FUNCTION PANEL
ALT: 23 IU/L (ref 0–32)
AST: 21 IU/L (ref 0–40)
Albumin: 4.3 g/dL (ref 3.8–4.8)
Alkaline Phosphatase: 52 IU/L (ref 44–121)
Bilirubin Total: 0.4 mg/dL (ref 0.0–1.2)
Bilirubin, Direct: 0.13 mg/dL (ref 0.00–0.40)
Total Protein: 6.9 g/dL (ref 6.0–8.5)

## 2021-12-03 ENCOUNTER — Telehealth: Payer: Self-pay | Admitting: *Deleted

## 2021-12-03 DIAGNOSIS — N8110 Cystocele, unspecified: Secondary | ICD-10-CM

## 2021-12-03 NOTE — Telephone Encounter (Signed)
Patient is currently using generic vagifem 10 mcg tablet twice weekly. Reports due to her prolapse she is not sure the medication is fully getting absorbed in her vagina, she feels a little better, but no 100%. She has a Rx for premarin left over and would prefer to go back to premarin vaginal cream. Patient wanted to check with your first before stopping generic vagifem and restarting premarin cream. Please advise

## 2021-12-03 NOTE — Telephone Encounter (Signed)
Pt called back with additional questions.  About referral to PT-she has decided that she would like to have that. She was wondering if since she is using estrogen vaginally if she also had to be taking progesterone?   Please advice.

## 2021-12-10 NOTE — Telephone Encounter (Signed)
Dr.Lavoie  patient informed with "The Vagifem is a low dose of Estradiol and doesn't need Progesterone.  Please refer to PT for pelvic floor reinforcement. "   Referral placed. Patient said she is stopping the generic vagifem and started back on premarin vaginal cream. She is having external vulvar dryness and asked if using a small amount of premarin will be okay?  Please advise

## 2021-12-14 NOTE — Therapy (Unsigned)
OUTPATIENT PHYSICAL THERAPY FEMALE PELVIC EVALUATION   Patient Name: Kathryn Griffin MRN: [id number] DOB:03-03-1956, 66 y.o., female Today's Date: 12/15/2021   PT End of Session - 12/15/21 1353     Visit Number 1    Date for PT Re-Evaluation 03/09/22    Authorization Type [employer]    Authorization - Visit Number 1    Authorization - Number of Visits 60    PT Start Time 1100    PT Stop Time 1324    PT Time Calculation (min) 45 min    Activity Tolerance Patient tolerated treatment well    Behavior During Therapy [facility] for tasks assessed/performed             Past Medical History:  Diagnosis Date   Aspergillus fumigatus ([place]) 04/2013   Asthma    Hx of gestational diabetes mellitus, not currently pregnant    Osteopenia 07/04/2017   T score -2.1 FRAX 9.6% / 1.3%   Prediabetes    Reflux    Vaginal atrophy    Vitamin D deficiency    Vitiligo    Past Surgical History:  Procedure Laterality Date   BREAST CYST EXCISION  1990   COLPOSCOPY  2009   benign   TUBAL LIGATION  1995   Patient Active Problem List   Diagnosis Date Noted   Injection education, encounter for 09/23/2020   Bronchiectasis without complication ([place]) 40/03/2724   SOB (shortness of breath) 08/11/2017   Asthma 07/07/2017   H/O vitamin D deficiency 10/22/2015   Allergic rhinitis 09/04/2014   Chest pain 02/25/2014   Acute bronchitis 07/30/2013   Infection due to aspergillus fumigatus ([place]) 07/18/2013   Lung collapse 05/31/2013   Stridor 05/22/2013   Family history of diabetes mellitus 02/12/2013   Osteopenia 02/12/2013   Menopause 02/12/2013   Cough 10/15/2012    PCP: Glenis Smoker, MD  REFERRING PROVIDER: Princess Bruins, MD  REFERRING DIAG: N81.10 (ICD-10-CM) - Baden-Walker grade 4 cystocele  THERAPY DIAG:  Muscle weakness (generalized)  Rationale for Evaluation and Treatment Rehabilitation  ONSET DATE: 06/27/2021  SUBJECTIVE:                                                                                                                                                                                            SUBJECTIVE STATEMENT: Patient started to notice her prolapse in the past month. I lost 10 # by changing my diet. I am going to get a pessary. I feel it when I stand up and sitting. When using the vaginal inserts have to move things around.  Squeeze ball between knees aggravates hips.  Fluid intake: Yes:  water     PAIN:  Are you having pain? No  PRECAUTIONS: None  WEIGHT BEARING RESTRICTIONS No  FALLS:  Has patient fallen in last 6 months? Yes. Number of falls 2 time due to stiff hips and changing directions not due to balance  LIVING ENVIRONMENT: Lives with: lives with their spouse  OCCUPATION: retired  PLOF: Independent  PATIENT GOALS reduce the prolapse  PERTINENT HISTORY:  Osteopenia Sexual abuse: No  BOWEL MOVEMENT Pain with bowel movement: No Type of bowel movement:average Fully empty rectum: Yes:   Leakage: No  URINATION Pain with urination: none Fully empty bladder: Yes: most of the time but sometimes does not feel she does.  Stream: Strong, sometimes she feels like she has to go to the bathroom and not able to Urgency: Yes: sometimes Frequency: 2 times per hour Leakage:  none Pads: No  INTERCOURSE Pain with intercourse:  yes due to dryness  PREGNANCY Vaginal deliveries 2 Tearing Yes: episiotomy and a lot of stitches first child 11#  PROLAPSE Cystocele grade 4    OBJECTIVE:   DIAGNOSTIC FINDINGS:  none   COGNITION:  Overall cognitive status: Within functional limits for tasks assessed      MUSCLE LENGTH: Hamstrings: Right tight decreased by 25% Left tight and decreased by 25%  LUMBAR SPECIAL TESTS:  Straight leg raise test: Negative but rocks her pelvis with lower abdominal contraction                 POSTURE: decreased lumbar lordosis and scoliosis   PELVIC ALIGNMENT:  correct  alignment  LUMBARAROM/PROM Past back issues  A/PROM A/PROM  eval  Extension Decreased by 75%  Right lateral flexion Decreased by 50%  Left lateral flexion Decreased by 50%  Right rotation Decreased by 50%  Left rotation Decreased by 50%   (Blank rows = not tested)  LOWER EXTREMITY ROM: Full ROM of bilateral hips   (Blank rows = not tested)  LOWER EXTREMITY MMT:  MMT Right eval Left eval  Hip flexion 4-/5 4/5  Hip extension 3/5 3/5  Hip abduction 3/5 3/5  Hip adduction 4/5 4/5    PALPATION:   General  able to contract the abdominals correctly                External Perineal Exam no tenderness                             Internal Pelvic Floor decreased movement of the lateral sides of the introitus, tightness along the levator ani and the posterior vaginal canal  Patient confirms identification and approves PT to assess internal pelvic floor and treatment Yes  PELVIC MMT:   MMT eval  Vaginal 2/5 ant. 1/5 lateral and post.   (Blank rows = not tested)        TONE: average  PROLAPSE: Cystocele grade 4  TODAY'S TREATMENT  EVAL Date:  HEP established-see below  Education on perineal massage for the sides of the introitus and posterior vaginal canal Education on what a cystocele and how it occurred.      PATIENT EDUCATION:  Education details: information on cystocele, perineal massage Person educated: Patient Education method: Explanation, Demonstration, Tactile cues, Verbal cues, and Handouts Education comprehension: verbalized understanding, returned demonstration, verbal cues required, tactile cues required, and needs further education   HOME EXERCISE PROGRAM: See above.   ASSESSMENT:  CLINICAL IMPRESSION: Patient is a 66 y.o. female who was seen today for  physical therapy evaluation and treatment for grade 4 cystocele. Patient has felt the cystocele get worse in the past month. She is not having pain Patient will have some trouble with urination  with initiating a stream.  Pelvic floor strength is 2/5 anteriorly and  1/5 lateral and posteriorly. She has tightness on the posterior vaginal canal, levator ani, and sides of the introitus.   2/5 ant. 1/5 lateral and post. She has weakness in bilateral hips. She has difficulty with contracting her abdominals to reduce the rocking of the pelvis with straight leg raise. She has limited ROM of her back due to having back issue. Patient will benefit from skilled therapy to improve pelvic floor strength and ways to manage her prolapse.   OBJECTIVE IMPAIRMENTS decreased activity tolerance, decreased coordination, decreased endurance, and decreased strength.   ACTIVITY LIMITATIONS carrying, lifting, standing, squatting, and toileting  PARTICIPATION LIMITATIONS: cleaning, laundry, and community activity  PERSONAL FACTORS Osteopenia are also affecting patient's functional outcome.   REHAB POTENTIAL: Good  CLINICAL DECISION MAKING: Evolving/moderate complexity  EVALUATION COMPLEXITY: Moderate   GOALS: Goals reviewed with patient? Yes  SHORT TERM GOALS: Target date: 01/12/2022  Patient understands how to perform manual work to the perineal area to increase tissue mobility.  Baseline: Goal status: INITIAL  2.  Patient is able to perform a circular contraction of the pelvic floor.  Baseline:  Goal status: INITIAL   LONG TERM GOALS: Target date: 03/09/2022   Patient is independent with advanced HEP for pelvic floor and hip strength to improve prolapse.  Baseline:  Goal status: INITIAL  2.  Patient understands pressure management to reduce the strain on the prolapse.  Baseline:  Goal status: INITIAL  3.  Pelvic floor strength >/= 3/5 with good hug of therapist finger.  Baseline:  Goal status: INITIAL  4.  Patient understands how to rest in the middle of the day to reduce her prolapse in supine.  Baseline:  Goal status: INITIAL   PLAN: PT FREQUENCY: 1x/week  PT DURATION: 12  weeks  PLANNED INTERVENTIONS: Therapeutic exercises, Therapeutic activity, Neuromuscular re-education, Patient/Family education, Joint mobilization, Biofeedback, and Manual therapy  PLAN FOR NEXT SESSION: work on posterior vaginal canal and to get a circular contraction; laying on back with legs up to reduce the prolapse in the middle of the day, abdominal bracing, hip flexion isometric   Earlie Counts, PT 12/15/21 2:55 PM

## 2021-12-15 ENCOUNTER — Ambulatory Visit: Payer: 59 | Attending: Obstetrics & Gynecology | Admitting: Physical Therapy

## 2021-12-15 ENCOUNTER — Encounter: Payer: Self-pay | Admitting: Physical Therapy

## 2021-12-15 DIAGNOSIS — M6281 Muscle weakness (generalized): Secondary | ICD-10-CM | POA: Diagnosis present

## 2021-12-15 DIAGNOSIS — N8110 Cystocele, unspecified: Secondary | ICD-10-CM | POA: Diagnosis not present

## 2021-12-15 NOTE — Patient Instructions (Signed)
About Cystocele Overview The pelvic organs, including the bladder, are normally supported by pelvic floor muscles and ligaments.   When these muscles and ligaments are stretched, weakened or torn, the wall between the bladder and the vagina sags or herniates causing a prolapse, sometimes called a cystocele. This condition may cause discomfort and problems with emptying the bladder.  It can be present in various stages.  Some people are not aware of the changes. Others may notice changes at the vaginal opening or a feeling of the bladder dropping outside the body. Causes of a Cystocele A cystocele is usually caused by muscle straining or stretching during childbirth.  In addition, cystocele is more common after menopause, because the hormone estrogen helps keep the elastic tissues around the pelvic organs strong.  A cystocele is more likely to occur when levels of estrogen decrease. Other causes include: heavy lifting, chronic coughing, previous pelvic surgery and obesity.  Symptoms A bladder that has dropped from its normal position may cause: unwanted urine leakage (stress incontinence), frequent urination or urge to urinate, incomplete emptying of the bladder (not feeling bladder relief after emptying), pain or discomfort in the vagina, pelvis, groin, lower back or lower abdomen and frequent urinary tract infections.  Mild cases may not cause any symptoms.  Treatment Options Pelvic floor (Kegel) exercises: Strength training the muscles in your genital area  Behavioral changes: Treating and preventing constipation, taking time to empty your bladder properly, learning to lift properly and/or  avoid heavy lifting when possible, stopping smoking, avoiding weight gain and treating a chronic cough or bronchitis. A pessary: A vaginal support device is sometimes used to help pelvic support caused by muscle and ligament changes. Surgery: Aurgical repair may be necessary if symptoms cannot be managed with  exercise, behavioral changes and a pessary. Surgery is usually considered for severe cases.  About Pelvic Support Problems Pelvic Support Problems Explained Ligaments, muscles, and connective tissue normally hold your bladder, uterus, and other organs in their proper places in your pelvis. When these tissues become weak, a problem with pelvic support may result. Weak support can cause one or more of the pelvic organs to drop down into the vagina. An organ may even drop so far that is partially exposed outside the body.  Pelvic support problems are named by the change in the organ. The main types of pelvic support problems are:  Cystocele: When the bladder drops down into your vagina.  Enterocele: When your small intestine drops between your vagina and rectum.  Rectocele: When your rectum bulges into the vaginal wall.  Uterine prolapse: When your uterus drops into your vagina.  Vaginal prolapse: When the top part of the vagina begins to droop. This sometimes happens after a hysterectomy (removal of the uterus).  Causes Pelvic support problems can be caused by many conditions. They may begin after you give birth, especially if you had a large baby. During childbirth, the muscles and skin of the birth canal (vagina) are stretched and sometimes torn. They heal over time but are not always exactly the same. A long pushing stage of labor may also weaken these tissues as well as very rapid births as the tissues do not have time to stretch so they tear.  Also, after menopause, there are changes in the vaginal walls resulting from a decrease in estrogen. Estrogen helps to keep the tissues toned. Low levels of estrogen weaken the vaginal walls and may cause the bladder to shift from its normal position. As women get   older, the loss of muscle tone and the relaxation of muscles may cause the uterus or other organs to drop.  Over time, conditions like chronic coughing, chronic constipation, doing a lot of heavy  lifting, straining to pass stool, and obesity, can also weaken the pelvic support muscles.  Diagnosing Pelvic Support Problems Your health care provider will ask about your symptoms and do a pelvic examination. Your provider may also do a rectal exam during your pelvic exam. Your provider may ask you to: 1. Bear down and push (like you are having a bowel movement) so he or she can see if your bladder or other part of your body protrudes into the vagina. 2. Contract the muscles of your pelvis to check the strength of your pelvic muscles.  3. Do several types of urine, nerve and muscle tests of the pelvis and around the bladder to see what type of treatment is best for you.   Symptoms Symptoms of pelvic support problems depend on the organ involved, but may include:  urine leakage  stain or fecal loss after a bowel movement trouble having bowel movements  ache in the lower abdomen, groin, or lower back  bladder infection  a feeling of heaviness, pulling, or fullness in the pelvis, or a feeling that something is falling out of the vagina  an organ protruding from your vaginal opening  feeling the need to support the organs or perineal area to empty bladder or bowels painful sexual intercourse.  Many women feel pelvic pressure or trouble holding their urine immediately after childbirth. For some, these symptoms go away permanently, in others they return as they get older.  Treatment Options A prolapsed organ cannot repair itself. Contact your health care provider as soon as you notice symptoms of a problem. Treatment depends on what the specific problem is and how far advanced it is.  The symptoms caused by some pelvic support problems may simply be treated with changes in diet, medicine to soften the stool, weight loss, or avoiding strenuous activities. You may also do pelvic floor exercises to help strengthen your pelvic muscles.  Some cases of prolapse may require a special support device made  from plastic or rubber called a pessary that fits into the vagina to support the uterus, vagina, or bladder. A pessary can also help women who leak urine when coughing, straining, or exercising. In mild cases, a tampon or vaginal diaphragm may be used instead of a pessary.  Talk to your doctor or health care provider about these options. In serious cases, surgery may be needed to put the organs back into their proper place. The uterus may be removed because of the pressure it puts on the bladder.  Your doctor will know what surgery will be best for you. How can I prevent pelvic support problems?  You can help prevent pelvic support problems by:  maintaining a healthy lifestyle  continuing to do pelvic floor exercises after you deliver a baby  maintaining a healthy weight  avoiding a lot of heavy lifting and lifting with your legs (not from your waist)  treating constipation and avoid getting   [facility] [address], [address] [place], [place] [address] Phone # [phone] Fax [phone]  

## 2021-12-15 NOTE — Telephone Encounter (Signed)
Dr.Lavoie  replied:  "Yes, can use Premarin cream 1 applicator twice a week.  She can use a portion of it to apply on the vulva."   Patient informed, scheduled to see PT today.

## 2021-12-20 ENCOUNTER — Telehealth: Payer: Self-pay | Admitting: *Deleted

## 2021-12-20 MED ORDER — PREMARIN 0.625 MG/GM VA CREA
TOPICAL_CREAM | VAGINAL | 3 refills | Status: AC
Start: 2021-12-20 — End: ?

## 2021-12-20 NOTE — Telephone Encounter (Signed)
Patient called requesting refill on premarin vaginal cream. Per telephone encounter on 12/03/21 ""Yes, can use Premarin cream 1 applicator twice a week.  She can use a portion of it to apply on the vulva."  Rx sent.

## 2021-12-23 ENCOUNTER — Ambulatory Visit (INDEPENDENT_AMBULATORY_CARE_PROVIDER_SITE_OTHER): Payer: 59 | Admitting: Obstetrics & Gynecology

## 2021-12-23 ENCOUNTER — Encounter: Payer: Self-pay | Admitting: Obstetrics & Gynecology

## 2021-12-23 VITALS — BP 98/64

## 2021-12-23 DIAGNOSIS — N8110 Cystocele, unspecified: Secondary | ICD-10-CM

## 2021-12-23 DIAGNOSIS — Z4689 Encounter for fitting and adjustment of other specified devices: Secondary | ICD-10-CM | POA: Diagnosis not present

## 2021-12-23 NOTE — Progress Notes (Signed)
    Kathryn Griffin 04/30/1956 [id number]        66 y.o.  [id number]   RP:  Cystocele grade 4/4 for pessary fitting  HPI: Symptomatic Cystocele grade 4/4.  Would like to manage with a pessary.   OB History  Gravida Para Term Preterm AB Living  '4 2     2 2  '$ SAB IAB Ectopic Multiple Live Births  2            # Outcome Date GA Lbr Len/2nd Weight Sex Delivery Anes PTL Lv  4 SAB           3 SAB           2 Para           1 Para             Past medical history,surgical history, problem list, medications, allergies, family history and social history were all reviewed and documented in the EPIC chart.   Directed ROS with pertinent positives and negatives documented in the history of present illness/assessment and plan.  Exam:  Vitals:   12/23/21 1629  BP: 98/64   General appearance:  Normal   Gynecologic exam: Vulva normal.  Milex ring #4 with support inserted.  Squatting/valsalva without pessary coming out.  Patient comfortable with it.  Successfully removed it and put in place the new one that she will keep.  Position of pessary verified by myself, pessary well positioned, good fit.   Assessment/Plan:  66 y.o. [id number]   1. Encounter for fitting and adjustment of pessary Symptomatic Cystocele grade 4/4.  Would like to manage with a pessary.  Milex ring #4 with support fitted well.  Patient was able to remove and insert the pessary without difficulty.   F/U in 4 weeks to confirm good fit.  Precautions associated with pessary management reviewed.  2. Baden-Walker grade 4 cystocele Symptomatic Cystocele grade 4/4.  Manage with a pessary.  Other orders - omeprazole (PRILOSEC) 20 MG capsule; Take 20 mg by mouth 2 (two) times daily. (Patient not taking: Reported on 12/23/2021) - rosuvastatin (CRESTOR) 10 MG tablet; Take 10 mg by mouth at bedtime. - sertraline (ZOLOFT) 25 MG tablet; Take 25 mg by mouth daily. (Patient not taking: Reported on 12/23/2021) - Turmeric (QC TUMERIC  COMPLEX PO); Take by mouth. - Coenzyme Q10 (COQ-10 PO); Take by mouth. - Omega-3 Fatty Acids (FISH OIL PO); Take by mouth. - MAGNESIUM GLYCINATE PO; Take by mouth. - B Complex Vitamins (B COMPLEX PO); Take by mouth. - Probiotic Product (PROBIOTIC PO); Take by mouth.   Counseling on cystocele grade 4/4 with management using a Milex ring number 4 pessary, usage, removal, cleaning and insertion reviewed for 25 minutes.  Princess Bruins MD, 4:49 PM 12/23/2021

## 2021-12-24 ENCOUNTER — Encounter: Payer: Self-pay | Admitting: Obstetrics & Gynecology

## 2022-01-05 ENCOUNTER — Encounter: Payer: Self-pay | Admitting: Physical Therapy

## 2022-01-05 ENCOUNTER — Ambulatory Visit: Payer: 59 | Attending: Obstetrics & Gynecology | Admitting: Physical Therapy

## 2022-01-05 DIAGNOSIS — M6281 Muscle weakness (generalized): Secondary | ICD-10-CM | POA: Insufficient documentation

## 2022-01-05 NOTE — Therapy (Signed)
OUTPATIENT PHYSICAL THERAPY TREATMENT NOTE   Patient Name: Kathryn Griffin MRN: [id number] DOB:[birth year], 66 y.o., female Today's Date: 01/05/2022  PCP: Glenis Smoker, MD REFERRING PROVIDER: Princess Bruins, MD  END OF SESSION:   PT End of Session - 01/05/22 1233     Visit Number 2    Date for PT Re-Evaluation 03/09/22    Authorization Type [employer]    Authorization - Visit Number 2    Authorization - Number of Visits 6    PT Start Time 3295    PT Stop Time 1310    PT Time Calculation (min) 40 min    Activity Tolerance Patient tolerated treatment well    Behavior During Therapy [facility] for tasks assessed/performed             Past Medical History:  Diagnosis Date   Aspergillus fumigatus ([place]) 04/2013   Asthma    H. pylori infection    Hx of gestational diabetes mellitus, not currently pregnant    Osteopenia 07/04/2017   T score -2.1 FRAX 9.6% / 1.3%   Prediabetes    Reflux    Vaginal atrophy    Vitamin D deficiency    Vitiligo    Past Surgical History:  Procedure Laterality Date   BREAST CYST EXCISION  1990   COLPOSCOPY  2009   benign   TUBAL LIGATION  1995   Patient Active Problem List   Diagnosis Date Noted   Injection education, encounter for 09/23/2020   Bronchiectasis without complication ([place]) 18/84/1660   SOB (shortness of breath) 08/11/2017   Asthma 07/07/2017   H/O vitamin D deficiency 10/22/2015   Allergic rhinitis 09/04/2014   Chest pain 02/25/2014   Acute bronchitis 07/30/2013   Infection due to aspergillus fumigatus ([place]) 07/18/2013   Lung collapse 05/31/2013   Stridor 05/22/2013   Family history of diabetes mellitus 02/12/2013   Osteopenia 02/12/2013   Menopause 02/12/2013   Cough 10/15/2012   REFERRING DIAG: N81.10 (ICD-10-CM) - Baden-Walker grade 4 cystocele   THERAPY DIAG:  Muscle weakness (generalized)   Rationale for Evaluation and Treatment Rehabilitation   ONSET DATE: 06/27/2021   SUBJECTIVE:                                                                                                                                                                                             SUBJECTIVE STATEMENT: I am doing fine. I have gotten the pessary. I had to take it out and need to see Dr. Wannetta Sender.  Squeeze ball between knees aggravates hips.  Fluid intake: Yes: water       PAIN:  Are you  having pain? No   PRECAUTIONS: None   WEIGHT BEARING RESTRICTIONS No   FALLS:  Has patient fallen in last 6 months? Yes. Number of falls 2 time due to stiff hips and changing directions not due to balance   LIVING ENVIRONMENT: Lives with: lives with their spouse   OCCUPATION: retired   PLOF: Independent   PATIENT GOALS reduce the prolapse   PERTINENT HISTORY:  Osteopenia Sexual abuse: No   BOWEL MOVEMENT Pain with bowel movement: No Type of bowel movement:average Fully empty rectum: Yes:   Leakage: No   URINATION Pain with urination: none Fully empty bladder: Yes: most of the time but sometimes does not feel she does.  Stream: Strong, sometimes she feels like she has to go to the bathroom and not able to Urgency: Yes: sometimes Frequency: 2 times per hour Leakage:  none Pads: No   INTERCOURSE Pain with intercourse:  yes due to dryness   PREGNANCY Vaginal deliveries 2 Tearing Yes: episiotomy and a lot of stitches first child 11#   PROLAPSE Cystocele grade 4       OBJECTIVE:    DIAGNOSTIC FINDINGS:  none     COGNITION:            Overall cognitive status: Within functional limits for tasks assessed                            MUSCLE LENGTH: Hamstrings: Right tight decreased by 25% Left tight and decreased by 25%   LUMBAR SPECIAL TESTS:  Straight leg raise test: Negative but rocks her pelvis with lower abdominal contraction                    POSTURE: decreased lumbar lordosis and scoliosis               PELVIC ALIGNMENT:  correct alignment   LUMBARAROM/PROM Past back  issues   A/PROM A/PROM  eval  Extension Decreased by 75%  Right lateral flexion Decreased by 50%  Left lateral flexion Decreased by 50%  Right rotation Decreased by 50%  Left rotation Decreased by 50%   (Blank rows = not tested)   LOWER EXTREMITY ROM: Full ROM of bilateral hips    (Blank rows = not tested)   LOWER EXTREMITY MMT:   MMT Right eval Left eval  Hip flexion 4-/5 4/5  Hip extension 3/5 3/5  Hip abduction 3/5 3/5  Hip adduction 4/5 4/5     PALPATION:   General  able to contract the abdominals correctly                 External Perineal Exam no tenderness                             Internal Pelvic Floor decreased movement of the lateral sides of the introitus, tightness along the levator ani and the posterior vaginal canal   Patient confirms identification and approves PT to assess internal pelvic floor and treatment Yes   PELVIC MMT:   MMT eval 01/05/2022  Vaginal 2/5 ant. 1/5 lateral and post.  3/5 with small lift  (Blank rows = not tested)         TONE: average   PROLAPSE: Cystocele grade 4   TODAY'S TREATMENT  01/05/2022 Manual:  Internal pelvic floor techniques:No emotional/communication barriers or cognitive limitation. Patient is motivated to learn. Patient understands and agrees with  treatment goals and plan. PT explains patient will be examined in standing, sitting, and lying down to see how their muscles and joints work. When they are ready, they will be asked to remove their underwear so PT can examine their perineum. The patient is also given the option of providing their own chaperone as one is not provided in our facility. The patient also has the right and is explained the right to defer or refuse any part of the evaluation or treatment including the internal exam. With the patient's consent, PT will use one gloved finger to gently assess the muscles of the pelvic floor, seeing how well it contracts and relaxes and if there is muscle symmetry.  After, the patient will get dressed and PT and patient will discuss exam findings and plan of care. PT and patient discuss plan of care, schedule, attendance policy and HEP activities. Going through the vagina working on the introitus, alont the posterior wall, along the left side of the bladder Neuromuscular re-education: Pelvic floor contraction training: supine diaphragmatic breathing with legs up Supine transverse abdominus 10x Supine ball squeeze with pelvic floor contraction for 5 sec 10x Supine clamshell with red band and pelvic floor contraction 10x     EVAL Date:  HEP established-see below  Education on perineal massage for the sides of the introitus and posterior vaginal canal Education on what a cystocele and how it occurred.       PATIENT EDUCATION: 01/05/2022 Education details: Access Code: [email or web address] Person educated: Patient Education method: Explanation, Demonstration, Tactile cues, Verbal cues, and Handouts Education comprehension: verbalized understanding, returned demonstration, verbal cues required, tactile cues required, and needs further education      HOME EXERCISE PROGRAM: 01/05/2022 Access Code: [email or web address] URL: [email or web address] Date: 01/05/2022 Prepared by: Earlie Counts  Exercises - Diaphragmatic Breathing at 90/90 Supported  - 1 x daily - 7 x weekly - 1 sets - 10 reps - 10 min hold - Supine Transversus Abdominis Bracing - Hands on Stomach  - 1 x daily - 7 x weekly - 1 sets - 10 reps - Supine Hip Adduction Isometric with Ball  - 1 x daily - 7 x weekly - 1 sets - 10 reps - 5sec hold - Supine Transversus Abdominis Bracing with Double Leg Fallout  - 1 x daily - 7 x weekly - 1 sets - 10 reps   ASSESSMENT:   CLINICAL IMPRESSION: Patient is a 66 y.o. female who was seen today for physical therapy  treatment for grade 4 cystocele. Patient pelvic strength increased to 3/5 with a weak lift and full circular contraction.  Patient is understanding on  how to contract the lower abdominals with pelvic floor contraction. She tried one pessary but did not fit well so she is going for another.  Patient will benefit from skilled therapy to improve pelvic floor strength and ways to manage her prolapse.    OBJECTIVE IMPAIRMENTS decreased activity tolerance, decreased coordination, decreased endurance, and decreased strength.    ACTIVITY LIMITATIONS carrying, lifting, standing, squatting, and toileting   PARTICIPATION LIMITATIONS: cleaning, laundry, and community activity   PERSONAL FACTORS Osteopenia are also affecting patient's functional outcome.    REHAB POTENTIAL: Good   CLINICAL DECISION MAKING: Evolving/moderate complexity   EVALUATION COMPLEXITY: Moderate     GOALS: Goals reviewed with patient? Yes   SHORT TERM GOALS: Target date: 01/12/2022   Patient understands how to perform manual work to the perineal area to increase tissue mobility.  Baseline: Goal status: met 01/05/2202  2.  Patient is able to perform a circular contraction of the pelvic floor.  Baseline:  Goal status: met 01/05/2022     LONG TERM GOALS: Target date: 03/09/2022    Patient is independent with advanced HEP for pelvic floor and hip strength to improve prolapse.  Baseline:  Goal status: INITIAL   2.  Patient understands pressure management to reduce the strain on the prolapse.  Baseline:  Goal status: INITIAL   3.  Pelvic floor strength >/= 3/5 with good hug of therapist finger.  Baseline:  Goal status: INITIAL   4.  Patient understands how to rest in the middle of the day to reduce her prolapse in supine.  Baseline:  Goal status: INITIAL     PLAN: PT FREQUENCY: 1x/week   PT DURATION: 12 weeks   PLANNED INTERVENTIONS: Therapeutic exercises, Therapeutic activity, Neuromuscular re-education, Patient/Family education, Joint mobilization, Biofeedback, and Manual therapy   PLAN FOR NEXT SESSION: abdominal bracing, hip flexion isometric bridge, arm  movements, quadruped, pressure management    Earlie Counts, PT 01/05/22 1:15 PM

## 2022-01-06 ENCOUNTER — Ambulatory Visit: Payer: 59 | Admitting: Obstetrics & Gynecology

## 2022-01-10 ENCOUNTER — Encounter: Payer: Self-pay | Admitting: Obstetrics & Gynecology

## 2022-01-10 ENCOUNTER — Ambulatory Visit (INDEPENDENT_AMBULATORY_CARE_PROVIDER_SITE_OTHER): Payer: 59 | Admitting: Obstetrics & Gynecology

## 2022-01-10 VITALS — BP 96/68 | HR 69

## 2022-01-10 DIAGNOSIS — Z4689 Encounter for fitting and adjustment of other specified devices: Secondary | ICD-10-CM | POA: Diagnosis not present

## 2022-01-10 DIAGNOSIS — Z780 Asymptomatic menopausal state: Secondary | ICD-10-CM | POA: Diagnosis not present

## 2022-01-10 DIAGNOSIS — T839XXA Unspecified complication of genitourinary prosthetic device, implant and graft, initial encounter: Secondary | ICD-10-CM

## 2022-01-10 NOTE — Progress Notes (Signed)
    Kathryn Griffin 1955-09-10 [id number]        66 y.o.  G4P2A2L2   RP: Pain with pessary when laying down  HPI: Pessary Milex ring #4 with support x 12/23/21.  Doing well during the day, pessary staying in place in the vagina.  No vaginal bleeding, no discharge.  When lays down at night, feels discomfort in the vagina and notices that the pessary is positioned longitudinally.  Would like to try a different size.  Postmenopause x >10 years.  Well on no systemic HRT.  Receiving ads about the benefits of Estrogen Replacement Therapy and would like to discuss.  Well on Vagifem twice a week vaginally for dryness.   OB History  Gravida Para Term Preterm AB Living  '4 2     2 2  '$ SAB IAB Ectopic Multiple Live Births  2            # Outcome Date GA Lbr Len/2nd Weight Sex Delivery Anes PTL Lv  4 SAB           3 SAB           2 Para           1 Para             Past medical history,surgical history, problem list, medications, allergies, family history and social history were all reviewed and documented in the EPIC chart.   Directed ROS with pertinent positives and negatives documented in the history of present illness/assessment and plan.  Exam:  Vitals:   01/10/22 1104  BP: 96/68  Pulse: 69  SpO2: 98%   General appearance:  Normal  Gynecologic exam:  Vulva normal.  Vaginal exam normal mucosa with no erosion or ulcer.  Attempted Milex #5 ring with support, which was too large.  Milex #3 was a better fit.  Patient did valsalva, squatted and the pessary stayed in good vaginal position.  New Milex #3 ring with support inserted vaginally   Assessment/Plan:  66 y.o. [id number]   1. Problem with vaginal pessary, initial encounter ([place]) Pessary Milex ring #4 with support x 12/23/21.  Doing well during the day, pessary staying in place in the vagina.  No vaginal bleeding, no discharge.  When lays down at night, feels discomfort in the vagina and notices that the pessary is positioned  longitudinally.  Would like to try a different size.  Milex #3 ring with support fitted very well.  Patient left with her own pessary in place.  2. Encounter for fitting and adjustment of pessary Milex #3 ring with support fitted very well.  Patient left with her own pessary in place.  3. Postmenopause Postmenopause x >10 years.  Well on no systemic HRT.  Receiving ads about the benefits of Estrogen Replacement Therapy and would like to discuss.  Well on Vagifem twice a week vaginally for dryness.  Systemic HRT usage, risks and benefits thoroughly reviewed.  Decision not to start at this time.  Other orders - bimatoprost (LATISSE) 0.03 % ophthalmic solution - fluticasone (FLONASE) 50 MCG/ACT nasal spray; Place into the nose. - Bismuth/Metronidaz/Tetracyclin 140-125-125 MG CAPS; Take 3 capsules by mouth 4 (four) times daily.   Counseling and management of pessary with better fit pessary inserted today and counseling and management of symptomatic menopause with discussion of hormone replacement therapy for 25 minutes.  Princess Bruins MD, 11:30 AM 01/10/2022

## 2022-01-11 ENCOUNTER — Other Ambulatory Visit: Payer: Self-pay | Admitting: Family Medicine

## 2022-01-11 DIAGNOSIS — Z1231 Encounter for screening mammogram for malignant neoplasm of breast: Secondary | ICD-10-CM

## 2022-01-12 ENCOUNTER — Encounter: Payer: Self-pay | Admitting: Physical Therapy

## 2022-01-12 ENCOUNTER — Ambulatory Visit: Payer: 59 | Admitting: Physical Therapy

## 2022-01-12 DIAGNOSIS — M6281 Muscle weakness (generalized): Secondary | ICD-10-CM | POA: Diagnosis not present

## 2022-01-12 NOTE — Therapy (Signed)
OUTPATIENT PHYSICAL THERAPY TREATMENT NOTE   Patient Name: Kathryn Griffin MRN: [id number] DOB:1955-11-15, 66 y.o., female Today's Date: 01/12/2022  PCP: Glenis Smoker, MD REFERRING PROVIDER: Princess Bruins, MD  END OF SESSION:   PT End of Session - 01/12/22 1237     Visit Number 3    Date for PT Re-Evaluation 03/09/22    Authorization Type [employer]    Authorization - Visit Number 3    Authorization - Number of Visits 48    PT Start Time 3295    PT Stop Time 1310    PT Time Calculation (min) 40 min    Activity Tolerance Patient tolerated treatment well    Behavior During Therapy [facility] for tasks assessed/performed             Past Medical History:  Diagnosis Date   Aspergillus fumigatus ([place]) 04/2013   Asthma    H. pylori infection    Hx of gestational diabetes mellitus, not currently pregnant    Osteopenia 07/04/2017   T score -2.1 FRAX 9.6% / 1.3%   Prediabetes    Reflux    Vaginal atrophy    Vitamin D deficiency    Vitiligo    Past Surgical History:  Procedure Laterality Date   BREAST CYST EXCISION  1990   COLPOSCOPY  2009   benign   TUBAL LIGATION  1995   Patient Active Problem List   Diagnosis Date Noted   Injection education, encounter for 09/23/2020   Bronchiectasis without complication ([place]) 18/84/1660   SOB (shortness of breath) 08/11/2017   Asthma 07/07/2017   H/O vitamin D deficiency 10/22/2015   Allergic rhinitis 09/04/2014   Chest pain 02/25/2014   Acute bronchitis 07/30/2013   Infection due to aspergillus fumigatus ([place]) 07/18/2013   Lung collapse 05/31/2013   Stridor 05/22/2013   Family history of diabetes mellitus 02/12/2013   Osteopenia 02/12/2013   Menopause 02/12/2013   Cough 10/15/2012   REFERRING DIAG: N81.10 (ICD-10-CM) - Baden-Walker grade 4 cystocele   THERAPY DIAG:  Muscle weakness (generalized)   Rationale for Evaluation and Treatment Rehabilitation   ONSET DATE: 06/27/2021   SUBJECTIVE:                                                                                                                                                                                             SUBJECTIVE STATEMENT: I am doing fine. I have gotten the pessary. I had to take it out and need to see Dr. Wannetta Sender.  Squeeze ball between knees aggravates hips.  Fluid intake: Yes: water       PAIN:  Are you  having pain? No   PRECAUTIONS: None   WEIGHT BEARING RESTRICTIONS No   FALLS:  Has patient fallen in last 6 months? Yes. Number of falls 2 time due to stiff hips and changing directions not due to balance   LIVING ENVIRONMENT: Lives with: lives with their spouse   OCCUPATION: retired   PLOF: Independent   PATIENT GOALS reduce the prolapse   PERTINENT HISTORY:  Osteopenia Sexual abuse: No   BOWEL MOVEMENT Pain with bowel movement: No Type of bowel movement:average Fully empty rectum: Yes:   Leakage: No   URINATION Pain with urination: none Fully empty bladder: Yes: most of the time but sometimes does not feel she does.  Stream: Strong, sometimes she feels like she has to go to the bathroom and not able to Urgency: Yes: sometimes Frequency: 2 times per hour Leakage:  none Pads: No   INTERCOURSE Pain with intercourse:  yes due to dryness   PREGNANCY Vaginal deliveries 2 Tearing Yes: episiotomy and a lot of stitches first child 11#   PROLAPSE Cystocele grade 4       OBJECTIVE:    DIAGNOSTIC FINDINGS:  none     COGNITION:            Overall cognitive status: Within functional limits for tasks assessed                            MUSCLE LENGTH: Hamstrings: Right tight decreased by 25% Left tight and decreased by 25%   LUMBAR SPECIAL TESTS:  Straight leg raise test: Negative but rocks her pelvis with lower abdominal contraction                    POSTURE: decreased lumbar lordosis and scoliosis               PELVIC ALIGNMENT:  correct alignment   LUMBARAROM/PROM Past back  issues   A/PROM A/PROM  eval A/AROM 01/12/2022  Extension Decreased by 75% Decreased  By 50%  Right lateral flexion Decreased by 50% Decreased  By 25%  Left lateral flexion Decreased by 50% Decreased  By 25%  Right rotation Decreased by 50% Decreased  By 25%  Left rotation Decreased by 50% Decreased  By 25%   (Blank rows = not tested)   LOWER EXTREMITY ROM: Full ROM of bilateral hips    (Blank rows = not tested)   LOWER EXTREMITY MMT:   MMT Right eval Left eval  Hip flexion 4-/5 4/5  Hip extension 3/5 3/5  Hip abduction 3/5 3/5  Hip adduction 4/5 4/5     PALPATION:   General  able to contract the abdominals correctly                 External Perineal Exam no tenderness                             Internal Pelvic Floor decreased movement of the lateral sides of the introitus, tightness along the levator ani and the posterior vaginal canal   Patient confirms identification and approves PT to assess internal pelvic floor and treatment Yes   PELVIC MMT:   MMT eval 01/05/2022  Vaginal 2/5 ant. 1/5 lateral and post.  3/5 with small lift  (Blank rows = not tested)         TONE: average   PROLAPSE: Cystocele grade 4   TODAY'S TREATMENT  01/12/2022  Neuromuscular re-education: Core retraining: Core facilitation:transverse abdominus contraction with breath 10x Bridge with ball squeeze 15x in limited range to protect back Hip flexion isometric holding 5 sec each leg but increased hip and back pain.  Therapeutic activities: Functional strengthening activities: supine to sitting with rolling and not holding her breath Sit to stand with flexing at the hips and keeping the rib cage over the pelvis Education on lifting items keeping the distance between the rib cage and pubic bone to reduce the pressure on the prolapse, keeping spinal neutral, using her legs Reviewed in the middle of the day to lay on her back to reset the prolapse     01/05/2022 Manual:   Internal  pelvic floor techniques:No emotional/communication barriers or cognitive limitation. Patient is motivated to learn. Patient understands and agrees with treatment goals and plan. PT explains patient will be examined in standing, sitting, and lying down to see how their muscles and joints work. When they are ready, they will be asked to remove their underwear so PT can examine their perineum. The patient is also given the option of providing their own chaperone as one is not provided in our facility. The patient also has the right and is explained the right to defer or refuse any part of the evaluation or treatment including the internal exam. With the patient's consent, PT will use one gloved finger to gently assess the muscles of the pelvic floor, seeing how well it contracts and relaxes and if there is muscle symmetry. After, the patient will get dressed and PT and patient will discuss exam findings and plan of care. PT and patient discuss plan of care, schedule, attendance policy and HEP activities. Going through the vagina working on the introitus, alont the posterior wall, along the left side of the bladder Neuromuscular re-education: Pelvic floor contraction training: supine diaphragmatic breathing with legs up Supine transverse abdominus 10x Supine ball squeeze with pelvic floor contraction for 5 sec 10x Supine clamshell with red band and pelvic floor contraction 10x          PATIENT EDUCATION: 01/12/2022 Education details: Access Code: [email or web address], pressure management with lifting and activities at home to reduce pressure on the prolapse Person educated: Patient Education method: Explanation, Demonstration, Tactile cues, Verbal cues, and Handouts Education comprehension: verbalized understanding, returned demonstration, verbal cues required, tactile cues required, and needs further education       HOME EXERCISE PROGRAM: 01/12/2022 Access Code: [email or web address] URL:  [email or web address] Date: 01/12/2022 Prepared by: Earlie Counts  Exercises - Diaphragmatic Breathing at 90/90 Supported  - 1 x daily - 7 x weekly - 1 sets - 10 reps - 10 min hold - Supine Transversus Abdominis Bracing - Hands on Stomach  - 1 x daily - 7 x weekly - 1 sets - 10 reps - Supine Hip Adduction Isometric with Ball  - 1 x daily - 7 x weekly - 1 sets - 10 reps - 5sec hold - Supine Transversus Abdominis Bracing with Double Leg Fallout  - 1 x daily - 7 x weekly - 1 sets - 10 reps - Supine Bridge with Mini Swiss Ball Between Knees  - 1 x daily - 3 x weekly - 1 sets - 10 reps   ASSESSMENT:   CLINICAL IMPRESSION: Patient is a 66 y.o. female who was seen today for physical therapy  treatment for grade 4 cystocele. Patient will benefit from skilled therapy to improve pelvic floor strength and ways to manage her prolapse.  OBJECTIVE IMPAIRMENTS decreased activity tolerance, decreased coordination, decreased endurance, and decreased strength.    ACTIVITY LIMITATIONS carrying, lifting, standing, squatting, and toileting   PARTICIPATION LIMITATIONS: cleaning, laundry, and community activity   PERSONAL FACTORS Osteopenia are also affecting patient's functional outcome.    REHAB POTENTIAL: Good   CLINICAL DECISION MAKING: Evolving/moderate complexity   EVALUATION COMPLEXITY: Moderate     GOALS: Goals reviewed with patient? Yes   SHORT TERM GOALS: Target date: 01/12/2022   Patient understands how to perform manual work to the perineal area to increase tissue mobility.  Baseline: Goal status: met 01/05/2202  2.  Patient is able to perform a circular contraction of the pelvic floor.  Baseline:  Goal status: met 01/05/2022     LONG TERM GOALS: Target date: 03/09/2022    Patient is independent with advanced HEP for pelvic floor and hip strength to improve prolapse.  Baseline:  Goal status: INITIAL   2.  Patient understands pressure management to reduce the strain  on the prolapse.  Baseline:  Goal status: met 01/12/2022   3.  Pelvic floor strength >/= 3/5 with good hug of therapist finger.  Baseline:  Goal status: INITIAL   4.  Patient understands how to rest in the middle of the day to reduce her prolapse in supine.  Baseline:  Goal status: INITIAL     PLAN: PT FREQUENCY: 1x/week   PT DURATION: 12 weeks   PLANNED INTERVENTIONS: Therapeutic exercises, Therapeutic activity, Neuromuscular re-education, Patient/Family education, Joint mobilization, Biofeedback, and Manual therapy   PLAN FOR NEXT SESSION:  arm movements, quadruped movement, sitting abdominal bracing  Earlie Counts, PT 01/12/22 1:13 PM

## 2022-01-13 ENCOUNTER — Telehealth: Payer: Self-pay | Admitting: *Deleted

## 2022-01-13 NOTE — Telephone Encounter (Signed)
Dr.Lavoie replied "Please send Fluconazole 150 mg 1 tab every other day x 3"      Dr.Lavoie patient said she has 4 days left on Pylera '140mg'$ /'125mg'$  dose to take. She asked if okay to take diflucan while taking this? Please advise

## 2022-01-13 NOTE — Telephone Encounter (Signed)
Patient called has pessary inserted on 01/10/22 report having yeast infection now, she taking antibiotic. She asked if pessary should be removed to insert Monistat? Or would your call in oral diflucan tablet for her to take? Please advise

## 2022-01-14 MED ORDER — FLUCONAZOLE 150 MG PO TABS: ORAL_TABLET | ORAL | 0 refills | Status: DC

## 2022-01-14 NOTE — Telephone Encounter (Signed)
Patient informed. Rx sent 

## 2022-01-14 NOTE — Telephone Encounter (Signed)
Dr.Lavoie please see the below question about Rx.

## 2022-01-16 ENCOUNTER — Encounter: Payer: Self-pay | Admitting: Obstetrics & Gynecology

## 2022-01-19 ENCOUNTER — Ambulatory Visit: Payer: 59 | Admitting: Obstetrics & Gynecology

## 2022-01-24 ENCOUNTER — Telehealth: Payer: Self-pay

## 2022-01-24 NOTE — Telephone Encounter (Signed)
Patient called in triage voice mail. Problem with new pessary being malpositioned and not staying in place.   Recommend OV with Dr. Marguerita Merles to reassess. Transferred to appointment desk and advised OV recommended and they called and scheduled appt 01/25/22.

## 2022-01-25 ENCOUNTER — Encounter: Payer: Self-pay | Admitting: Obstetrics & Gynecology

## 2022-01-25 ENCOUNTER — Ambulatory Visit (INDEPENDENT_AMBULATORY_CARE_PROVIDER_SITE_OTHER): Payer: 59 | Admitting: Obstetrics & Gynecology

## 2022-01-25 VITALS — BP 110/64 | HR 74

## 2022-01-25 DIAGNOSIS — N898 Other specified noninflammatory disorders of vagina: Secondary | ICD-10-CM | POA: Diagnosis not present

## 2022-01-25 DIAGNOSIS — T839XXD Unspecified complication of genitourinary prosthetic device, implant and graft, subsequent encounter: Secondary | ICD-10-CM

## 2022-01-25 LAB — WET PREP FOR TRICH, YEAST, CLUE

## 2022-01-25 MED ORDER — TERCONAZOLE 0.8 % VA CREA: 1.0000 | TOPICAL_CREAM | Freq: Every day | VAGINAL | 3 refills | Status: AC

## 2022-01-25 NOTE — Progress Notes (Signed)
    Kathryn Griffin Sep 20, 1955 [id number]        66 y.o.  [id number]   RP: Recurrence of vaginal itching/counseling on pessary position in vagina  HPI:  Recurrence of Yeast vaginitis.  Finished a treatment with Fluconazole 6 days ago, had resolution of Sxs then, but itching started back this morning.  Milex pessary changed from a ring #4 to a ring #3 last visit. No vaginal bleeding.  No vaginal pain.  Pessary staying in place in the vagina and preventing bulging.  Patient reports that when she removes it to clean it, it is not perfectly perpendicular to the vagina.   OB History  Gravida Para Term Preterm AB Living  '4 2 2   2 2  '$ SAB IAB Ectopic Multiple Live Births  2            # Outcome Date GA Lbr Len/2nd Weight Sex Delivery Anes PTL Lv  4 SAB           3 SAB           2 Term           1 Term             Past medical history,surgical history, problem list, medications, allergies, family history and social history were all reviewed and documented in the EPIC chart.   Directed ROS with pertinent positives and negatives documented in the history of present illness/assessment and plan.  Exam:  Vitals:   01/25/22 1154  BP: 110/64  Pulse: 74  SpO2: 99%   General appearance:  Normal   Gynecologic exam: Vulva normal.  Milex ring #3 pessary with support in good position at mid vagina about oblique with line of the vagina.  Pessary easily removed and cleaned.  Vaginal mucosa intact.  Vaginal discharge wnl.  Wet prep done.  Pessary put back in place, good fit.  Wet prep:  Yeasts present with hyphae and budding   Assessment/Plan:  66 y.o. P5T6144   1. Vaginal itching Recurrence of Yeast vaginitis.  Finished a treatment with Fluconazole 6 days ago, had resolution of Sxs then, but itching started back this morning.  Will treat with Terconazole cream HS x 3.  Usage reviewed and prescription sent to pharmacy.  Recommend Boric Acid after treatment to reduce the risk of  recurrence. - WET PREP FOR [name], YEAST, CLUE  2. Problem with vaginal pessary, subsequent encounter Milex pessary changed from a ring #4 to a ring #3 last visit.  No vaginal bleeding.  No vaginal pain.  Pessary staying in place in the vagina and preventing bulging.  Patient reports that when she removes it to clean it, it is not perfectly perpendicular to the vagina, but well with the Milex ring #3 with support otherwise.  Reassured that it is ok if the pessary is not completely perpendicular to the vagina, as long as it is comfortable, preventing the bulging, not falling out and not causing vaginal erosion.  Patient voiced understanding and is satisfied with this pessary.  Other orders - atorvastatin (LIPITOR) 10 MG tablet; Take 10 mg by mouth daily. (Patient not taking: Reported on 01/25/2022) - ASHWAGANDHA PO; Take by mouth. - terconazole (TERAZOL 3) 0.8 % vaginal cream; Place 1 applicator vaginally at bedtime for 3 days.   Princess Bruins MD, 12:06 PM 01/25/2022

## 2022-01-26 ENCOUNTER — Encounter: Payer: Self-pay | Admitting: Physical Therapy

## 2022-01-26 ENCOUNTER — Telehealth: Payer: Self-pay | Admitting: *Deleted

## 2022-01-26 ENCOUNTER — Ambulatory Visit: Payer: 59 | Attending: Obstetrics & Gynecology | Admitting: Physical Therapy

## 2022-01-26 DIAGNOSIS — M6281 Muscle weakness (generalized): Secondary | ICD-10-CM | POA: Insufficient documentation

## 2022-01-26 NOTE — Telephone Encounter (Signed)
Patient was seen for an office visit on 01/25/22. Patient thought you discuss a Rx for diflucan and Terazol 3 day cream. She picked up the vaginal cream, however no diflucan Rx was there. She wanted to double check if she was suppose to have diflucan Rx as well. Please advise

## 2022-01-26 NOTE — Therapy (Signed)
OUTPATIENT PHYSICAL THERAPY TREATMENT NOTE   Patient Name: Kathryn Griffin MRN: [id number] DOB:1956-01-17, 66 y.o., female Today's Date: 01/26/2022  PCP: Glenis Smoker, MD REFERRING PROVIDER: Princess Bruins, MD  END OF SESSION:   PT End of Session - 01/26/22 1616     Visit Number 4    Date for PT Re-Evaluation 03/09/22    Authorization Type [employer]    Authorization - Visit Number 4    Authorization - Number of Visits 62    PT Start Time 8372    PT Stop Time 1638    PT Time Calculation (min) 23 min    Activity Tolerance Patient tolerated treatment well    Behavior During Therapy [facility] for tasks assessed/performed             Past Medical History:  Diagnosis Date   Aspergillus fumigatus ([place]) 04/2013   Asthma    H. pylori infection    Hx of gestational diabetes mellitus, not currently pregnant    Osteopenia 07/04/2017   T score -2.1 FRAX 9.6% / 1.3%   Prediabetes    Reflux    Vaginal atrophy    Vitamin D deficiency    Vitiligo    Past Surgical History:  Procedure Laterality Date   BREAST CYST EXCISION  1990   COLPOSCOPY  2009   benign   TUBAL LIGATION  1995   Patient Active Problem List   Diagnosis Date Noted   Injection education, encounter for 09/23/2020   Bronchiectasis without complication ([place]) 90/21/1155   SOB (shortness of breath) 08/11/2017   Asthma 07/07/2017   H/O vitamin D deficiency 10/22/2015   Allergic rhinitis 09/04/2014   Chest pain 02/25/2014   Acute bronchitis 07/30/2013   Infection due to aspergillus fumigatus ([place]) 07/18/2013   Lung collapse 05/31/2013   Stridor 05/22/2013   Family history of diabetes mellitus 02/12/2013   Osteopenia 02/12/2013   Menopause 02/12/2013   Cough 10/15/2012  REFERRING DIAG: N81.10 (ICD-10-CM) - Baden-Walker grade 4 cystocele   THERAPY DIAG:  Muscle weakness (generalized)   Rationale for Evaluation and Treatment Rehabilitation   ONSET DATE: 06/27/2021   SUBJECTIVE:                                                                                                                                                                                             SUBJECTIVE STATEMENT: Things are going fine. I am doing 4 out of 5 exercises. Bridge is not helping my neck. The doctor feels the pessary is working. I have an yeast infection now.      PAIN:  Are you having pain? No  PRECAUTIONS: None   WEIGHT BEARING RESTRICTIONS No   FALLS:  Has patient fallen in last 6 months? Yes. Number of falls 2 time due to stiff hips and changing directions not due to balance   LIVING ENVIRONMENT: Lives with: lives with their spouse   OCCUPATION: retired   PLOF: Independent   PATIENT GOALS reduce the prolapse   PERTINENT HISTORY:  Osteopenia Sexual abuse: No   BOWEL MOVEMENT Pain with bowel movement: No Type of bowel movement:average Fully empty rectum: Yes:   Leakage: No   URINATION Pain with urination: none Fully empty bladder: Yes: most of the time but sometimes does not feel she does.  Stream: Strong, sometimes she feels like she has to go to the bathroom and not able to Urgency: Yes: sometimes Frequency: 2 times per hour Leakage:  none Pads: No   INTERCOURSE Pain with intercourse:  yes due to dryness   PREGNANCY Vaginal deliveries 2 Tearing Yes: episiotomy and a lot of stitches first child 11#   PROLAPSE Cystocele grade 4       OBJECTIVE:    DIAGNOSTIC FINDINGS:  none     COGNITION:            Overall cognitive status: Within functional limits for tasks assessed                            MUSCLE LENGTH: Hamstrings: Right tight decreased by 25% Left tight and decreased by 25%   LUMBAR SPECIAL TESTS:  Straight leg raise test: Negative but rocks her pelvis with lower abdominal contraction                    POSTURE: decreased lumbar lordosis and scoliosis               PELVIC ALIGNMENT:  correct alignment   LUMBARAROM/PROM Past back issues   A/PROM  A/PROM  eval A/AROM 01/12/2022  Extension Decreased by 75% Decreased  By 50%  Right lateral flexion Decreased by 50% Decreased  By 25%  Left lateral flexion Decreased by 50% Decreased  By 25%  Right rotation Decreased by 50% Decreased  By 25%  Left rotation Decreased by 50% Decreased  By 25%   (Blank rows = not tested)   LOWER EXTREMITY ROM: Full ROM of bilateral hips    (Blank rows = not tested)   LOWER EXTREMITY MMT:   MMT Right eval Left eval Right  01/26/2022 Left  01/26/2022  Hip flexion 4-/5 4/5 4/5 4/5  Hip extension 3/5 3/5 3+/5 4-/5  Hip abduction 3/5 3/5 3+/5 4-/5  Hip adduction 4/5 4/5 4/5 4/5     PALPATION:   General  able to contract the abdominals correctly                 External Perineal Exam no tenderness                             Internal Pelvic Floor decreased movement of the lateral sides of the introitus, tightness along the levator ani and the posterior vaginal canal   Patient confirms identification and approves PT to assess internal pelvic floor and treatment Yes   PELVIC MMT:   MMT eval 01/05/2022  Vaginal 2/5 ant. 1/5 lateral and post.  3/5 with small lift  (Blank rows = not tested)         TONE: average  PROLAPSE: Cystocele grade 4   TODAY'S TREATMENT  01/26/2022 Exercises: Stretches/mobility: Strengthening:sitting transverse abdominus with pelvic floor contraction 10x Sitting alternate shoulder flexion with ball squeeze with pelvic floor and abdominal contraction.  Sitting alternate hip flexion with pelvic floor contraction and abdominal bracing.  Self-care: Reviewed prolapse management and how to reset the prolapse with laying down in the  middle of the day.     01/12/2022   Neuromuscular re-education: Core retraining: Core facilitation:transverse abdominus contraction with breath 10x Bridge with ball squeeze 15x in limited range to protect back Hip flexion isometric holding 5 sec each leg but increased hip and back pain.   Therapeutic activities: Functional strengthening activities: supine to sitting with rolling and not holding her breath Sit to stand with flexing at the hips and keeping the rib cage over the pelvis Education on lifting items keeping the distance between the rib cage and pubic bone to reduce the pressure on the prolapse, keeping spinal neutral, using her legs Reviewed in the middle of the day to lay on her back to reset the prolapse      01/05/2022 Manual:   Internal pelvic floor techniques:No emotional/communication barriers or cognitive limitation. Patient is motivated to learn. Patient understands and agrees with treatment goals and plan. PT explains patient will be examined in standing, sitting, and lying down to see how their muscles and joints work. When they are ready, they will be asked to remove their underwear so PT can examine their perineum. The patient is also given the option of providing their own chaperone as one is not provided in our facility. The patient also has the right and is explained the right to defer or refuse any part of the evaluation or treatment including the internal exam. With the patient's consent, PT will use one gloved finger to gently assess the muscles of the pelvic floor, seeing how well it contracts and relaxes and if there is muscle symmetry. After, the patient will get dressed and PT and patient will discuss exam findings and plan of care. PT and patient discuss plan of care, schedule, attendance policy and HEP activities. Going through the vagina working on the introitus, alont the posterior wall, along the left side of the bladder Neuromuscular re-education: Pelvic floor contraction training: supine diaphragmatic breathing with legs up Supine transverse abdominus 10x Supine ball squeeze with pelvic floor contraction for 5 sec 10x Supine clamshell with red band and pelvic floor contraction 10x           PATIENT EDUCATION: 01/26/2022 Education details:  Access Code: [email or web address], pressure management with lifting and activities at home to reduce pressure on the prolapse Person educated: Patient Education method: Explanation, Demonstration, [place] Education comprehension: verbalized understanding, returned demonstration      HOME EXERCISE PROGRAM: 01/26/2022  Access Code: [email or web address] URL: [email or web address] Date: 01/26/2022 Prepared by: Earlie Counts  Exercises - Diaphragmatic Breathing at 90/90 Supported  - 1 x daily - 7 x weekly - 1 sets - 10 reps - 10 min hold - Supine Transversus Abdominis Bracing - Hands on Stomach  - 1 x daily - 7 x weekly - 1 sets - 10 reps - Supine Hip Adduction Isometric with Ball  - 1 x daily - 7 x weekly - 1 sets - 10 reps - 5sec hold - Supine Transversus Abdominis Bracing with Double Leg Fallout  - 1 x daily - 7 x weekly - 1 sets - 10 reps - Seated Transversus Abdominis Bracing  - 1 x daily -  7 x weekly - 1 sets - 10 reps - Seated Hip Adduction Isometrics with Ball  - 1 x daily - 7 x weekly - 1 sets - 10 reps - Seated March  - 1 x daily - 7 x weekly - 1 sets - 10 reps   ASSESSMENT:   CLINICAL IMPRESSION: Patient is a 66 y.o. female who was seen today for physical therapy  treatment for grade 4 cystocele. Patient is independent with her HEP. She is not able to do the bridges due to bothering her neck. Patient understands how to manage her prolapse. She is using a pessary to reduce the prolapse and manage it. She is able to perform a circular contraction of the pelvic floor. Patient will benefit from skilled therapy to improve pelvic floor strength and ways to manage her prolapse.    OBJECTIVE IMPAIRMENTS decreased activity tolerance, decreased coordination, decreased endurance, and decreased strength.    ACTIVITY LIMITATIONS carrying, lifting, standing, squatting, and toileting   PARTICIPATION LIMITATIONS: cleaning, laundry, and community activity   PERSONAL FACTORS Osteopenia are also  affecting patient's functional outcome.    REHAB POTENTIAL: Good   CLINICAL DECISION MAKING: Evolving/moderate complexity   EVALUATION COMPLEXITY: Moderate     GOALS: Goals reviewed with patient? Yes   SHORT TERM GOALS: Target date: 01/12/2022   Patient understands how to perform manual work to the perineal area to increase tissue mobility.  Baseline: Goal status: met 01/05/2202  2.  Patient is able to perform a circular contraction of the pelvic floor.  Baseline:  Goal status: met 01/05/2022     LONG TERM GOALS: Target date: 03/09/2022    Patient is independent with advanced HEP for pelvic floor and hip strength to improve prolapse.  Baseline:  Goal status: Met 01/26/2022   2.  Patient understands pressure management to reduce the strain on the prolapse.  Baseline:  Goal status: met 01/12/2022   3.  Pelvic floor strength >/= 3/5 with good hug of therapist finger.  Baseline:  Goal status: not tested due to yeast infection 01/26/2022   4.  Patient understands how to rest in the middle of the day to reduce her prolapse in supine.  Baseline:  Goal status: Met 01/26/2022    PLAN: PT FREQUENCY: 1x/week   PT DURATION: 12 weeks   PLANNED INTERVENTIONS: Therapeutic exercises, Therapeutic activity, Neuromuscular re-education, Patient/Family education, Joint mobilization, Biofeedback, and Manual therapy   PLAN FOR NEXT SESSION:  Discharge to HEP  Earlie Counts, PT 01/26/22 4:47 PM  PHYSICAL THERAPY DISCHARGE SUMMARY  Visits from Start of Care: 4  Current functional level related to goals / functional outcomes: See above.    Remaining deficits: See above.    Education / Equipment: HEP   Patient agrees to discharge. Patient goals were met. Patient is being discharged due to meeting the stated rehab goals. Thank you for the referral. Earlie Counts, PT 01/26/22 4:47 PM

## 2022-01-28 MED ORDER — FLUCONAZOLE 150 MG PO TABS: 150.0000 mg | ORAL_TABLET | Freq: Once | ORAL | 0 refills | Status: AC

## 2022-01-28 NOTE — Telephone Encounter (Signed)
Dr.Lavoie "Please send Fluconazole, sorry about that."   Patient aware. Rx sent.

## 2022-01-31 ENCOUNTER — Ambulatory Visit
Admission: RE | Admit: 2022-01-31 | Discharge: 2022-01-31 | Disposition: A | Discharge status: Home / Self Care | Payer: 59 | Source: Ambulatory Visit | Attending: Family Medicine | Admitting: Family Medicine

## 2022-01-31 DIAGNOSIS — Z1231 Encounter for screening mammogram for malignant neoplasm of breast: Secondary | ICD-10-CM

## 2022-02-02 ENCOUNTER — Encounter: Payer: 59 | Admitting: Physical Therapy

## 2022-02-09 ENCOUNTER — Encounter: Payer: 59 | Admitting: Physical Therapy

## 2022-02-16 ENCOUNTER — Encounter: Payer: 59 | Admitting: Physical Therapy

## 2022-05-31 ENCOUNTER — Ambulatory Visit (HOSPITAL_BASED_OUTPATIENT_CLINIC_OR_DEPARTMENT_OTHER)
Admission: RE | Admit: 2022-05-31 | Discharge: 2022-05-31 | Disposition: A | Discharge status: Home / Self Care | Payer: 59 | Source: Ambulatory Visit | Attending: Family Medicine | Admitting: Family Medicine

## 2022-05-31 DIAGNOSIS — J4551 Severe persistent asthma with (acute) exacerbation: Secondary | ICD-10-CM | POA: Diagnosis present

## 2022-06-10 ENCOUNTER — Ambulatory Visit (INDEPENDENT_AMBULATORY_CARE_PROVIDER_SITE_OTHER): Payer: 59 | Admitting: Emergency Medicine

## 2022-06-10 ENCOUNTER — Encounter: Payer: Self-pay | Admitting: Emergency Medicine

## 2022-06-10 VITALS — BP 120/66 | HR 91 | Temp 98.4°F | Ht 67.0 in | Wt 144.6 lb

## 2022-06-10 DIAGNOSIS — J479 Bronchiectasis, uncomplicated: Secondary | ICD-10-CM

## 2022-06-10 DIAGNOSIS — J309 Allergic rhinitis, unspecified: Secondary | ICD-10-CM | POA: Diagnosis not present

## 2022-06-10 DIAGNOSIS — J4551 Severe persistent asthma with (acute) exacerbation: Secondary | ICD-10-CM

## 2022-06-10 MED ORDER — QVAR REDIHALER 80 MCG/ACT IN AERB: 2.0000 | INHALATION_SPRAY | Freq: Two times a day (BID) | RESPIRATORY_TRACT | 6 refills | Status: DC

## 2022-06-10 NOTE — Patient Instructions (Signed)
We will change your Flovent over to the Qvar RediHaler 80 mcg, 2 puffs twice a day.  Rinse and gargle after using Keep albuterol available to use 2 puffs when needed for shortness of breath, chest tightness, wheezing. Restart your Allegra once daily during the allergy season if you develop symptoms Restart your fluticasone nasal spray 2 sprays each nostril once daily if needed for congestion and allergies We reviewed your CT scan of the chest today.  This showed improved airways inflammation.  We will decide at your next the timing with which we will repeat any chest imaging Follow Dr. Lamonte Sakai in 6 months, sooner if you have any problems.

## 2022-06-10 NOTE — Assessment & Plan Note (Signed)
Not requiring maintenance therapy right now.  We talked about going back on the Allegra and/or fluticasone if she develops symptoms during the allergy season

## 2022-06-10 NOTE — Assessment & Plan Note (Signed)
Doing clinically very well.  In retrospect it would seem that her active asthma was related to exposures, probably exposures in her workplace (she has since retired).  Her IgE in April was in the normal range as was her eosinophil count.  She has been able to come off most of her medications, now only on an ICS.  We will change her Flovent to Qvar for formulary reasons.

## 2022-06-10 NOTE — Assessment & Plan Note (Signed)
Reviewed her CT scan of the chest.  Areas of bronchiectasis are stable to improved.  There is some micronodular disease that could reflect opportunistic infection but given her overall clinical improvement and radiographical improvement I do not believe we need to pursue bronchoscopy at this time.  Nothing to support active fungal infection.  We will follow her clinically, decide when to repeat her CT chest based on her clinical status.

## 2022-06-10 NOTE — Progress Notes (Signed)
   Subjective:    Patient ID: Kathryn Griffin, female    DOB: [birth year], 66 y.o.   MRN: [id number]  HPI  ROV 09/29/21 --follow-up visit for 66 year old woman with severe persistent asthma, eosinophilic phenotype with associated bronchiectasis.  Suspected ABPA (treated).  She has been on Xolair in the past, not currently - stopped in October 22.  At her last visit we transitioned Breo back to Flovent to see if she would tolerate (was started when she was flaring).  She is on Allegra and a nasal steroid.  Most recent CT chest with some subtle patchy bronchial wall thickening inflammatory change right middle lobe and lingula 05/2021. No cough. She is doing very well. Great exercise tolerance.    ROV 06/10/22 --66 year old woman with eosinophilic phenotype and severe persistent asthma.  She is been treated in the past for ABPA.  Imaging significant for associated bronchiectasis.  She was previously on Xolair, not currently.  We have been able to transition her to Flovent only. She is taking Allegra prn, nasal steroid only prn. Never needs her albuterol.   09/29/2021 IgE 88 (significantly decreased from 01/19/2021), eosinophils normal  CT chest 05/31/2022 reviewed by me, shows some apical scarring, no consolidation.  Mild right middle lobe bronchiectatic change, some insufflated mucus.  There is been resolution of much of the previously seen groundglass especially in the right lower lobe.  Improvement in left upper lobe micronodular disease, stable right lower lobe micronodular disease.   Review of Systems As per HPI     Objective:   Physical Exam Vitals:   06/10/22 1553  BP: 120/66  Pulse: 91  Temp: 98.4 F (36.9 C)  TempSrc: Oral  SpO2: 98%  Weight: 144 lb 9.6 oz (65.6 kg)  Height: '5\' 7"'$  (1.702 m)   Gen: Pleasant, well-nourished, in no distress,  normal affect  ENT: No lesions,  mouth clear,  oropharynx clear, no postnasal drip  Neck: No JVD, no stridor  Lungs: No use of accessory  muscles, No crackles, no wheezes.  Cardiovascular: RRR, heart sounds normal, no murmur or gallops, no peripheral edema  Musculoskeletal: No deformities, no cyanosis or clubbing  Neuro: alert, awake, non focal  Skin: Warm, no lesions or rash      Assessment & Plan:  Asthma Doing clinically very well.  In retrospect it would seem that her active asthma was related to exposures, probably exposures in her workplace (she has since retired).  Her IgE in April was in the normal range as was her eosinophil count.  She has been able to come off most of her medications, now only on an ICS.  We will change her Flovent to Qvar for formulary reasons.  Allergic rhinitis Not requiring maintenance therapy right now.  We talked about going back on the Allegra and/or fluticasone if she develops symptoms during the allergy season  Bronchiectasis without complication [facility]) Reviewed her CT scan of the chest.  Areas of bronchiectasis are stable to improved.  There is some micronodular disease that could reflect opportunistic infection but given her overall clinical improvement and radiographical improvement I do not believe we need to pursue bronchoscopy at this time.  Nothing to support active fungal infection.  We will follow her clinically, decide when to repeat her CT chest based on her clinical status.    Baltazar Apo, MD, PhD 06/10/2022, 4:31 PM [facility] [phone] or if no answer [phone]

## 2022-07-29 ENCOUNTER — Telehealth: Payer: Self-pay | Admitting: Internal Medicine

## 2022-07-29 NOTE — Telephone Encounter (Signed)
Patient states she sees another dr now. Please advise

## 2022-07-29 NOTE — Telephone Encounter (Signed)
Left detailed message informing pt that I was forwarding this to Dr. Alan Ripper nurse who would follow up with her next week on this matter.

## 2022-08-02 NOTE — Telephone Encounter (Signed)
Pt says she is seeing a new Cardiologist at [facility] due to ins issues and convenience... she has her records and will let us know if she needs [place].

## 2022-08-24 ENCOUNTER — Telehealth: Payer: Self-pay | Admitting: Emergency Medicine

## 2022-08-24 NOTE — Telephone Encounter (Signed)
Patient states needs refill for Albuterol inhaler. Pharmacy Is [facility]. Patient phone number is [phone].

## 2022-08-25 MED ORDER — ALBUTEROL SULFATE HFA 108 (90 BASE) MCG/ACT IN AERS: INHALATION_SPRAY | RESPIRATORY_TRACT | 5 refills | Status: DC

## 2022-08-25 NOTE — Telephone Encounter (Signed)
Called and spoke with patient. She was requesting a refill on her albuterol to be sent to [facility]. I advised her I would go ahead and send in the RX. She verbalized understanding.   Nothing further needed at time of call.

## 2023-01-08 ENCOUNTER — Other Ambulatory Visit: Payer: Self-pay | Admitting: Emergency Medicine

## 2023-01-10 ENCOUNTER — Other Ambulatory Visit: Payer: Self-pay | Admitting: Obstetrics and Gynecology

## 2023-01-10 DIAGNOSIS — N644 Mastodynia: Secondary | ICD-10-CM

## 2023-02-02 ENCOUNTER — Other Ambulatory Visit: Payer: 59

## 2023-02-09 ENCOUNTER — Ambulatory Visit
Admission: RE | Admit: 2023-02-09 | Discharge: 2023-02-09 | Disposition: A | Discharge status: Home / Self Care | Payer: 59 | Source: Ambulatory Visit | Attending: Obstetrics and Gynecology | Admitting: Obstetrics and Gynecology

## 2023-02-09 DIAGNOSIS — N644 Mastodynia: Secondary | ICD-10-CM

## 2023-02-16 ENCOUNTER — Telehealth: Payer: Self-pay | Admitting: Adult Health

## 2023-02-16 NOTE — Telephone Encounter (Signed)
Pos Covid Test, She was prescribed something before that wasn't paxlovid. Wants to know what it is she took

## 2023-02-16 NOTE — Telephone Encounter (Signed)
Patient is calling to get a prescription for her Covid. She was prescribed something before that wasn't paxlovid.  [facility] on [address] [place]

## 2023-02-17 NOTE — Telephone Encounter (Signed)
Spoke with patient regarding prior message. Patient stated she did contact urgent care and done a visit with them and was proscribed Paxlovid.   Nothing else further needed.  Will close thins encounter

## 2023-04-25 ENCOUNTER — Telehealth: Payer: Self-pay | Admitting: Emergency Medicine

## 2023-04-25 DIAGNOSIS — J479 Bronchiectasis, uncomplicated: Secondary | ICD-10-CM

## 2023-04-25 NOTE — Telephone Encounter (Signed)
Pt states she has been having a flare up ongoing for 3 weeks. Her last OV was 06/10/22, pt was advised to f/u in 6 months per last ov notes. Pt states she has been using her albuterol inhaler. Kathryn Griffin is asking if prednisone could be sent in. I would also like if I can book her a ov on December 10th in one of the lung nodule slots ? Please advise.

## 2023-04-25 NOTE — Telephone Encounter (Signed)
Pt calling in bc she is having a flare up that causes mold in her lungs and is requesting prednisone  Pharmacy [id number] - [place], [place] - [address] AT [facility] OF [address] & [facility] [address]

## 2023-04-27 NOTE — Telephone Encounter (Signed)
Ok to send pred:  Take 40mg  daily for 3 days, then 30mg  daily for 3 days, then 20mg  daily for 3 days, then 10mg  daily for 3 days, then stop  Also, get her in to be seen by RB or APP, but do not use nodule slot please. Thanks.

## 2023-04-27 NOTE — Telephone Encounter (Signed)
LM X1 for patient.

## 2023-04-27 NOTE — Telephone Encounter (Signed)
Scheduled next available appointment with Shana Chute for January 3,2025. Patient is wondering if she will need a CT scan before appointment. Please call and advise.

## 2023-04-28 MED ORDER — PREDNISONE 10 MG PO TABS: ORAL_TABLET | ORAL | 0 refills | Status: AC

## 2023-04-28 NOTE — Telephone Encounter (Signed)
I called and spoke with the pt and notified of response per Dr. Delton Coombes  She verbalized understanding  The CT appt is scheduled for 06/07/23 Nothing further needed

## 2023-04-28 NOTE — Telephone Encounter (Signed)
Given the recent flaring I think would be reasonable to repeat her CT chest to assess the status of her bronchiectasis.  I will order

## 2023-04-28 NOTE — Telephone Encounter (Signed)
Patient is aware of below message/recommendations and voiced her understanding.  Prednisone has been sent to preferred pharmacy.  Pending appt 06/30/23. Patient is questioning if CT is needed prior to appt.  Dr. Delton Coombes, please advise. Thanks

## 2023-06-07 ENCOUNTER — Ambulatory Visit
Admission: RE | Admit: 2023-06-07 | Discharge: 2023-06-07 | Disposition: A | Discharge status: Home / Self Care | Payer: 59 | Source: Ambulatory Visit | Attending: Emergency Medicine | Admitting: Emergency Medicine

## 2023-06-07 DIAGNOSIS — J479 Bronchiectasis, uncomplicated: Secondary | ICD-10-CM

## 2023-06-19 ENCOUNTER — Telehealth: Payer: Self-pay | Admitting: Emergency Medicine

## 2023-06-19 NOTE — Telephone Encounter (Signed)
Pt calling in to get her CT results read to her

## 2023-06-23 ENCOUNTER — Telehealth: Payer: Self-pay | Admitting: Adult Health

## 2023-06-23 NOTE — Telephone Encounter (Signed)
Pt will like to see Tammy Parrett instead of Ames Dura due to Tammy already knowing patient history. First avail isnt until Feb and pt will like to be seen in [place]

## 2023-06-26 NOTE — Telephone Encounter (Signed)
Spoke with the patient to review her CT scan of the chest.  Shows some scattered bronchiectatic change, mucus impaction.  There is some increased inflammatory change peripherally, right greater than left.  Also a focus of micronodular inflammatory change in the right lower lobe that was not present on her prior.  She has been having more symptoms, more cough and dyspnea.  Productive of gray thick mucus, no blood.  She has an appointment to see Buelah Manis this week.  At that appointment I would like for her to give [place] a sputum sample for AFB, fungal, bacterial culture.  We should also check CBC with differential (eosinophils) and also an IgE.  After we get the sputum sample would be reasonable to treat her empirically for possible bronchiectasis flare with antibiotics, possibly another round of prednisone as well.  I just want to get the culture data and the labs done first.  FYI to BW

## 2023-06-27 NOTE — Progress Notes (Deleted)
 @Patient  ID: Kathryn Griffin, female    DOB: 1956-06-10, 67 y.o.   MRN: [id number]  No chief complaint on file.   Referring provider: Chrystal Lamarr RAMAN, *  HPI:   06/30/2023 See telephone note from Dr. Shelah    Allergies  Allergen Reactions   Molds & Smuts Shortness Of Breath   Levofloxacin      Other reaction(s): tendons tear   Dexilant [Dexlansoprazole] Anxiety   Protonix  [Pantoprazole  Sodium] Anxiety    Immunization History  Administered Date(s) Administered   Influenza Split 04/02/2010, 06/29/2012, 04/19/2013, 04/19/2014, 05/10/2015, 04/11/2016, 04/23/2016, 04/22/2018   Influenza Whole 03/27/2012, 04/29/2014   Influenza,inj,Quad PF,6+ Mos 04/12/2017, 04/22/2018, 03/12/2019, 03/24/2020   Influenza-Unspecified 04/27/2021   PFIZER(Purple Top)SARS-COV-2 Vaccination 09/06/2019, 09/27/2019, 05/13/2020   Pneumococcal Conjugate-13 01/21/2015   Pneumococcal Polysaccharide-23 10/15/2012   Tdap 05/11/2017    Past Medical History:  Diagnosis Date   Aspergillus fumigatus (HCC) 04/2013   Asthma    H. pylori infection    Hx of gestational diabetes mellitus, not currently pregnant    Osteopenia 07/04/2017   T score -2.1 FRAX 9.6% / 1.3%   Prediabetes    Reflux    Vaginal atrophy    Vitamin D  deficiency    Vitiligo     Tobacco History: Social History   Tobacco Use  Smoking Status Never  Smokeless Tobacco Never   Counseling given: Not Answered   Outpatient Medications Prior to Visit  Medication Sig Dispense Refill   albuterol  (PROVENTIL ) (2.5 MG/3ML) 0.083% nebulizer solution USE 1 VIAL VIA NEBULIZER EVERY 6 HOURS AS NEEDED FOR WHEEZING OR SHORTNESS OF BREATH 360 mL 11   albuterol  (VENTOLIN  HFA) 108 (90 Base) MCG/ACT inhaler inhale 2 puffs by mouth every 4 hours if needed 18 g 5   ASHWAGANDHA PO Take by mouth.     atorvastatin (LIPITOR) 10 MG tablet Take 10 mg by mouth daily.     B Complex Vitamins (B COMPLEX PO) Take by mouth.     beclomethasone (QVAR   REDIHALER) 80 MCG/ACT inhaler INHALE 2 PUFFS INTO THE LUNGS TWICE DAILY 10.6 g 6   bimatoprost (LATISSE) 0.03 % ophthalmic solution      Calcium  Citrate (CITRACAL PO) Take 1 tablet by mouth daily at 8 pm.      Cholecalciferol (VITAMIN D ) 2000 units CAPS Take 1 capsule by mouth daily.     Coenzyme Q10 (COQ-10 PO) Take by mouth.     conjugated estrogens  (PREMARIN ) vaginal cream Insert applicator 1 vaginal twice weekly at bedtime. 42.5 g 3   EPINEPHrine  0.3 mg/0.3 mL IJ SOAJ injection Inject 0.3 mg into the muscle as needed for anaphylaxis. 1 each 2   famotidine  (PEPCID ) 20 MG tablet Take 20 mg by mouth as needed.      fexofenadine (ALLEGRA) 180 MG tablet Take 180 mg by mouth daily.     fluticasone  (FLONASE ) 50 MCG/ACT nasal spray Place 2 sprays into both nostrils daily.      MAGNESIUM  GLYCINATE PO Take by mouth.     meloxicam (MOBIC) 15 MG tablet Take 1 tablet by mouth daily.     Omega-3 Fatty Acids (FISH OIL PO) Take by mouth.     omeprazole (PRILOSEC) 20 MG capsule Take 20 mg by mouth 2 (two) times daily.     pimecrolimus (ELIDEL) 1 % cream Apply 1 application topically 2 (two) times daily.     predniSONE  (DELTASONE ) 10 MG tablet 4tabx3d,3tabx3d,2tabx3d,1tabx3d 30 tablet 0   Probiotic Product (PROBIOTIC PO) Take by mouth.  Ruxolitinib Phosphate (OPZELURA EX) Apply 1 application topically in the morning and at bedtime.     Spacer/Aero-Holding Chambers (AEROCHAMBER MV) inhaler Use as instructed 1 each 0   Turmeric (QC TUMERIC COMPLEX PO) Take by mouth.     No facility-administered medications prior to visit.      Review of Systems  Review of Systems   Physical Exam  There were no vitals taken for this visit. Physical Exam   Lab Results:  CBC    Component Value Date/Time   WBC 6.7 09/29/2021 0924   RBC 4.47 09/29/2021 0924   HGB 13.6 09/29/2021 0924   HCT 40.9 09/29/2021 0924   PLT 351.0 09/29/2021 0924   MCV 91.5 09/29/2021 0924   MCH 30.6 06/25/2021 1311   MCHC 33.2  09/29/2021 0924   RDW 13.9 09/29/2021 0924   LYMPHSABS 1.0 09/29/2021 0924   MONOABS 0.8 09/29/2021 0924   EOSABS 0.0 09/29/2021 0924   BASOSABS 0.0 09/29/2021 0924    BMET    Component Value Date/Time   NA 139 06/25/2021 1311   K 3.6 06/25/2021 1311   CL 101 06/25/2021 1311   CO2 28 06/25/2021 1311   GLUCOSE 136 (H) 06/25/2021 1311   BUN 18 06/25/2021 1311   CREATININE 0.68 06/25/2021 1311   CREATININE 0.72 08/10/2017 0837   CALCIUM  9.5 06/25/2021 1311   GFRNONAA >60 06/25/2021 1311   GFRAA >60 12/04/2014 1743    BNP No results found for: BNP  ProBNP No results found for: PROBNP  Imaging: CT Chest Wo Contrast Result Date: 06/14/2023 CLINICAL DATA:  Diffuse/interstitial lung disease, bronchiectasis. EXAM: CT CHEST WITHOUT CONTRAST TECHNIQUE: Multidetector CT imaging of the chest was performed following the standard protocol without IV contrast. RADIATION DOSE REDUCTION: This exam was performed according to the departmental dose-optimization program which includes automated exposure control, adjustment of the mA and/or kV according to patient size and/or use of iterative reconstruction technique. COMPARISON:  05/31/2022, 06/25/2021, 06/08/2020, 06/26/2017. FINDINGS: Cardiovascular: Atherosclerotic calcification of the aorta. Heart is at the upper limits of normal in size to mildly enlarged. No pericardial effusion. Mediastinum/Nodes: No pathologically enlarged mediastinal or axillary lymph nodes. Hilar regions are difficult to definitively evaluate without IV contrast. Esophagus is grossly unremarkable. Lungs/Pleura: Biapical pleuroparenchymal scarring. Cylindrical bronchiectasis. Scattered peribronchovascular nodularity, mucoid impaction and volume loss are progressive from 05/31/2022. No pleural fluid. Airway is unremarkable. Upper Abdomen: Visualized portions of the liver, adrenal glands, kidneys, spleen, pancreas, stomach and bowel are grossly unremarkable. No upper abdominal  adenopathy. Musculoskeletal: Degenerative changes in the spine. IMPRESSION: 1. Cylindrical bronchiectasis with progressive scattered peribronchovascular nodularity, mucoid impaction and volume loss, indicative of mycobacterium avium complex. 2.  Aortic atherosclerosis (ICD10-I70.0). Electronically Signed   By: Newell Eke M.D.   On: 06/14/2023 12:42     Assessment & Plan:   No problem-specific Assessment & Plan notes found for this encounter.     Almarie LELON Ferrari, NP 06/27/2023

## 2023-06-30 ENCOUNTER — Ambulatory Visit: Payer: 59 | Admitting: Primary Care

## 2023-06-30 NOTE — Telephone Encounter (Signed)
 Pt has a OV in January with Dr.Byrum, nfn

## 2023-07-04 ENCOUNTER — Telehealth: Payer: Self-pay | Admitting: Emergency Medicine

## 2023-07-04 DIAGNOSIS — B4489 Other forms of aspergillosis: Secondary | ICD-10-CM

## 2023-07-04 DIAGNOSIS — J4551 Severe persistent asthma with (acute) exacerbation: Secondary | ICD-10-CM

## 2023-07-04 NOTE — Telephone Encounter (Signed)
 PT calling about upcoming appt. States she ws supposed to have BW done but I see no active req and last AVS states nothing about Blood work. Please call PT to advise. Her # is [phone].  When Dr. Florence PT w/CT results he said he'd speak to Kathryn Griffin about BW order.

## 2023-07-04 NOTE — Telephone Encounter (Signed)
 I've never seen patient, she was scheduled to see me on January 3rd but I was out sick. I have no insight on lab work. Dr. Inis Sizer is primary provider and will be seeing patient next week

## 2023-07-05 NOTE — Telephone Encounter (Signed)
 NFN

## 2023-07-13 ENCOUNTER — Encounter: Payer: Self-pay | Admitting: Emergency Medicine

## 2023-07-13 ENCOUNTER — Ambulatory Visit: Payer: 59 | Admitting: Emergency Medicine

## 2023-07-13 VITALS — BP 118/61 | HR 58 | Ht 67.0 in | Wt 144.4 lb

## 2023-07-13 DIAGNOSIS — J4551 Severe persistent asthma with (acute) exacerbation: Secondary | ICD-10-CM

## 2023-07-13 DIAGNOSIS — J309 Allergic rhinitis, unspecified: Secondary | ICD-10-CM

## 2023-07-13 DIAGNOSIS — J479 Bronchiectasis, uncomplicated: Secondary | ICD-10-CM | POA: Diagnosis not present

## 2023-07-13 DIAGNOSIS — B4489 Other forms of aspergillosis: Secondary | ICD-10-CM

## 2023-07-13 LAB — CBC WITH DIFFERENTIAL/PLATELET
Basophils Absolute: 0.1 10*3/uL (ref 0.0–0.1)
Basophils Relative: 1 % (ref 0.0–3.0)
Eosinophils Absolute: 0.4 10*3/uL (ref 0.0–0.7)
Eosinophils Relative: 5.4 % — ABNORMAL HIGH (ref 0.0–5.0)
HCT: 42.9 % (ref 36.0–46.0)
Hemoglobin: 14 g/dL (ref 12.0–15.0)
Lymphocytes Relative: 19.9 % (ref 12.0–46.0)
Lymphs Abs: 1.3 10*3/uL (ref 0.7–4.0)
MCHC: 32.7 g/dL (ref 30.0–36.0)
MCV: 92.6 fL (ref 78.0–100.0)
Monocytes Absolute: 0.5 10*3/uL (ref 0.1–1.0)
Monocytes Relative: 7.3 % (ref 3.0–12.0)
Neutro Abs: 4.3 10*3/uL (ref 1.4–7.7)
Neutrophils Relative %: 66.4 % (ref 43.0–77.0)
Platelets: 337 10*3/uL (ref 150.0–400.0)
RBC: 4.64 Mil/uL (ref 3.87–5.11)
RDW: 13.4 % (ref 11.5–15.5)
WBC: 6.6 10*3/uL (ref 4.0–10.5)

## 2023-07-13 NOTE — Progress Notes (Signed)
Subjective:    Patient ID: Kathryn Griffin, female    DOB: January 11, 1956, 68 y.o.   MRN: [id number]  HPI  ROV 06/10/22 --68 year old woman with eosinophilic phenotype and severe persistent asthma.  She is been treated in the past for ABPA.  Imaging significant for associated bronchiectasis.  She was previously on Xolair, not currently.  We have been able to transition her to Flovent only. She is taking Allegra prn, nasal steroid only prn. Never needs her albuterol.   09/29/2021 IgE 88 (significantly decreased from 01/19/2021), eosinophils normal  CT chest 05/31/2022 reviewed by me, shows some apical scarring, no consolidation.  Mild right middle lobe bronchiectatic change, some insufflated mucus.  There is been resolution of much of the previously seen groundglass especially in the right lower lobe.  Improvement in left upper lobe micronodular disease, stable right lower lobe micronodular disease.   ROV 07/13/2023 --68 year old woman with severe persistent asthma and history of ABPA.  She has an eosinophilic phenotype, chronic allergic rhinitis and has previously been on Xolair, not currently.  She has associated bronchiectasis on chest imaging. She is having cough, has improved some over the last week. Has been productive of gray / tan.  She is on flonase, allegra, flutter valve, mucinex. Remains on qvar. Minimal albuterol use unless her cough and mucous burden .   CT chest 06/07/2023 reviewed by me showed no mediastinal or hilar adenopathy, some biapical pleural-parenchymal scarring.  Some scattered peribronchovascular nodularity and mucoid impaction a bit more prominent than her previous CT 05/31/2022    Review of Systems As per HPI     Objective:   Physical Exam Vitals:   07/13/23 1305  BP: 118/61  Pulse: (!) 58  SpO2: 97%  Weight: 144 lb 6.4 oz (65.5 kg)  Height: 5\' 7"  (1.702 m)    Gen: Pleasant, well-nourished, in no distress,  normal affect  ENT: No lesions,  mouth clear,   oropharynx clear, no postnasal drip  Neck: No JVD, no stridor  Lungs: No use of accessory muscles, No crackles, no wheezes.  Cardiovascular: RRR, heart sounds normal, no murmur or gallops, no peripheral edema  Musculoskeletal: No deformities, no cyanosis or clubbing  Neuro: alert, awake, non focal  Skin: Warm, no lesions or rash      Assessment & Plan:  Asthma Asthma/allergies with a history of ABPA.  She is currently off Xolair, was able to come off because she was doing well.  We will check a eosinophil count and IgE today.  Could consider reinitiation Xolair at some point in the future depending on clinical course and her IgE level.  I think based on her bronchiectasis and CT we need to get adequate culture data.  Plan to continue Qvar, continue to treat rhinitis  Allergic rhinitis Continue Flonase, Allegra  Bronchiectasis without complication (HCC) Use Mucinex as needed to help with mucus clearance Continue your flutter valve as you have been using it We will order sputum culture to be sent for bacterial, mycobacterial and fungal organisms.  If we are unable to get a good mucous sample then we will consider possible bronchoscopy for cultures.  We can talk more about this next visit We will plan to repeat your CT scan of the chest in December 2025.  We may decide to do so sooner depending on the clinical course. Follow with Dr. Delton Coombes next available appointment so we can review your cultures.     Levy Pupa, MD, PhD 07/13/2023, 1:24 PM [facility] [phone] or if no answer [phone]

## 2023-07-13 NOTE — Assessment & Plan Note (Signed)
Continue Flonase, Allegra

## 2023-07-13 NOTE — Telephone Encounter (Signed)
I am going to order a CBC with differential and IgE today when I see her.  I placed the orders now so she can come in early and get them done before our visit.  Called her to discuss and she is planning to come in before her OV

## 2023-07-13 NOTE — Assessment & Plan Note (Signed)
Asthma/allergies with a history of ABPA.  She is currently off Xolair, was able to come off because she was doing well.  We will check a eosinophil count and IgE today.  Could consider reinitiation Xolair at some point in the future depending on clinical course and her IgE level.  I think based on her bronchiectasis and CT we need to get adequate culture data.  Plan to continue Qvar, continue to treat rhinitis

## 2023-07-13 NOTE — Patient Instructions (Addendum)
Please continue Qvar twice a day you have been taking it.  Rinse and gargle after using. Keep albuterol available to use 2 puffs when needed for shortness of breath or to help with mucus clearance We will check eosinophil count and IgE today Continue your Flonase and your Allegra as you have been using them Use Mucinex as needed to help with mucus clearance Continue your flutter valve as you have been using it We will order sputum culture to be sent for bacterial, mycobacterial and fungal organisms.  If we are unable to get a good mucous sample then we will consider possible bronchoscopy for cultures.  We can talk more about this next visit We will plan to repeat your CT scan of the chest in December 2025.  We may decide to do so sooner depending on the clinical course. Follow with Dr. Delton Coombes next available appointment so we can review your cultures.

## 2023-07-13 NOTE — Addendum Note (Signed)
Addended by: Leslye Peer on: 07/13/2023 09:50 AM   Modules accepted: Orders

## 2023-07-13 NOTE — Assessment & Plan Note (Signed)
Use Mucinex as needed to help with mucus clearance Continue your flutter valve as you have been using it We will order sputum culture to be sent for bacterial, mycobacterial and fungal organisms.  If we are unable to get a good mucous sample then we will consider possible bronchoscopy for cultures.  We can talk more about this next visit We will plan to repeat your CT scan of the chest in December 2025.  We may decide to do so sooner depending on the clinical course. Follow with Dr. Delton Coombes next available appointment so we can review your cultures.

## 2023-07-14 LAB — IGE: IgE (Immunoglobulin E), Serum: 154 kU/L — ABNORMAL HIGH (ref <=114)

## 2023-08-29 ENCOUNTER — Other Ambulatory Visit: Payer: Self-pay | Admitting: Emergency Medicine

## 2023-09-15 ENCOUNTER — Other Ambulatory Visit: Payer: Self-pay | Admitting: Obstetrics and Gynecology

## 2023-09-15 DIAGNOSIS — E2839 Other primary ovarian failure: Secondary | ICD-10-CM

## 2023-12-06 ENCOUNTER — Ambulatory Visit (HOSPITAL_BASED_OUTPATIENT_CLINIC_OR_DEPARTMENT_OTHER)
Admission: RE | Admit: 2023-12-06 | Discharge: 2023-12-06 | Disposition: A | Discharge status: Home / Self Care | Source: Ambulatory Visit | Attending: Obstetrics and Gynecology | Admitting: Obstetrics and Gynecology

## 2023-12-06 DIAGNOSIS — E2839 Other primary ovarian failure: Secondary | ICD-10-CM | POA: Diagnosis present

## 2024-02-01 ENCOUNTER — Other Ambulatory Visit: Payer: Self-pay | Admitting: Obstetrics and Gynecology

## 2024-02-01 DIAGNOSIS — N644 Mastodynia: Secondary | ICD-10-CM

## 2024-02-12 ENCOUNTER — Encounter

## 2024-02-12 ENCOUNTER — Other Ambulatory Visit

## 2024-02-14 ENCOUNTER — Other Ambulatory Visit: Payer: Self-pay | Admitting: Obstetrics and Gynecology

## 2024-02-14 ENCOUNTER — Inpatient Hospital Stay
Admission: RE | Admit: 2024-02-14 | Discharge: 2024-02-14 | Discharge status: Home / Self Care | Source: Ambulatory Visit | Attending: Obstetrics and Gynecology

## 2024-02-14 ENCOUNTER — Ambulatory Visit
Admission: RE | Admit: 2024-02-14 | Discharge: 2024-02-14 | Disposition: A | Discharge status: Home / Self Care | Source: Ambulatory Visit | Attending: Obstetrics and Gynecology | Admitting: Obstetrics and Gynecology

## 2024-02-14 DIAGNOSIS — N644 Mastodynia: Secondary | ICD-10-CM

## 2024-02-14 DIAGNOSIS — N6321 Unspecified lump in the left breast, upper outer quadrant: Secondary | ICD-10-CM

## 2024-02-20 ENCOUNTER — Ambulatory Visit
Admission: RE | Admit: 2024-02-20 | Discharge: 2024-02-20 | Disposition: A | Discharge status: Home / Self Care | Source: Ambulatory Visit | Attending: Obstetrics and Gynecology | Admitting: Obstetrics and Gynecology

## 2024-02-20 DIAGNOSIS — N6321 Unspecified lump in the left breast, upper outer quadrant: Secondary | ICD-10-CM

## 2024-02-20 DIAGNOSIS — N644 Mastodynia: Secondary | ICD-10-CM

## 2024-02-20 HISTORY — PX: BREAST BIOPSY: SHX20

## 2024-02-21 LAB — SURGICAL PATHOLOGY

## 2024-04-18 ENCOUNTER — Encounter: Payer: Self-pay | Admitting: Family Medicine

## 2024-04-18 ENCOUNTER — Other Ambulatory Visit: Payer: Self-pay | Admitting: Family Medicine

## 2024-04-18 DIAGNOSIS — N2889 Other specified disorders of kidney and ureter: Secondary | ICD-10-CM

## 2024-04-24 ENCOUNTER — Other Ambulatory Visit

## 2024-05-01 ENCOUNTER — Inpatient Hospital Stay
Admission: RE | Admit: 2024-05-01 | Discharge: 2024-05-01 | Disposition: A | Source: Ambulatory Visit | Attending: Family Medicine

## 2024-05-01 DIAGNOSIS — N2889 Other specified disorders of kidney and ureter: Secondary | ICD-10-CM

## 2024-05-01 MED ORDER — IOPAMIDOL (ISOVUE-370) INJECTION 76%
100.0000 mL | Freq: Once | INTRAVENOUS | Status: AC | PRN
  Administered 2024-05-01: 100 mL via INTRAVENOUS

## 2024-05-20 ENCOUNTER — Other Ambulatory Visit

## 2024-07-23 ENCOUNTER — Other Ambulatory Visit: Payer: Self-pay | Admitting: Obstetrics and Gynecology

## 2024-07-23 DIAGNOSIS — N63 Unspecified lump in unspecified breast: Secondary | ICD-10-CM

## 2024-08-21 ENCOUNTER — Encounter

## 2024-08-21 ENCOUNTER — Other Ambulatory Visit
